# Patient Record
Sex: Male | Born: 1950 | Marital: Married | Smoking: Never smoker
Health system: Southern US, Community
[De-identification: ages and names within clinical notes are randomized; demographics above are authoritative.]

## PROBLEM LIST (undated history)

## (undated) DIAGNOSIS — R7989 Other specified abnormal findings of blood chemistry: Secondary | ICD-10-CM

## (undated) DIAGNOSIS — E291 Testicular hypofunction: Secondary | ICD-10-CM

## (undated) DIAGNOSIS — N529 Male erectile dysfunction, unspecified: Secondary | ICD-10-CM

## (undated) DIAGNOSIS — N400 Enlarged prostate without lower urinary tract symptoms: Secondary | ICD-10-CM

## (undated) DIAGNOSIS — Z8616 Personal history of COVID-19: Secondary | ICD-10-CM

## (undated) DIAGNOSIS — M1990 Unspecified osteoarthritis, unspecified site: Secondary | ICD-10-CM

## (undated) DIAGNOSIS — I10 Essential (primary) hypertension: Secondary | ICD-10-CM

## (undated) DIAGNOSIS — J189 Pneumonia, unspecified organism: Secondary | ICD-10-CM

## (undated) DIAGNOSIS — R339 Retention of urine, unspecified: Secondary | ICD-10-CM

## (undated) DIAGNOSIS — Z973 Presence of spectacles and contact lenses: Secondary | ICD-10-CM

## (undated) DIAGNOSIS — Z9889 Other specified postprocedural states: Secondary | ICD-10-CM

## (undated) DIAGNOSIS — E785 Hyperlipidemia, unspecified: Secondary | ICD-10-CM

## (undated) DIAGNOSIS — N401 Enlarged prostate with lower urinary tract symptoms: Secondary | ICD-10-CM

## (undated) HISTORY — DX: Other specified postprocedural states: Z98.890

## (undated) HISTORY — DX: Male erectile dysfunction, unspecified: N52.9

## (undated) HISTORY — PX: RETINAL DETACHMENT SURGERY: SHX105

## (undated) HISTORY — DX: Personal history of COVID-19: Z86.16

## (undated) HISTORY — PX: PROSTATE BIOPSY: SHX241

## (undated) HISTORY — PX: WISDOM TOOTH EXTRACTION: SHX21

## (undated) HISTORY — DX: Benign prostatic hyperplasia without lower urinary tract symptoms: N40.0

---

## 2017-12-07 DIAGNOSIS — D751 Secondary polycythemia: Secondary | ICD-10-CM

## 2017-12-07 HISTORY — DX: Secondary polycythemia: D75.1

## 2018-07-19 ENCOUNTER — Other Ambulatory Visit: Payer: Self-pay

## 2018-07-19 ENCOUNTER — Ambulatory Visit (HOSPITAL_COMMUNITY)
Admission: EM | Admit: 2018-07-19 | Discharge: 2018-07-19 | Disposition: A | Discharge status: Home / Self Care | Payer: Medicare HMO | Attending: Family Medicine | Admitting: Family Medicine

## 2018-07-19 ENCOUNTER — Encounter (HOSPITAL_COMMUNITY): Payer: Self-pay | Admitting: Emergency Medicine

## 2018-07-19 DIAGNOSIS — I10 Essential (primary) hypertension: Secondary | ICD-10-CM

## 2018-07-19 DIAGNOSIS — R030 Elevated blood-pressure reading, without diagnosis of hypertension: Secondary | ICD-10-CM

## 2018-07-19 HISTORY — DX: Essential (primary) hypertension: I10

## 2018-07-19 HISTORY — DX: Other specified abnormal findings of blood chemistry: R79.89

## 2018-07-19 NOTE — Discharge Instructions (Signed)
Increase dose of lisinopril from 5 mg to 10 mg daily Avoid foods high in salt and consuming excessive amounts of  alcohol.  It is also recommended to get 30 minutes of exercise daily.  DASH eating plan hand-out included.   Begin keeping a blood pressure log Follow up with PCP regarding further evaluation and management of hypertension and low testosterone Return or go to the ED if you have any new or worsening symptoms

## 2018-07-19 NOTE — ED Triage Notes (Signed)
pcp has been treating htn for 8 months.    Patient was prescribed bp med and t gel.  Patient has noticed blood pressure climbing over the last week.

## 2018-07-19 NOTE — ED Provider Notes (Signed)
[facility]   [id number] 07/19/18 Arrival Time: 1806  SUBJECTIVE:  Sergio [name] is a 67 y.o. male who complains of elevated blood pressure x 2 weeks.  Hx of hypertension and low testosterone.  Currently on 5 mg of lisinopril and t-gel.  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 Patient increased dose of lisinopril to 10 mg daily and ran out of t-gel.  Checked BP last Thursday and was 150/98.  Today BP 150/103.  Patient complains of associated headache.  Denies vision changes, chest pain, SOB, peripheral edema, numbness or tingling of the extremities.     ROS: As per HPI.  Past Medical History:  Diagnosis Date  . Hypertension   . Low testosterone    Past Surgical History:  Procedure Laterality Date  . RETINAL DETACHMENT SURGERY Right    No Known Allergies No current facility-administered medications on file prior to encounter.    Current Outpatient Medications on File Prior to Encounter  Medication Sig Dispense Refill  . lisinopril (PRINIVIL,ZESTRIL) 5 MG tablet Take 5 mg by mouth daily.    . NON FORMULARY Testosterone gel    . testosterone (ANDROGEL) 50 MG/5GM (1%) GEL Place onto the skin daily. Testosterone gel     Social History   Socioeconomic History  . Marital status: Unknown    Spouse name: Not on file  . Number of children: Not on file  . Years of education: Not on file  . Highest education level: Not on file  Occupational History  . Not on file  Social Needs  . Financial resource strain: Not on file  . Food insecurity:    Worry: Not on file    Inability: Not on file  . Transportation needs:    Medical: Not on file    Non-medical: Not on file  Tobacco Use  . Smoking status: Never Smoker  Substance and Sexual Activity  . Alcohol use: Never    Frequency: Never  . Drug use: Never  . Sexual activity: Not on file  Lifestyle  . Physical activity:    Days per week: Not on file    Minutes per  session: Not on file  . Stress: Not on file  Relationships  . Social connections:    Talks on phone: Not on file    Gets together: Not on file    Attends religious service: Not on file    Active member of club or organization: Not on file    Attends meetings of clubs or organizations: Not on file    Relationship status: Not on file  . Intimate partner violence:    Fear of current or ex partner: Not on file    Emotionally abused: Not on file    Physically abused: Not on file    Forced sexual activity: Not on file  Other Topics Concern  . Not on file  Social History Narrative  . Not on file   Family History  Problem Relation Age of Onset  . Heart failure Mother   . Heart failure Father     OBJECTIVE:  Vitals:   07/19/18 1835 07/19/18 1913  BP: (!) 152/103 (!) 151/100  Pulse: 67   Resp: 18   Temp: 97.7 F (36.5 C)   TempSrc: Oral   SpO2: 96%     General appearance: alert; no distress Eyes: PERRLA; EOMI HENT: normocephalic; atraumatic Neck: supple with FROM Lungs: clear to auscultation bilaterally Heart:  regular rate and rhythm.  Radial pulses 2+ symmetrical bilaterally Extremities: no edema; symmetrical with no gross deformities Skin: warm and dry Neurologic: CN 2-12 grossly intact; negative pronator drift; finger to nose without difficulty; normal gait; strength and sensation intact bilaterally about the upper and lower extremities Psychological: alert and cooperative; anxious mood and affect  ASSESSMENT & PLAN:  1. Essential hypertension   2. Elevated blood pressure reading     No orders of the defined types were placed in this encounter.  Increase dose of lisinopril from 5 mg to 10 mg daily Avoid foods high in salt and consuming excessive amounts of  alcohol.  It is also recommended to get 30 minutes of exercise daily.  DASH eating plan hand-out included.   Begin keeping a blood pressure log Follow up with PCP regarding further evaluation and management of  hypertension and low testosterone Return or go to the ED if you have any new or worsening symptoms  Reviewed expectations re: course of current medical issues. Questions answered. Outlined signs and symptoms indicating need for more acute intervention. Patient verbalized understanding. After Visit Summary given.   Rennis HardingWurst, Jimy Gates, PA-C 07/19/18 1918

## 2018-09-29 ENCOUNTER — Encounter: Payer: Self-pay | Admitting: Hematology and Oncology

## 2018-09-29 ENCOUNTER — Telehealth: Payer: Self-pay | Admitting: Hematology and Oncology

## 2018-09-29 NOTE — Telephone Encounter (Signed)
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 Pt has been cld and scheduled to see Dr. Caron Presume on 11/18 at 11am. Letter mailed to the pt.

## 2018-10-24 ENCOUNTER — Telehealth: Payer: Self-pay | Admitting: Hematology and Oncology

## 2018-10-24 ENCOUNTER — Inpatient Hospital Stay: Payer: Medicare HMO

## 2018-10-24 ENCOUNTER — Inpatient Hospital Stay: Payer: Medicare HMO | Attending: Hematology and Oncology | Admitting: Hematology and Oncology

## 2018-10-24 VITALS — BP 142/96 | HR 66 | Temp 98.1°F | Resp 18 | Wt 181.1 lb

## 2018-10-24 DIAGNOSIS — Z79899 Other long term (current) drug therapy: Secondary | ICD-10-CM | POA: Diagnosis not present

## 2018-10-24 DIAGNOSIS — M1990 Unspecified osteoarthritis, unspecified site: Secondary | ICD-10-CM | POA: Insufficient documentation

## 2018-10-24 DIAGNOSIS — E039 Hypothyroidism, unspecified: Secondary | ICD-10-CM | POA: Diagnosis not present

## 2018-10-24 DIAGNOSIS — D751 Secondary polycythemia: Secondary | ICD-10-CM | POA: Diagnosis present

## 2018-10-24 DIAGNOSIS — I10 Essential (primary) hypertension: Secondary | ICD-10-CM | POA: Diagnosis not present

## 2018-10-24 LAB — CBC WITH DIFFERENTIAL (CANCER CENTER ONLY)
Abs Immature Granulocytes: 0.04 10*3/uL (ref 0.00–0.07)
Basophils Absolute: 0 10*3/uL (ref 0.0–0.1)
Basophils Relative: 0 %
Eosinophils Absolute: 0.1 10*3/uL (ref 0.0–0.5)
Eosinophils Relative: 1 %
HCT: 55.5 % — ABNORMAL HIGH (ref 39.0–52.0)
Hemoglobin: 19.3 g/dL — ABNORMAL HIGH (ref 13.0–17.0)
Immature Granulocytes: 1 %
Lymphocytes Relative: 29 %
Lymphs Abs: 2.3 10*3/uL (ref 0.7–4.0)
MCH: 29.3 pg (ref 26.0–34.0)
MCHC: 34.8 g/dL (ref 30.0–36.0)
MCV: 84.2 fL (ref 80.0–100.0)
Monocytes Absolute: 0.5 10*3/uL (ref 0.1–1.0)
Monocytes Relative: 7 %
Neutro Abs: 5 10*3/uL (ref 1.7–7.7)
Neutrophils Relative %: 62 %
Platelet Count: 174 10*3/uL (ref 150–400)
RBC: 6.59 MIL/uL — ABNORMAL HIGH (ref 4.22–5.81)
RDW: 14.2 % (ref 11.5–15.5)
WBC Count: 8 10*3/uL (ref 4.0–10.5)
nRBC: 0 % (ref 0.0–0.2)

## 2018-10-24 LAB — COMPREHENSIVE METABOLIC PANEL
ALT: 31 U/L (ref 0–44)
AST: 26 U/L (ref 15–41)
Albumin: 3.9 g/dL (ref 3.5–5.0)
Alkaline Phosphatase: 52 U/L (ref 38–126)
Anion gap: 10 (ref 5–15)
BUN: 16 mg/dL (ref 8–23)
CO2: 24 mmol/L (ref 22–32)
Calcium: 9.2 mg/dL (ref 8.9–10.3)
Chloride: 100 mmol/L (ref 98–111)
Creatinine, Ser: 1.05 mg/dL (ref 0.61–1.24)
GFR calc Af Amer: >60 mL/min (ref >60)
GFR calc non Af Amer: >60 mL/min (ref >60)
Glucose, Bld: 102 mg/dL — ABNORMAL HIGH (ref 70–99)
Potassium: 3.5 mmol/L (ref 3.5–5.1)
Sodium: 134 mmol/L — ABNORMAL LOW (ref 135–145)
Total Bilirubin: 1.5 mg/dL — ABNORMAL HIGH (ref 0.3–1.2)
Total Protein: 7.1 g/dL (ref 6.5–8.1)

## 2018-10-24 NOTE — Patient Instructions (Signed)
We discussed the results of your prior laboratory studies from August.  At that time her hemoglobin was elevated.  Repeat laboratory studies were requested for today to include a complete blood count, metabolic panel, and testosterone level.  Continue to take testosterone gel as previously directed (one half the dose).  Barring any unforeseen complications, your next scheduled doctor visit to discuss those results and possible treatment is on November 07, 2018.  Please do not hesitate to call in the interim should any new or untoward problems arise.  Happy Thanksgiving! Debbe Odeaichard Ruben MD

## 2018-10-24 NOTE — Progress Notes (Signed)
Outpatient Hematology/Oncology Initial Consultation  Patient Name:  Sergio [name]  DOB: 1951-08-27   Date of Service: October 24, 2018  Referring Provider: Aida Puffer MD [address] 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  History Present Illness: Sergio [name] is a 67 year old resident of [place] this past medical history is significant for primary hypertension; degenerative joint disease involving the fingers; and hypogonadism/low testosterone.  His primary care physician is Dr. Aida Puffer.  He is alone at this first visit.  Since February, he has been on AndroGel 1 mg daily.  Sukhraj was aware that his hemoglobin was elevated nearly 3 months ago.  At that time, on August 01, 2018: A complete blood count showed hemoglobin 19.3 hematocrit 56.5 MCV 85.7 MCH 29.3 RDW 15.2 WBC 7.8 with 60% neutrophils 30% lymphocytes 7% monocytes 2% eosinophils 1% basophil; platelets 172,000.  Vitamin B12 1654.  Serum iron 94 TIBC 349 iron saturation 27%. TSH 1.8. Total testosterone 441.He has no peripheral arterial venous thromboembolic disease. He has no coronary artery or cerebrovascular disease.   Antwoine denies any appetite or weight deficit.  He has no visual changes or hearing deficit.  There is no cough, sore throat, or orthopnea.  He reports no dyspnea either at rest or on exertion.  He has no pain or difficulty in swallowing.  No fever, shaking chills, sweats, or flulike symptoms are evident.  He denies heartburn or indigestion.  There is no nausea, vomiting, diarrhea, or constipation.  He has no melena or bright red blood per rectum.  No urinary frequency, urgency, hematuria, dysuria are reported.  Has no swelling of his ankles.  He is chronic but stable degenerative joint pain  in his fingers over the past year.  There is no blood disorder or bleeding tendency.   There is with this background he presents now for further diagnostic and therapeutic recommendations in the setting of erythrocytosis as outlined above.  Past Medical History:  Diagnosis Date  . Hypertension   . Low testosterone    Past Surgical History:  Procedure Laterality Date  . RETINAL DETACHMENT SURGERY Right    Family History  Problem Relation Age of Onset  . Heart failure Mother   . Heart failure Father   Mother: Deceased: Age 9 years: CHF Father: Deceased: 51s: CHF/HTN He has no brothers. Sisters (2): DJD/HTN  Social History   Socioeconomic History  . Marital status: Unknown    Spouse name: Not on file  . Number of children: Not on file  . Years of education: Not on file  . Highest education level: Not on file  Occupational History  . Not on file  Social Needs  . Financial resource strain: Not on file  . Food insecurity:    Worry: Not on file    Inability: Not on file  . Transportation needs:    Medical: Not on file    Non-medical: Not on file  Tobacco Use  . Smoking status: Never Smoker  Substance and Sexual Activity  . Alcohol use: Never    Frequency: Never  . Drug use: Never  . Sexual activity: Not on file  Lifestyle  . Physical activity:    Days per week: Not on file    Minutes per session: Not on file  . Stress: Not on file  Relationships  .  Social connections:    Talks on phone: Not on file    Gets together: Not on file    Attends religious service: Not on file    Active member of club or organization: Not on file    Attends meetings of clubs or organizations: Not on file    Relationship status: Not on file  . Intimate partner violence:    Fear of current or ex partner: Not on file    Emotionally abused: Not on file    Physically abused: Not on file    Forced sexual activity: Not on file  Other Topics Concern  . Not on file  Social History Narrative    . Not on file  Although retired, he is an Engineer, site. Keagen has been married for the past 44 years. He has 2 children. With the exception of hypothyroid disease, his children are healthy. He is a lifetime non-smoker. In his 69s he was a social drinker. He stopped drinking completely 45 years ago.   He reports no recreational drug use.  Transfusion History: No prior transfusion  Exposure History: He has no known exposure to toxic chemicals, radiation, or pesticides  Allergies:  He has no known medical allergies He has no food or seasonal allergies  Current Outpatient Medications on File Prior to Visit  Medication Sig  . hydrochlorothiazide (HYDRODIURIL) 25 MG tablet TAKE 1 TABLET BY MOUTH DAILY WITH LISINOPRIL  . lisinopril (PRINIVIL,ZESTRIL) 10 MG tablet Take 10 mg by mouth daily.  . Multiple Vitamin (MULTIVITAMIN) tablet Take 4 tablets by mouth daily.  . NON FORMULARY Testosterone gel  . testosterone (ANDROGEL) 50 MG/5GM (1%) GEL Place onto the skin daily. Testosterone gel   No current facility-administered medications on file prior to visit.     Review of Systems: Constitutional: No fever, sweats, or shaking chills. No appetite or weight deficit. Skin: No rash, scaling, sores, lumps, or jaundice; plethoric facies. HEENT: No visual changes or hearing deficit. Pulmonary: No unusual cough, sore throat, or orthopnea. Cardiovascular: No coronary artery disease, angina, or myocardial infarction.  No cardiac dysrhythmia. essential hypertension.  No dyslipidemia. Gastrointestinal: No indigestion, dysphagia, abdominal pain, diarrhea, or constipation.  No change in bowel habits. Genitourinary: No urinary frequency, urgency, hematuria, or dysuria; hypogonadism. Musculoskeletal: No new arthralgias or myalgias; no joint swelling, pain, or instability.  Chronic painful fingers for the past 12 months. Hematologic: No bleeding tendency or easy bruisability; secondary  erythrocytosis. Endocrine: No intolerance to hot or cold; no thyroid disease or diabetes mellitus. Vascular: No peripheral arterial or venous thromboembolic disease. Psychological: No anxiety, depression, or mood changes; no mental health illnesses. Neurological: No dizziness, lightheadedness, syncope, or near syncopal episodes; no numbness or tingling in the fingers or toes.  Physical Examination: Vital Signs: There is no height or weight on file to calculate BSA.  Vitals:   10/24/18 1145  BP: (!) 142/96  Pulse: 66  Resp: 18  Temp: 98.1 F (36.7 C)  SpO2: 99%    Filed Weights   10/24/18 1145  Weight: 181 lb 1.6 oz (82.1 kg)  ECOG PERFORMANCE STATUS: 0 Constitutional:  Mandel Seiden is fully nourished and developed.  He looks age appropriate.  He is friendly and cooperative without respiratory compromise at rest. Skin: No rashes, scaling, dryness, jaundice, or itching.  His face and chest wall are plethoric. HEENT: Head is normocephalic and atraumatic.  Pupils are equal round and reactive to light and accommodation.  Sclerae are anicteric.  Conjunctivae are pink.  No sinus  tenderness nor oropharyngeal lesions.  Lips without cracking or peeling; tongue without mass, inflammation, or nodularity.  Mucous membranes are moist. Neck: Supple and symmetric.  No jugular venous distention or thyromegaly.  Trachea is midline. Lymphatics: No cervical or supraclavicular lymphadenopathy.  No epitrochlear, axillary, or inguinal lymphadenopathy is appreciated. Respiratory/chest: Thorax is symmetrical.  Breath sounds are clear to auscultation and percussion.  Normal excursion and respiratory effort. Back: Symmetric without deformity or tenderness. Cardiovascular: Heart rate and rhythm are regular without murmurs. Gastrointestinal: Abdomen is soft, nontender; no organomegaly.  Bowel sounds are normoactive.  No masses are appreciated. Genitourinary: Normal external male genitalia. Rectal examination:  Not performed. Extremities: In the lower extremities, there is no asymmetric swelling, erythema, tenderness, or cord formation.  No clubbing, cyanosis, nor edema. Hematologic: No petechiae, hematomas, or ecchymoses. Psychological:  He is oriented to person, place, and time; normal affect, memory, and cognition. Neurological: There are no gross neurologic deficits.  Laboratory Results: I have reviewed the data as listed: October 24, 2018  Ref Range & Units 13:10  WBC Count 4.0 - 10.5 K/uL 8.0   RBC 4.22 - 5.81 MIL/uL 6.59High    Hemoglobin 13.0 - 17.0 g/dL 19.3High    HCT 39.0 - 52.0 % 55.5High    MCV 80.0 - 100.0 fL 84.2   MCH 26.0 - 34.0 pg 29.3   MCHC 30.0 - 36.0 g/dL 29.5   RDW 62.1 - 30.8 % 14.2   Platelet Count 150 - 400 K/uL 174   nRBC 0.0 - 0.2 % 0.0   Neutrophils Relative % % 62   Neutro Abs 1.7 - 7.7 K/uL 5.0   Lymphocytes Relative % 29   Lymphs Abs 0.7 - 4.0 K/uL 2.3   Monocytes Relative % 7   Monocytes Absolute 0.1 - 1.0 K/uL 0.5   Eosinophils Relative % 1   Eosinophils Absolute 0.0 - 0.5 K/uL 0.1   Basophils Relative % 0   Basophils Absolute 0.0 - 0.1 K/uL 0.0   Immature Granulocytes % 1   Abs Immature Granulocytes 0.00 - 0.07 K/uL 0.04     Ref Range & Units 13:10  Sodium 135 - 145 mmol/L 134Low    Potassium 3.5 - 5.1 mmol/L 3.5   Chloride 98 - 111 mmol/L 100   CO2 22 - 32 mmol/L 24   Glucose, Bld 70 - 99 mg/dL 657QION    BUN 8 - 23 mg/dL 16   Creatinine, Ser 6.29 - 1.24 mg/dL 5.28   Calcium 8.9 - 41.3 mg/dL 9.2   Total Protein 6.5 - 8.1 g/dL 7.1   Albumin 3.5 - 5.0 g/dL 3.9   AST 15 - 41 U/L 26   ALT 0 - 44 U/L 31   Alkaline Phosphatase 38 - 126 U/L 52   Total Bilirubin 0.3 - 1.2 mg/dL 2.4MWNU    GFR calc non Af Amer >60 mL/min >60   GFR calc Af Amer >60 mL/min >60    Summary/Assessment: In the setting of erythrocytosis, 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  Under the  direction of Dr. Clarene Duke, on August 01, 2018: A complete blood count showed hemoglobin 19.3 hematocrit 56.5 MCV 85.7 MCH 29.3 RDW 15.2 WBC 7.8 with 60% neutrophils 30% lymphocytes 7% monocytes 2% eosinophils 1% basophil; platelets 172,000.  Vitamin B12 1654.  Serum iron 94 TIBC 349 iron saturation 27%. TSH 1.8. Total testosterone 441.He has no peripheral arterial venous thromboembolic disease. He has no coronary artery or cerebrovascular  disease. He has no visual changes or hearing deficit.  He has no bleeding tendency.    Chrissie NoaWilliam denies any appetite or weight deficit. He denies headache, dizziness, lightheadedness, syncope, or near syncopal episodes.   There is no cough, sore throat, or orthopnea.  He reports no dyspnea either at rest or on exertion.  He has no pain or difficulty in swallowing.  No fever, shaking chills, sweats, or flulike symptoms are evident.  He denies heartburn or indigestion.  There is no nausea, vomiting, diarrhea, or constipation.  He has no melena or bright red blood per rectum.  No urinary frequency, urgency, hematuria, dysuria are reported.  He has no swelling of his ankles.  He has chronic but stable degenerative joint pain in his fingers over the past year.  There is no blood disorder or bleeding tendency.  He denies numbness or tingling in his fingers or toes.  His other comorbid problems include primary hypertension; degenerative joint disease involving the fingers; and hypogonadism/low testosterone. Since February, he has been on AndroGel 1 mg daily.  He was instructed to reduce the AndroGel by 50%.  Chrissie NoaWilliam was aware that his hemoglobin was elevated nearly 3 months ago.    Recommendation/Plan: The results of his laboratory studies were not available at the time of discharge.  They will be discussed in detail at the time of his next visit.  A repeat complete blood count, testosterone level, and erythropoietin level are pending.  The indication for therapeutic phlebotomy and  secondary erythrocytosis is somewhat dependent upon the symptoms provoked.  In general, it is recommended that he continue aspirin: 81 mg once daily.  Because he recently was instructed to reduce the dose of AndroGel by 50%, this may favorably affect his erythrocytosis.   Barring any unforeseen complications, his next scheduled doctor visit to discuss these results with recommendations is on December 2.  He was advised to call us in the interim should any new or untoward problems arise.  The total time spent discussing his most recent laboratory studies from August, physical examination, and methodology for evaluating erythrocytosis with recommendations was 50 minutes. At least 50% of that time was spent in face to face discussion, counseling, and answering questions. There was ample time allotted to answer all questions.  This note was dictated using voice activated technology/software.  Unfortunately, typographical errors are not uncommon, and transcription is subject to mistakes and regrettably misinterpretation.  If necessary, clarification of the above information can be discussed with me at any time.  Thank you Dr. Clarene DukeLittle for allowing my participation in the care of [facility].  Please do not hesitate to call should any questions arise regarding this initial evaluation and discussion.  FOLLOW UP: AS DIRECTED  cc:          Aida PufferJames Little MD       Toni Arthursichard H Seamus Warehime, MD  Hematology/Oncology [facility] [address] [place], [place] [address] Office: [phone] Main: [phone]

## 2018-10-24 NOTE — Telephone Encounter (Signed)
Printed calendar and avs. °

## 2018-10-25 DIAGNOSIS — D751 Secondary polycythemia: Secondary | ICD-10-CM | POA: Insufficient documentation

## 2018-10-25 LAB — ERYTHROPOIETIN: Erythropoietin: 11.3 m[IU]/mL (ref 2.6–18.5)

## 2018-10-26 LAB — TESTOSTERONE,FREE AND TOTAL
Testosterone, Free: 7.2 pg/mL (ref 6.6–18.1)
Testosterone: 295 ng/dL (ref 264–916)

## 2018-11-06 ENCOUNTER — Other Ambulatory Visit: Payer: Self-pay | Admitting: Hematology and Oncology

## 2018-11-06 DIAGNOSIS — D751 Secondary polycythemia: Secondary | ICD-10-CM

## 2018-11-07 ENCOUNTER — Encounter: Payer: Self-pay | Admitting: Hematology and Oncology

## 2018-11-07 ENCOUNTER — Inpatient Hospital Stay: Payer: Medicare HMO

## 2018-11-07 ENCOUNTER — Inpatient Hospital Stay: Payer: Medicare HMO | Attending: Hematology and Oncology | Admitting: Hematology and Oncology

## 2018-11-07 VITALS — BP 141/98 | HR 74 | Temp 98.6°F | Resp 17 | Ht 69.0 in | Wt 182.4 lb

## 2018-11-07 DIAGNOSIS — M1990 Unspecified osteoarthritis, unspecified site: Secondary | ICD-10-CM | POA: Insufficient documentation

## 2018-11-07 DIAGNOSIS — D751 Secondary polycythemia: Secondary | ICD-10-CM | POA: Insufficient documentation

## 2018-11-07 DIAGNOSIS — Z79899 Other long term (current) drug therapy: Secondary | ICD-10-CM | POA: Diagnosis not present

## 2018-11-07 DIAGNOSIS — E291 Testicular hypofunction: Secondary | ICD-10-CM | POA: Insufficient documentation

## 2018-11-07 DIAGNOSIS — I10 Essential (primary) hypertension: Secondary | ICD-10-CM | POA: Insufficient documentation

## 2018-11-07 LAB — CBC WITH DIFFERENTIAL (CANCER CENTER ONLY)
Abs Immature Granulocytes: 0.04 10*3/uL (ref 0.00–0.07)
Basophils Absolute: 0 10*3/uL (ref 0.0–0.1)
Basophils Relative: 0 %
Eosinophils Absolute: 0.1 10*3/uL (ref 0.0–0.5)
Eosinophils Relative: 1 %
HCT: 56.8 % — ABNORMAL HIGH (ref 39.0–52.0)
Hemoglobin: 19.9 g/dL — ABNORMAL HIGH (ref 13.0–17.0)
Immature Granulocytes: 1 %
Lymphocytes Relative: 26 %
Lymphs Abs: 2.1 10*3/uL (ref 0.7–4.0)
MCH: 29.3 pg (ref 26.0–34.0)
MCHC: 35 g/dL (ref 30.0–36.0)
MCV: 83.5 fL (ref 80.0–100.0)
Monocytes Absolute: 0.6 10*3/uL (ref 0.1–1.0)
Monocytes Relative: 7 %
Neutro Abs: 5.4 10*3/uL (ref 1.7–7.7)
Neutrophils Relative %: 65 %
Platelet Count: 186 10*3/uL (ref 150–400)
RBC: 6.8 MIL/uL — ABNORMAL HIGH (ref 4.22–5.81)
RDW: 14.4 % (ref 11.5–15.5)
WBC Count: 8.3 10*3/uL (ref 4.0–10.5)
nRBC: 0 % (ref 0.0–0.2)

## 2018-11-07 LAB — BILIRUBIN, FRACTIONATED(TOT/DIR/INDIR)
Bilirubin, Direct: 0.2 mg/dL (ref 0.0–0.2)
Indirect Bilirubin: 1.2 mg/dL — ABNORMAL HIGH (ref 0.3–0.9)
Total Bilirubin: 1.4 mg/dL — ABNORMAL HIGH (ref 0.3–1.2)

## 2018-11-07 NOTE — Patient Instructions (Signed)
We discussed in detail the results of her laboratory studies from November 18.  Those results confirm the presence of a markedly elevated red blood cell volume (increased hemoglobin) in spite of the fact that she decreased dose of AndroGel 3 months ago..  Your testosterone level is normal.  Because your total bilirubin was mildly elevated (1.5), it will be repeated with fractionation of total bilirubin into direct/indirect components.  Those results will be discussed the time of your next visit.  Start aspirin: 81 mg once daily because of the increased red blood cell mass/volume.  Morning a little.  Laboratory studies will be obtained today to attempt to identify an underlying bone marrow disorder.  Because your red cell mass is increased relative to the plasma in your vascular compartment, hydrochlorothiazide may be contributing in small part to your increased red blood cell concentration.  You should consider discussing this with Dr. Clarene DukeLittle including your improving, but still elevated blood pressure (141/98).  If not already considered, a home ambulatory blood pressure cuff is strongly recommended with self-monitoring.   A therapeutic phlebotomy will be scheduled for Friday, December 6.  Barring any unforeseen complications, your next scheduled doctor visit with laboratory studies is on December 16.  Please do not hesitate to call in the interim should any new or untoward problems arise.  All the best of good wishes through the holiday season!

## 2018-11-07 NOTE — Progress Notes (Signed)
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  His primary care physician is Dr. Tamsen Roers.  He is alone at this visit.  Since February, he was started on AndroGel daily.  Sergio [name] was aware that his hemoglobin was elevated nearly 3 months ago. His dose of AndroGel was decreased by 50% in August to AndroGel: 50 mg once daily.  At that time, on August 01, 2018: A complete blood count showed hemoglobin 19.3 hematocrit 56.5 MCV 85.7 MCH 29.3 RDW 15.2 WBC 7.8 with 60% neutrophils 30% lymphocytes 7% monocytes 2% eosinophils 1% basophil; platelets 172,000.  Vitamin B12 1654.  Serum iron 94 TIBC 349 iron saturation 27%. TSH 1.8. Total testosterone 441. He has no prior peripheral arterial or venous thromboembolic disease. He has no known coronary artery or cerebrovascular disease.  Because of his elevated red blood cell volume, 3 months ago, his antral gel was decreased by 50%.  He now uses AndroGel: 0.5 mg daily.  Although his blood pressure remains elevated, it has improved since August.  He continues on lisinopril/hydrochlorothiazide.  He is not taking aspirin.  Interval History: In the interim since his last visit, he reports no new problems or complaints.  He denies any appetite or weight deficit.  He has no visual changes or hearing deficit. He has no unusual headache,  dizziness, lightheadedness, syncope, or near syncopal episodes.  He is taking no new medications.  There is no cough, sore throat, or orthopnea.  He reports no dyspnea either at rest or on exertion.  He has no pain or difficulty in swallowing.  No fever, shaking chills, sweats, or flulike symptoms are evident.  He has occasional dyspepsia at night.  There is no nausea, vomiting, diarrhea, or constipation.  He has no melena or bright red blood per rectum.  No urinary frequency, urgency, hematuria, or dysuria are reported.  He has no swelling of his ankles.  He has chronic but stable degenerative joint pain in his fingers over the past year.  There is no known blood disorder or bleeding tendency in his immediate family.     Past Medical History:  Diagnosis Date  . Hypertension   . Low testosterone    Past Surgical History:  Procedure Laterality Date  . RETINAL DETACHMENT SURGERY Right    Allergies:  He has no known medical allergies He has no food or seasonal allergies  Current Outpatient Medications on File Prior to Visit  Medication Sig  . hydrochlorothiazide (HYDRODIURIL) 25 MG tablet TAKE 1 TABLET BY MOUTH DAILY WITH LISINOPRIL  . lisinopril (PRINIVIL,ZESTRIL) 10 MG tablet Take 10 mg by mouth daily.  . Multiple Vitamin (MULTIVITAMIN) tablet Take 4 tablets by mouth daily.  . NON FORMULARY Testosterone gel  . testosterone (ANDROGEL) 50 MG/5GM (1%) GEL Place onto the skin daily. Testosterone gel   No current facility-administered medications on file prior to visit.     Review of Systems: Constitutional: No fever, sweats, or shaking chills. No appetite or  weight deficit. Skin: No rash, scaling, sores, lumps, or jaundice; plethoric facies. HEENT: No visual changes or hearing deficit. Pulmonary: No unusual cough, sore throat, or orthopnea. Cardiovascular: No coronary artery disease, angina, or myocardial infarction.  No cardiac dysrhythmia. essential hypertension.  No  dyslipidemia. Gastrointestinal: No indigestion, dysphagia, abdominal pain, diarrhea, or constipation.  No change in bowel habits. Genitourinary: No urinary frequency, urgency, hematuria, or dysuria; hypogonadism. Musculoskeletal: No new arthralgias or myalgias; no joint swelling, pain, or instability.  Chronic painful fingers for the past 12 months. Hematologic: No bleeding tendency or easy bruisability; secondary erythrocytosis. Endocrine: No intolerance to hot or cold; no thyroid disease or diabetes mellitus. Vascular: No peripheral arterial or venous thromboembolic disease. Psychological: No anxiety, depression, or mood changes; no mental health illnesses. Neurological: No dizziness, lightheadedness, syncope, or near syncopal episodes; no numbness or tingling in the fingers or toes.  Physical Examination: Vital Signs: Body surface area is 2.01 meters squared.  Vitals:   11/07/18 0856  BP: (!) 141/98  Pulse: 74  Resp: 17  Temp: 98.6 F (37 C)  SpO2: 96%    Filed Weights   11/07/18 0856  Weight: 182 lb 6.4 oz (82.7 kg)  ECOG PERFORMANCE STATUS: 0 Constitutional:  Sergio [name] is fully nourished and developed.  He looks age appropriate.  He is friendly and cooperative without respiratory compromise at rest. Skin: No rashes, scaling, dryness, jaundice, or itching.  His face and chest wall are plethoric. HEENT: Head is normocephalic and atraumatic.  Pupils are equal round and reactive to light and accommodation.  Sclerae are anicteric.  Conjunctivae are pink.  No sinus tenderness nor oropharyngeal lesions.  Lips without cracking or peeling; tongue without mass, inflammation, or nodularity.  Mucous membranes are moist. Neck: Supple and symmetric.  No jugular venous distention or thyromegaly.  Trachea is midline. Lymphatics: No cervical or supraclavicular lymphadenopathy.  No epitrochlear, axillary, or inguinal lymphadenopathy is appreciated. Respiratory/chest: Thorax is symmetrical.   Breath sounds are clear to auscultation and percussion.  Normal excursion and respiratory effort. Back: Symmetric without deformity or tenderness. Cardiovascular: Heart rate and rhythm are regular without murmurs. Gastrointestinal: Abdomen is soft, nontender; no organomegaly.  Bowel sounds are normoactive.  No masses are appreciated. Extremities: In the lower extremities, there is no asymmetric swelling, erythema, tenderness, or cord formation.  No clubbing, cyanosis, nor edema. Hematologic: No petechiae, hematomas, or ecchymoses. Psychological:  He is oriented to person, place, and time; normal affect, memory, and cognition. Neurological: There are no gross neurologic deficits.  Laboratory Results: I have reviewed the data as listed: October 24, 2018  Ref Range & Units 13:10  WBC Count 4.0 - 10.5 K/uL 8.0   RBC 4.22 - 5.81 MIL/uL 6.59High    Hemoglobin 13.0 - 17.0 g/dL 19.3High    HCT 39.0 - 52.0 % 55.5High    MCV 80.0 - 100.0 fL 84.2   MCH 26.0 - 34.0 pg 29.3   MCHC 30.0 - 36.0 g/dL 34.8   RDW 11.5 - 15.5 % 14.2   Platelet Count 150 - 400 K/uL 174   nRBC 0.0 - 0.2 % 0.0   Neutrophils Relative % % 62   Neutro Abs 1.7 - 7.7 K/uL 5.0   Lymphocytes Relative % 29   Lymphs Abs 0.7 - 4.0 K/uL 2.3   Monocytes Relative % 7   Monocytes Absolute 0.1 - 1.0 K/uL 0.5   Eosinophils Relative % 1   Eosinophils Absolute 0.0 - 0.5 K/uL 0.1   Basophils Relative % 0  Basophils Absolute 0.0 - 0.1 K/uL 0.0   Immature Granulocytes % 1   Abs Immature Granulocytes 0.00 - 0.07 K/uL 0.04     Ref Range & Units 13:10  Sodium 135 - 145 mmol/L 134Low    Potassium 3.5 - 5.1 mmol/L 3.5   Chloride 98 - 111 mmol/L 100   CO2 22 - 32 mmol/L 24   Glucose, Bld 70 - 99 mg/dL 102High    BUN 8 - 23 mg/dL 16   Creatinine, Ser 0.61 - 1.24 mg/dL 1.05   Calcium 8.9 - 10.3 mg/dL 9.2   Total Protein 6.5 - 8.1 g/dL 7.1   Albumin 3.5 - 5.0 g/dL 3.9   AST 15 - 41 U/L 26   ALT 0 - 44 U/L 31   Alkaline Phosphatase 38  - 126 U/L 52   Total Bilirubin 0.3 - 1.2 mg/dL 1.5High    GFR calc non Af Amer >60 mL/min >60   GFR calc Af Amer >60 mL/min >60   Erythropoietin 11.3 Total testosterone 295 Free testosterone 7.2  Summary/Assessment: Since February, he was started on AndroGel daily.  Sergio [name] was aware that his hemoglobin was elevated nearly 3 months ago. His dose of AndroGel was decreased by 50% in August to AndroGel: 50 mg once daily. On August 01, 2018: A complete blood count showed hemoglobin 19.3 hematocrit 56.5 MCV 85.7 MCH 29.3 RDW 15.2 WBC 7.8 with 60% neutrophils 30% lymphocytes 7% monocytes 2% eosinophils 1% basophil; platelets 172,000.  Vitamin B12 1654.  Serum iron 94 TIBC 349 iron saturation 27%. TSH 1.8. Total testosterone 441. He has no prior peripheral arterial or venous thromboembolic disease. He has no known coronary artery or cerebrovascular disease.  Because of his elevated red blood cell volume, 3 months ago, his antral gel was decreased by 50%.  He now uses AndroGel: 50 mg daily.  Although his blood pressure remains elevated, it has improved since August.  He continues on lisinopril/hydrochlorothiazide.  He is not taking aspirin.  In the interim since his last visit, he reports no new problems or complaints.  He denies any appetite or weight deficit.  He has no visual changes or hearing deficit. He has no unusual headache, dizziness, lightheadedness, syncope, or near syncopal episodes.  He is taking no new medications.  There is no cough, sore throat, or orthopnea.  He reports no dyspnea either at rest or on exertion.  He has no pain or difficulty in swallowing.  No fever, shaking chills, sweats, or flulike symptoms are evident.  He has occasional dyspepsia at night.  There is no nausea, vomiting, diarrhea, or constipation.  He has no melena or bright red blood per rectum.  No urinary frequency, urgency, hematuria, or dysuria are reported.  He has no swelling of his ankles.  He has chronic but stable  degenerative joint pain in his fingers over the past year.  There is no known blood disorder or bleeding tendency in his immediate family.     His other comorbid problems include primary hypertension; degenerative joint disease involving the fingers; and hypogonadism/low testosterone. Since February, he has been on AndroGel daily.  He was instructed to reduce the AndroGel by 50%.  Sergio [name] was aware that his hemoglobin was elevated nearly 3 months ago.    Recommendation/Plan: The results of his laboratory studies were reviewed and discussed in detail.  Although his dose of AndroGel was decreased by 50%, his hemoglobin/hematocrit remains relatively unchanged.  His total testosterone level is now low-normal and appears  to have decreased appropriately.  His erythropoietin level was normal.  In spite of this, JAK 2 V617F w/reflex MPL, CALR, Exon 12 were requested to exclude an underlying myeloproliferative disorder.  Although the indication for therapeutic phlebotomy in secondary erythrocytosis is somewhat dependent upon the symptoms provoked, his hemoglobin 20 g/dL, pushes me in the direction of a therapeutic phlebotomy.  Those arrangements have been scheduled for December 6.  Because his total bilirubin was 1.5, a repeat was requested with fractionation.  Generally, an isolated total bilirubin in this range is reflective of Gilbert's syndrome.  He has no history to verify this however. Those results are pending.  It is recommended for the present that he take aspirin: 81 mg once daily given his elevated red blood cell volume.  Because his red cell mass is increased relative to the plasma volume within his vascular compartment, hydrochlorothiazide may be contributing in small part to his increased red blood cell concentration. This can be discussed with Dr. Rex Kras.  He was encouraged to follow Dr. Eddie Dibbles direction with regard to blood pressure medication.  If not already considered, a home ambulatory  blood pressure kit was strongly recommended for blood pressure self-monitoring.  His blood pressure continues to be elevated.  Barring any unforeseen complications, his next scheduled doctor visit to discuss these results with recommendations is on December 16.  He was informed that another provider will be seeing him, since I am leaving the practice.  He was advised to call us in the interim should any new or untoward problems arise.  This note was dictated using voice activated technology/software.  Unfortunately, typographical errors are not uncommon, and transcription is subject to mistakes and regrettably misinterpretation.  If necessary, clarification of the above information can be discussed with me at any time.  FOLLOW UP: AS DIRECTED  cc:          Tamsen Roers MD       Henreitta Leber, MD  Hematology/Oncology [facility] [address] [place], [place] [address] Office: [phone] CVKF: [phone]

## 2018-11-11 ENCOUNTER — Inpatient Hospital Stay: Payer: Medicare HMO

## 2018-11-11 VITALS — BP 138/92 | HR 74 | Temp 97.7°F | Resp 16

## 2018-11-11 DIAGNOSIS — D751 Secondary polycythemia: Secondary | ICD-10-CM

## 2018-11-11 NOTE — Progress Notes (Signed)
Sergio [name] presents today for phlebotomy per MD orders. 16 G phlebotomy kit used in L AC. Phlebotomy procedure started at 0750 and ended at 0757 . 524 cc removed. Patient tolerated procedure well. IV needle removed intact. VSS at discharge.

## 2018-11-11 NOTE — Patient Instructions (Signed)

## 2018-11-18 LAB — JAK2 (INCLUDING V617F AND EXON 12), MPL,& CALR-NEXT GEN SEQ

## 2018-11-21 ENCOUNTER — Other Ambulatory Visit: Payer: Self-pay | Admitting: Nurse Practitioner

## 2018-11-21 ENCOUNTER — Telehealth: Payer: Self-pay | Admitting: Hematology and Oncology

## 2018-11-21 ENCOUNTER — Other Ambulatory Visit: Payer: Medicare HMO

## 2018-11-21 DIAGNOSIS — D751 Secondary polycythemia: Secondary | ICD-10-CM

## 2018-11-21 NOTE — Telephone Encounter (Signed)
Called patient per 12/16 sch message to r/s - unable to reschedule - left message for patient to call back to r/s at best time for patient .

## 2018-11-22 ENCOUNTER — Other Ambulatory Visit: Payer: Medicare HMO

## 2018-11-22 ENCOUNTER — Inpatient Hospital Stay: Payer: Medicare HMO | Admitting: Nurse Practitioner

## 2018-11-24 LAB — BCR ABL1 FISH (GENPATH)

## 2019-01-03 ENCOUNTER — Telehealth: Payer: Self-pay | Admitting: Internal Medicine

## 2019-01-03 NOTE — Telephone Encounter (Signed)
Tried to reach regarding voicemail I did leave a message °

## 2019-01-09 ENCOUNTER — Inpatient Hospital Stay (HOSPITAL_BASED_OUTPATIENT_CLINIC_OR_DEPARTMENT_OTHER): Payer: Medicare HMO | Admitting: Nurse Practitioner

## 2019-01-09 ENCOUNTER — Encounter: Payer: Self-pay | Admitting: Nurse Practitioner

## 2019-01-09 ENCOUNTER — Telehealth: Payer: Self-pay | Admitting: Nurse Practitioner

## 2019-01-09 ENCOUNTER — Inpatient Hospital Stay: Payer: Medicare HMO | Attending: Hematology and Oncology

## 2019-01-09 VITALS — BP 131/92 | HR 77 | Temp 98.2°F | Resp 18 | Ht 69.0 in | Wt 182.3 lb

## 2019-01-09 DIAGNOSIS — D751 Secondary polycythemia: Secondary | ICD-10-CM

## 2019-01-09 DIAGNOSIS — M1990 Unspecified osteoarthritis, unspecified site: Secondary | ICD-10-CM | POA: Diagnosis not present

## 2019-01-09 DIAGNOSIS — I10 Essential (primary) hypertension: Secondary | ICD-10-CM

## 2019-01-09 DIAGNOSIS — E291 Testicular hypofunction: Secondary | ICD-10-CM

## 2019-01-09 DIAGNOSIS — Z79899 Other long term (current) drug therapy: Secondary | ICD-10-CM | POA: Insufficient documentation

## 2019-01-09 LAB — CBC WITH DIFFERENTIAL (CANCER CENTER ONLY)
Abs Immature Granulocytes: 0.05 10*3/uL (ref 0.00–0.07)
Basophils Absolute: 0 10*3/uL (ref 0.0–0.1)
Basophils Relative: 1 %
Eosinophils Absolute: 0.1 10*3/uL (ref 0.0–0.5)
Eosinophils Relative: 2 %
HCT: 55.3 % — ABNORMAL HIGH (ref 39.0–52.0)
Hemoglobin: 19.4 g/dL — ABNORMAL HIGH (ref 13.0–17.0)
Immature Granulocytes: 1 %
Lymphocytes Relative: 26 %
Lymphs Abs: 2.1 10*3/uL (ref 0.7–4.0)
MCH: 29.5 pg (ref 26.0–34.0)
MCHC: 35.1 g/dL (ref 30.0–36.0)
MCV: 84 fL (ref 80.0–100.0)
Monocytes Absolute: 0.6 10*3/uL (ref 0.1–1.0)
Monocytes Relative: 8 %
Neutro Abs: 5.3 10*3/uL (ref 1.7–7.7)
Neutrophils Relative %: 62 %
Platelet Count: 168 10*3/uL (ref 150–400)
RBC: 6.58 MIL/uL — ABNORMAL HIGH (ref 4.22–5.81)
RDW: 14 % (ref 11.5–15.5)
WBC Count: 8.2 10*3/uL (ref 4.0–10.5)
nRBC: 0 % (ref 0.0–0.2)

## 2019-01-09 NOTE — Telephone Encounter (Signed)
Gave avs and calendar, will call patient once 6 week office visit is scheduled

## 2019-01-09 NOTE — Progress Notes (Signed)
Hematology/Oncology OutpatientProgress Note  Patient Name:  Sergio [name]  DOB: [birth year]   Referring Provider: Aida Puffer MD [address] [place] [address]   Reason for Visit: In the setting of erythrocytosis, secondary to exogenous testosterone, he presents now for the results of his preliminary laboratory studies and recommendations  Medical History: Sergio [name] is a 68 year old resident of [place] this past medical history is significant for primary hypertension; degenerative joint disease involving the fingers; and hypogonadism/low testosterone.  His primary care physician is Dr. Aida Puffer.  He is alone at this visit.  Since February, he was started on AndroGel daily.  Issam was aware that his hemoglobin was elevated nearly 3 months ago. His dose of AndroGel was decreased by 50% in August to AndroGel: 50 mg once daily.  At that time, on August 01, 2018: A complete blood count showed hemoglobin 19.3 hematocrit 56.5 MCV 85.7 MCH 29.3 RDW 15.2 WBC 7.8 with 60% neutrophils 30% lymphocytes 7% monocytes 2% eosinophils 1% basophil; platelets 172,000.  Vitamin B12 1654.  Serum iron 94 TIBC 349 iron saturation 27%. TSH 1.8. Total testosterone 441. He has no prior peripheral arterial or venous thromboembolic disease. He has no known coronary artery or cerebrovascular disease.  Because of his elevated red blood cell volume, 3 months ago, his antral gel was decreased by 50%.  He now uses AndroGel: 0.5 mg daily.  Although his blood pressure remains elevated, it has improved since August.  He continues on lisinopril/hydrochlorothiazide.  He is not taking aspirin.  Interval History: In the interim since his last visit, he reports no new problems or complaints.  He states he cannot tell any difference when he has had a therapeutic phlebotomy versus when he has not.  I have advised him that he may not be able to tell from his symptoms, but this is necessary to prevent risk of stroke or  blood clots due to his erythrocytosis.  He voices understanding.  We will likely need to do CBCs every 2 weeks until he stable at which point we can back off to every 2 months.  This will be necessary as long as he is on the exogenous testosterone.  I was able to talk to a nurse at Dr. Fredirick Maudlin office and confirm that he had least 3 years of normal hemoglobins prior to going on exogenous testosterone.  He has had a 50% reduction in his dose of AndroGel but this made no difference in his hemoglobin.  He does admit to taking an extra "click" of AndroGel once every 2 to 3 weeks when he feels like he needed due to his activity for the day and I think that is just fine.  He has some hypertension which is managed by Dr. Clarene Duke.    Past Medical History:  Diagnosis Date  . Hypertension   . Low testosterone    Past Surgical History:  Procedure Laterality Date  . RETINAL DETACHMENT SURGERY Right    Allergies:  He has no known medical allergies He has no food or seasonal allergies  Current Outpatient Medications on File Prior to Visit  Medication Sig  . hydrochlorothiazide (HYDRODIURIL) 25 MG tablet TAKE 1 TABLET BY MOUTH DAILY WITH LISINOPRIL  . lisinopril (PRINIVIL,ZESTRIL) 10 MG tablet Take 10 mg by mouth daily.  . Multiple Vitamin (MULTIVITAMIN) tablet Take 4 tablets by mouth daily.  . NON FORMULARY Testosterone gel  . testosterone (ANDROGEL) 50 MG/5GM (1%) GEL Place onto the skin daily. Testosterone gel   No  current facility-administered medications on file prior to visit.     Review of Systems: he has occasional joint pain in hands but feels fine otherwise, ROS negative otherwise.   Physical Examination: Vital Signs: Body surface area is 2.01 meters squared.  Vitals:   01/09/19 1110  BP: (!) 131/92  Pulse: 77  Resp: 18  Temp: 98.2 F (36.8 C)  SpO2: 96%    Filed Weights   01/09/19 1110  Weight: 182 lb 4.8 oz (82.7 kg)  ECOG PERFORMANCE STATUS: 0  Constitutional:  Sergio [name] is fully nourished and developed.  He looks age appropriate.  He is friendly and cooperative without respiratory compromise at rest. Skin: No rashes, scaling, dryness, jaundice, or itching.  His face and chest wall are plethoric. HEENT: Head is normocephalic and atraumatic.  Pupils are equal round and reactive to light and accommodation.  Sclerae are anicteric.  Conjunctivae are pink.  No sinus tenderness nor oropharyngeal lesions.  Lips without cracking or peeling; tongue without mass, inflammation, or nodularity.  Mucous membranes are moist. Neck: Supple and symmetric.  No jugular venous distention or thyromegaly.  Trachea is midline. Lymphatics: No cervical or supraclavicular lymphadenopathy.  No epitrochlear, axillary, or inguinal lymphadenopathy is appreciated. Respiratory/chest: Thorax is symmetrical.  Breath sounds are clear to auscultation and percussion.  Normal excursion and respiratory effort. Back: Symmetric without deformity or tenderness. Cardiovascular: Heart rate and rhythm are regular without murmurs. Gastrointestinal: Abdomen is soft, nontender; no organomegaly.  Bowel sounds are normoactive.  No masses are appreciated. Extremities: In the lower extremities, there is no asymmetric swelling, erythema, tenderness, or cord formation.  No clubbing, cyanosis, nor edema. Hematologic: No petechiae, hematomas, or ecchymoses. Psychological:  He is oriented to person, place, and time; normal affect, memory, and cognition. Neurological: There are no gross neurologic deficits.  Laboratory Results: I have reviewed the data as listed: October 24, 2018  Ref Range & Units 13:10  WBC Count 4.0 - 10.5 K/uL 8.0   RBC 4.22 - 5.81 MIL/uL 6.59High    Hemoglobin 13.0 - 17.0 g/dL 19.3High    HCT 39.0 - 52.0 % 55.5High    MCV 80.0 - 100.0 fL 84.2   MCH 26.0 - 34.0 pg 29.3   MCHC 30.0 - 36.0 g/dL 25.4   RDW 27.0 - 62.3 % 14.2   Platelet Count 150 - 400 K/uL 174   nRBC 0.0 - 0.2 % 0.0     Neutrophils Relative % % 62   Neutro Abs 1.7 - 7.7 K/uL 5.0   Lymphocytes Relative % 29   Lymphs Abs 0.7 - 4.0 K/uL 2.3   Monocytes Relative % 7   Monocytes Absolute 0.1 - 1.0 K/uL 0.5   Eosinophils Relative % 1   Eosinophils Absolute 0.0 - 0.5 K/uL 0.1   Basophils Relative % 0   Basophils Absolute 0.0 - 0.1 K/uL 0.0   Immature Granulocytes % 1   Abs Immature Granulocytes 0.00 - 0.07 K/uL 0.04     Ref Range & Units 13:10  Sodium 135 - 145 mmol/L 134Low    Potassium 3.5 - 5.1 mmol/L 3.5   Chloride 98 - 111 mmol/L 100   CO2 22 - 32 mmol/L 24   Glucose, Bld 70 - 99 mg/dL 762GBTD    BUN 8 - 23 mg/dL 16   Creatinine, Ser 1.76 - 1.24 mg/dL 1.60   Calcium 8.9 - 73.7 mg/dL 9.2   Total Protein 6.5 - 8.1 g/dL 7.1   Albumin 3.5 - 5.0 g/dL  3.9   AST 15 - 41 U/L 26   ALT 0 - 44 U/L 31   Alkaline Phosphatase 38 - 126 U/L 52   Total Bilirubin 0.3 - 1.2 mg/dL 4.0JWJX1.5High    GFR calc non Af Amer >60 mL/min >60   GFR calc Af Amer >60 mL/min >60   Erythropoietin 11.3 Total testosterone 295 Free testosterone 7.2  Summary/Assessment:  Since February, he was started on AndroGel daily.  Chrissie NoaWilliam was aware that his hemoglobin was elevated nearly 3 months ago. His dose of AndroGel was decreased by 50% in August to AndroGel: 50 mg once daily. On August 01, 2018: A complete blood count showed hemoglobin 19.3 hematocrit 56.5 MCV 85.7 MCH 29.3 RDW 15.2 WBC 7.8 with 60% neutrophils 30% lymphocytes 7% monocytes 2% eosinophils 1% basophil; platelets 172,000.  Vitamin B12 1654.  Serum iron 94 TIBC 349 iron saturation 27%. TSH 1.8. Total testosterone 441. He has no prior peripheral arterial or venous thromboembolic disease. He has no known coronary artery or cerebrovascular disease.  Because of his elevated red blood cell volume, 3 months ago, his antral gel was decreased by 50%.  He now uses AndroGel: 50 mg daily. He does admit to adding "a third click" once every 2-3 weeks when he is going to be out with his wife  doing things all day long.    Although his blood pressure remains elevated, it has improved since August.  He continues on lisinopril/hydrochlorothiazide. Since February, he has been on AndroGel daily.  He was instructed to reduce the AndroGel by 50%.  Chrissie NoaWilliam was aware that his hemoglobin was elevated nearly 3 months ago. It was normal prior to testosterone replacement therapy.  Recommendation/Plan:  Secondary erythrocytosis: Will check labs every 2 weeks and arrange therapeutic phlebotomy to keep hematocrit less than or equal to 45.  I did confirm with Dr. Fredirick MaudlinLittle's office that his hemoglobin was within normal range prior to beginning exogenous testosterone replacement therapy.  Specifically his hemoglobin in November 2018 was 16.0 and in November 2016 it was 16. It was not until 6 months after he began the testosterone therapy that his hemoglobin began to rise.  He has responded well to therapeutic phlebotomy.  His hematocrit today was 55.  We will arrange for therapeutic phlebotomy to keep his hematocrit less than or equal to 45.  We will bring him back every 2 weeks.  We will have him see Dr. Georgiann MohsGudina in 6 weeks.  The results of his laboratory studies were reviewed and discussed in detail.  Although his dose of AndroGel was decreased by 50%, his hemoglobin/hematocrit remains relatively unchanged.   Erythropoietin level was normal.  Additionally JAK 2 V617F w/reflex MPL, CALR, Exon 12 were all normal, excluding a myloproliferative disorder.  Case discussed with Dr. Darnelle CatalanMagrinat in Dr. Earmon PhoenixGudena's absence.  He was advised to call us in the interim should any new or untoward problems arise.  This note was dictated using voice activated technology/software.  Unfortunately, typographical errors are not uncommon, and transcription is subject to mistakes and regrettably misinterpretation.  If necessary, clarification of the above information can be discussed with me at any time.   Bobbe MedicoSarah Ranvir Renovato, NP-C,  AOCNP

## 2019-01-11 ENCOUNTER — Inpatient Hospital Stay: Payer: Medicare HMO

## 2019-01-11 VITALS — BP 138/0 | HR 86 | Temp 98.3°F | Resp 14

## 2019-01-11 DIAGNOSIS — D751 Secondary polycythemia: Secondary | ICD-10-CM | POA: Diagnosis not present

## 2019-01-11 NOTE — Patient Instructions (Signed)

## 2019-01-11 NOTE — Progress Notes (Signed)
Per office note, phlebotomy performed.  Procedure done via left AC using 16g phlebotomy kit.  500gm removed over approximately 5 minutes.  Pt tolerated well.  Drink and snack provided.  Pt observed 30 minutes post procedure.

## 2019-01-24 ENCOUNTER — Other Ambulatory Visit: Payer: Self-pay

## 2019-01-25 ENCOUNTER — Inpatient Hospital Stay: Payer: Medicare HMO

## 2019-01-25 ENCOUNTER — Other Ambulatory Visit: Payer: Self-pay

## 2019-01-25 DIAGNOSIS — D751 Secondary polycythemia: Secondary | ICD-10-CM

## 2019-01-25 LAB — CBC WITH DIFFERENTIAL (CANCER CENTER ONLY)
Abs Immature Granulocytes: 0.11 10*3/uL — ABNORMAL HIGH (ref 0.00–0.07)
Basophils Absolute: 0.1 10*3/uL (ref 0.0–0.1)
Basophils Relative: 1 %
Eosinophils Absolute: 0.1 10*3/uL (ref 0.0–0.5)
Eosinophils Relative: 2 %
HCT: 53.2 % — ABNORMAL HIGH (ref 39.0–52.0)
Hemoglobin: 18.1 g/dL — ABNORMAL HIGH (ref 13.0–17.0)
Immature Granulocytes: 1 %
Lymphocytes Relative: 21 %
Lymphs Abs: 1.8 10*3/uL (ref 0.7–4.0)
MCH: 28.9 pg (ref 26.0–34.0)
MCHC: 34 g/dL (ref 30.0–36.0)
MCV: 85 fL (ref 80.0–100.0)
Monocytes Absolute: 0.7 10*3/uL (ref 0.1–1.0)
Monocytes Relative: 8 %
Neutro Abs: 5.9 10*3/uL (ref 1.7–7.7)
Neutrophils Relative %: 67 %
Platelet Count: 160 10*3/uL (ref 150–400)
RBC: 6.26 MIL/uL — ABNORMAL HIGH (ref 4.22–5.81)
RDW: 13.4 % (ref 11.5–15.5)
WBC Count: 8.7 10*3/uL (ref 4.0–10.5)
nRBC: 0 % (ref 0.0–0.2)

## 2019-01-25 NOTE — Progress Notes (Signed)
18G angiocath performed phlebotomy in right posterior wrist without difficulty.  531 gms of blood obtained and wasted.  Pt tolerated procedure without problems.  Nourishments given.

## 2019-02-07 ENCOUNTER — Other Ambulatory Visit: Payer: Self-pay

## 2019-02-07 DIAGNOSIS — D751 Secondary polycythemia: Secondary | ICD-10-CM

## 2019-02-08 ENCOUNTER — Inpatient Hospital Stay: Payer: Medicare HMO | Attending: Hematology and Oncology

## 2019-02-08 ENCOUNTER — Inpatient Hospital Stay: Payer: Medicare HMO

## 2019-02-08 VITALS — BP 127/74 | HR 69 | Temp 98.6°F | Resp 16

## 2019-02-08 DIAGNOSIS — D751 Secondary polycythemia: Secondary | ICD-10-CM | POA: Insufficient documentation

## 2019-02-08 LAB — CBC WITH DIFFERENTIAL (CANCER CENTER ONLY)
Abs Immature Granulocytes: 0.04 10*3/uL (ref 0.00–0.07)
Basophils Absolute: 0 10*3/uL (ref 0.0–0.1)
Basophils Relative: 0 %
Eosinophils Absolute: 0.2 10*3/uL (ref 0.0–0.5)
Eosinophils Relative: 2 %
HCT: 48.8 % (ref 39.0–52.0)
Hemoglobin: 16.7 g/dL (ref 13.0–17.0)
Immature Granulocytes: 1 %
Lymphocytes Relative: 29 %
Lymphs Abs: 2 10*3/uL (ref 0.7–4.0)
MCH: 29.1 pg (ref 26.0–34.0)
MCHC: 34.2 g/dL (ref 30.0–36.0)
MCV: 85 fL (ref 80.0–100.0)
Monocytes Absolute: 0.6 10*3/uL (ref 0.1–1.0)
Monocytes Relative: 9 %
Neutro Abs: 4.1 10*3/uL (ref 1.7–7.7)
Neutrophils Relative %: 59 %
Platelet Count: 191 10*3/uL (ref 150–400)
RBC: 5.74 MIL/uL (ref 4.22–5.81)
RDW: 13.2 % (ref 11.5–15.5)
WBC Count: 7 10*3/uL (ref 4.0–10.5)
nRBC: 0 % (ref 0.0–0.2)

## 2019-02-08 NOTE — Patient Instructions (Signed)

## 2019-02-08 NOTE — Progress Notes (Signed)
Sergio [name] presents today for phlebotomy per MD orders. Phlebotomy procedure started at 0900 and ended at 0908 with 500 grams removed via 16 g to LAC. Patient observed for 30 minutes after procedure without any incident. Patient tolerated procedure well diet and nutrition offered.

## 2019-02-08 NOTE — Progress Notes (Signed)
Pt observed for 30 minutes.  Drink and snack  Discharged to home with VSS

## 2019-02-20 ENCOUNTER — Other Ambulatory Visit: Payer: Self-pay | Admitting: *Deleted

## 2019-02-20 DIAGNOSIS — D751 Secondary polycythemia: Secondary | ICD-10-CM

## 2019-02-21 ENCOUNTER — Other Ambulatory Visit: Payer: Self-pay

## 2019-02-21 ENCOUNTER — Inpatient Hospital Stay: Payer: Medicare HMO | Admitting: Hematology and Oncology

## 2019-02-21 ENCOUNTER — Telehealth: Payer: Self-pay | Admitting: Hematology and Oncology

## 2019-02-21 ENCOUNTER — Inpatient Hospital Stay: Payer: Medicare HMO

## 2019-02-21 DIAGNOSIS — D751 Secondary polycythemia: Secondary | ICD-10-CM

## 2019-02-21 LAB — CBC WITH DIFFERENTIAL (CANCER CENTER ONLY)
Abs Immature Granulocytes: 0.03 10*3/uL (ref 0.00–0.07)
Basophils Absolute: 0.1 10*3/uL (ref 0.0–0.1)
Basophils Relative: 1 %
Eosinophils Absolute: 0.2 10*3/uL (ref 0.0–0.5)
Eosinophils Relative: 3 %
HCT: 48.9 % (ref 39.0–52.0)
Hemoglobin: 16.6 g/dL (ref 13.0–17.0)
Immature Granulocytes: 1 %
Lymphocytes Relative: 33 %
Lymphs Abs: 2.2 10*3/uL (ref 0.7–4.0)
MCH: 28.7 pg (ref 26.0–34.0)
MCHC: 33.9 g/dL (ref 30.0–36.0)
MCV: 84.6 fL (ref 80.0–100.0)
Monocytes Absolute: 0.6 10*3/uL (ref 0.1–1.0)
Monocytes Relative: 10 %
Neutro Abs: 3.5 10*3/uL (ref 1.7–7.7)
Neutrophils Relative %: 52 %
Platelet Count: 174 10*3/uL (ref 150–400)
RBC: 5.78 MIL/uL (ref 4.22–5.81)
RDW: 13.1 % (ref 11.5–15.5)
WBC Count: 6.6 10*3/uL (ref 4.0–10.5)
nRBC: 0 % (ref 0.0–0.2)

## 2019-02-21 NOTE — Assessment & Plan Note (Signed)
Due to testosterone replacement therapy with androgen. Previous work-up 2019: MPN panel: Negative for Jak 2, MPL, CALR Erythropoietin 11.3, hemoglobin 19.9  Current treatment: Phlebotomies every 2 weeks started 11/11/2018  Lab review:  Return to clinic in 3 months with labs and follow-up

## 2019-02-21 NOTE — Progress Notes (Signed)
[facility] CONSULT NOTE  Patient Care Team: Aida Puffer, MD as PCP - General (Family Medicine)  CHIEF COMPLAINTS/PURPOSE OF CONSULTATION: Secondary erythrocytosis  HISTORY OF PRESENTING ILLNESS:  Sergio [name] 68 y.o. 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 In 11/2018 he had an Hg of 19.9 at his PCP and has since been receiving phlebotomies every 2 weeks. He presents to the clinic alone today. He notes he was fatigued following his first phlebotomy but denies any other symptoms since. He works as an Museum/gallery exhibitions officer in [employer]   I reviewed her records extensively and collaborated the history with the patient.  MEDICAL HISTORY:  Past Medical History:  Diagnosis Date  . Hypertension   . Low testosterone     SURGICAL HISTORY: Past Surgical History:  Procedure Laterality Date  . RETINAL DETACHMENT SURGERY Right     SOCIAL HISTORY: Social History   Socioeconomic History  . Marital status: Married    Spouse name: [name]  . Number of children: 2  . Years of education: Not on file  . Highest education level: Not on file  Occupational History  . Not on file  Social Needs  . Financial resource strain: Not on file  . Food insecurity:    Worry: Not on file    Inability: Not on file  . Transportation needs:    Medical: Not on file    Non-medical: Not on file  Tobacco Use  . Smoking status: Never Smoker  . Smokeless tobacco: Never Used  Substance and Sexual Activity  . Alcohol use: Never    Frequency: Never  . Drug use: Never  . Sexual activity: Yes  Lifestyle  . Physical activity:    Days per week: Not on file    Minutes per session: Not on file  . Stress: Not on file  Relationships  . Social connections:    Talks on phone: Not on file    Gets together: Not on file    Attends religious service: Not on file    Active member of club or organization: Not on file    Attends meetings of  clubs or organizations: Not on file    Relationship status: Not on file  . Intimate partner violence:    Fear of current or ex partner: Not on file    Emotionally abused: Not on file    Physically abused: Not on file    Forced sexual activity: Not on file  Other Topics Concern  . Not on file  Social History Narrative  . Not on file    FAMILY HISTORY: Family History  Problem Relation Age of Onset  . Heart failure Mother   . Heart failure Father     ALLERGIES:  has No Known Allergies.  MEDICATIONS:  Current Outpatient Medications  Medication Sig Dispense Refill  . hydrochlorothiazide (HYDRODIURIL) 25 MG tablet TAKE 1 TABLET BY MOUTH DAILY WITH LISINOPRIL  1  . lisinopril (PRINIVIL,ZESTRIL) 10 MG tablet Take 10 mg by mouth daily.  3  . Multiple Vitamin (MULTIVITAMIN) tablet Take 4 tablets by mouth daily.    . NON FORMULARY Testosterone gel    . testosterone (ANDROGEL) 50 MG/5GM (1%) GEL Place onto the skin daily. Testosterone gel     No current facility-administered medications for this visit.     REVIEW OF SYSTEMS:   Constitutional: Denies fevers, chills or abnormal night sweats Eyes: Denies blurriness of vision, double vision or watery eyes Ears,  nose, mouth, throat, and face: Denies mucositis or sore throat Respiratory: Denies cough, dyspnea or wheezes Cardiovascular: Denies palpitation, chest discomfort or lower extremity swelling Gastrointestinal:  Denies nausea, heartburn or change in bowel habits Skin: Denies abnormal skin rashes Lymphatics: Denies new lymphadenopathy or easy bruising Neurological:Denies numbness, tingling or new weaknesses Behavioral/Psych: Mood is stable, no new changes  All other systems were reviewed with the patient and are negative.  PHYSICAL EXAMINATION: ECOG PERFORMANCE STATUS: 0 - Asymptomatic  Vitals:   02/21/19 0807  BP: (!) 141/89  Pulse: 71  Resp: 18  Temp: 98.5 F (36.9 C)  SpO2: 98%   Filed Weights   02/21/19 0807   Weight: 185 lb 12.8 oz (84.3 kg)    GENERAL:alert, no distress and comfortable SKIN: skin color, texture, turgor are normal, no rashes or significant lesions EYES: normal, conjunctiva are pink and non-injected, sclera clear OROPHARYNX:no exudate, no erythema and lips, buccal mucosa, and tongue normal  NECK: supple, thyroid normal size, non-tender, without nodularity LYMPH:  no palpable lymphadenopathy in the cervical, axillary or inguinal LUNGS: clear to auscultation and percussion with normal breathing effort HEART: regular rate & rhythm and no murmurs and no lower extremity edema ABDOMEN:abdomen soft, non-tender and normal bowel sounds Musculoskeletal:no cyanosis of digits and no clubbing  PSYCH: alert & oriented x 3 with fluent speech NEURO: no focal motor/sensory deficits  LABORATORY DATA:  I have reviewed the data as listed Lab Results  Component Value Date   WBC 6.6 02/21/2019   HGB 16.6 02/21/2019   HCT 48.9 02/21/2019   MCV 84.6 02/21/2019   PLT 174 02/21/2019   Lab Results  Component Value Date   NA 134 (L) 10/24/2018   K 3.5 10/24/2018   CL 100 10/24/2018   CO2 24 10/24/2018    RADIOGRAPHIC STUDIES: I have personally reviewed the radiological reports and agreed with the findings in the report.  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 Previous work-up 2019: MPN panel: Negative for Jak 2, MPL, CALR Erythropoietin 11.3, hemoglobin 19.9  Current treatment: Phlebotomies every 2 weeks started 11/11/2018  Lab review: Hemoglobin 16.6 with hematocrit 48.9 Based on excellent improvement in his hemoglobin with phlebotomies being done every 2 weeks, we recommended changing the frequency of phlebotomies to every 4 weeks. Return to clinic in 3 months with labs and follow-up   All questions were answered. The patient knows to call the clinic with any problems, questions or concerns.   Serena Croissant, MD  02/21/2019   I, Kirt Boys Dorshimer, am acting as scribe for Serena Croissant, MD.  I have reviewed the above documentation for accuracy and completeness, and I agree with the above.

## 2019-02-21 NOTE — Telephone Encounter (Signed)
Gave avs and calendar ° °

## 2019-02-22 ENCOUNTER — Inpatient Hospital Stay: Payer: Medicare HMO

## 2019-02-22 ENCOUNTER — Other Ambulatory Visit: Payer: Medicare HMO

## 2019-02-22 ENCOUNTER — Ambulatory Visit: Payer: Medicare HMO | Admitting: Hematology and Oncology

## 2019-02-24 ENCOUNTER — Other Ambulatory Visit: Payer: Self-pay

## 2019-02-24 ENCOUNTER — Inpatient Hospital Stay: Payer: Medicare HMO

## 2019-02-24 VITALS — BP 124/78 | HR 77 | Temp 98.4°F | Resp 16

## 2019-02-24 DIAGNOSIS — D751 Secondary polycythemia: Secondary | ICD-10-CM | POA: Diagnosis not present

## 2019-02-24 NOTE — Progress Notes (Signed)
Sergio [name] presents today for phlebotomy per MD orders. 16 G Phlebotomy kit used in [facility] Phlebotomy procedure started at 0739 and ended at [place] . 527cc removed.  Patient tolerated procedure well. IV needle removed intact.   Pt ate and drank post phlebotomy.  VSS stable at d/c.

## 2019-02-24 NOTE — Patient Instructions (Signed)

## 2019-03-24 ENCOUNTER — Other Ambulatory Visit: Payer: Self-pay

## 2019-03-24 ENCOUNTER — Inpatient Hospital Stay: Payer: Medicare HMO | Attending: Hematology and Oncology

## 2019-03-24 NOTE — Progress Notes (Signed)
Sergio [name] presents today for phlebotomy per MD orders. 16 G Phlebotomy kit used in [facility] Phlebotomy procedure started at 0740 and ended at 35 . 522cc removed.  Patient tolerated procedure well. IV needle removed intact.   Pt ate and drank post phlebotomy.  VSS stable at d/c.

## 2019-03-24 NOTE — Patient Instructions (Signed)

## 2019-04-21 ENCOUNTER — Other Ambulatory Visit: Payer: Self-pay

## 2019-04-21 ENCOUNTER — Inpatient Hospital Stay: Payer: Medicare HMO | Attending: Hematology and Oncology

## 2019-04-21 VITALS — BP 135/95 | HR 73 | Temp 98.6°F | Resp 16

## 2019-04-21 DIAGNOSIS — D751 Secondary polycythemia: Secondary | ICD-10-CM

## 2019-04-21 NOTE — Progress Notes (Signed)
Hudsonpresents today for phlebotomy per MD orders. 16 G Phlebotomy kit used in [facility] Phlebotomy procedure started [id number] ended at 0746 . 532cc removed.  Patient tolerated procedure well. IV needle removed intact. Pt ate and drank post phlebotomy.  VSS stable at d/c.

## 2019-04-21 NOTE — Patient Instructions (Signed)

## 2019-05-15 NOTE — Assessment & Plan Note (Signed)
Due to testosterone replacement therapy with androgen. Previous work-up 2019: MPN panel: Negative for Jak 2, MPL, CALR Erythropoietin 11.3, hemoglobin 19.9  Current treatment: Phlebotomies every 2 weeks started 11/11/2018 switched to monthly phlebotomies 02/21/2019  Lab review:  Plan:

## 2019-05-20 NOTE — Progress Notes (Signed)
Patient Care Team: Aida PufferLittle, James, MD as PCP - General (Family Medicine)  DIAGNOSIS:    ICD-10-CM   1. Secondary erythrocytosis  D75.1     CHIEF COMPLIANT: Secondary erythrocytosis  INTERVAL HISTORY: Sergio [name] is a 68 y.o. 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 Sergio [name] presents to the clinic today for phlebotomy.  REVIEW OF SYSTEMS:   Constitutional: Denies fevers, chills or abnormal weight loss Eyes: Denies blurriness of vision Ears, nose, mouth, throat, and face: Denies mucositis or sore throat Respiratory: Denies cough, dyspnea or wheezes Cardiovascular: Denies palpitation, chest discomfort Gastrointestinal: Denies nausea, heartburn or change in bowel habits Skin: Denies abnormal skin rashes Lymphatics: Denies new lymphadenopathy or easy bruising Neurological: Denies numbness, tingling or new weaknesses Behavioral/Psych: Mood is stable, no new changes  Extremities: No lower extremity edema All other systems were reviewed with the patient and are negative.  I have reviewed the past medical history, past surgical history, social history and family history with the patient and they are unchanged from previous note.  ALLERGIES:  has No Known Allergies.  MEDICATIONS:  Current Outpatient Medications  Medication Sig Dispense Refill  . hydrochlorothiazide (HYDRODIURIL) 25 MG tablet TAKE 1 TABLET BY MOUTH DAILY WITH LISINOPRIL  1  . lisinopril (PRINIVIL,ZESTRIL) 10 MG tablet Take 10 mg by mouth daily.  3  . Multiple Vitamin (MULTIVITAMIN) tablet Take 4 tablets by mouth daily.    . NON FORMULARY Testosterone gel    . testosterone (ANDROGEL) 50 MG/5GM (1%) GEL Place onto the skin daily. Testosterone gel     No current facility-administered medications for this visit.     PHYSICAL EXAMINATION: ECOG PERFORMANCE STATUS: 1 - Symptomatic but completely ambulatory  There were  no vitals filed for this visit. There were no vitals filed for this visit.  GENERAL: alert, no distress and comfortable SKIN: skin color, texture, turgor are normal, no rashes or significant lesions EYES: normal, Conjunctiva are pink and non-injected, sclera clear OROPHARYNX: no exudate, no erythema and lips, buccal mucosa, and tongue normal  NECK: supple, thyroid normal size, non-tender, without nodularity LYMPH: no palpable lymphadenopathy in the cervical, axillary or inguinal LUNGS: clear to auscultation and percussion with normal breathing effort HEART: regular rate & rhythm and no murmurs and no lower extremity edema ABDOMEN: abdomen soft, non-tender and normal bowel sounds MUSCULOSKELETAL: no cyanosis of digits and no clubbing  NEURO: alert & oriented x 3 with fluent speech, no focal motor/sensory deficits EXTREMITIES: No lower extremity edema  LABORATORY DATA:  I have reviewed the data as listed CMP Latest Ref Rng & Units 11/07/2018 10/24/2018  Glucose 70 - 99 mg/dL - 161(W102(H)  BUN 8 - 23 mg/dL - 16  Creatinine 9.600.61 - 1.24 mg/dL - 4.541.05  Sodium 098135 - 119145 mmol/L - 134(L)  Potassium 3.5 - 5.1 mmol/L - 3.5  Chloride 98 - 111 mmol/L - 100  CO2 22 - 32 mmol/L - 24  Calcium 8.9 - 10.3 mg/dL - 9.2  Total Protein 6.5 - 8.1 g/dL - 7.1  Total Bilirubin 0.3 - 1.2 mg/dL 1.4(N1.4(H) 8.2(N1.5(H)  Alkaline Phos 38 - 126 U/L - 52  AST 15 - 41 U/L - 26  ALT 0 - 44 U/L - 31    Lab Results  Component Value Date   WBC 6.5 05/22/2019   HGB 15.0 05/22/2019   HCT 47.1 05/22/2019   MCV 75.6 (L) 05/22/2019   PLT 187 05/22/2019  NEUTROABS 3.7 05/22/2019    ASSESSMENT & PLAN:  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 Previous work-up 2019: MPN panel: Negative for Jak 2, MPL, CALR Erythropoietin 11.3, hemoglobin 19.9  Current treatment: Phlebotomies every 2 weeks started 11/11/2018 switched to monthly phlebotomies 02/21/2019  Lab review: Hemoglobin 15,  hematocrit 47 I discussed with him that we can now maintain him at the current hemoglobin levels and Sergio [name] would not have any increased risk for stroke or heart attack.  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 Plan: Switch to every other month phlebotomy Return to clinic in 6 months with labs and follow-up  No orders of the defined types were placed in this encounter.  The patient has a good understanding of the overall plan. Sergio [name] agrees with it. Sergio [name] will call with any problems that may develop before the next visit here.  Sergio Lose, MD 05/22/2019  Julious Oka Dorshimer am acting as scribe for Dr. Nicholas [name].  I have reviewed the above documentation for accuracy and completeness, and I agree with the above.

## 2019-05-22 ENCOUNTER — Telehealth: Payer: Self-pay | Admitting: Hematology and Oncology

## 2019-05-22 ENCOUNTER — Inpatient Hospital Stay: Payer: Medicare HMO | Admitting: Hematology and Oncology

## 2019-05-22 ENCOUNTER — Other Ambulatory Visit: Payer: Self-pay

## 2019-05-22 ENCOUNTER — Inpatient Hospital Stay: Payer: Medicare HMO | Attending: Hematology and Oncology

## 2019-05-22 ENCOUNTER — Inpatient Hospital Stay: Payer: Medicare HMO

## 2019-05-22 VITALS — BP 160/99 | HR 71 | Resp 18

## 2019-05-22 DIAGNOSIS — D751 Secondary polycythemia: Secondary | ICD-10-CM

## 2019-05-22 LAB — CBC WITH DIFFERENTIAL (CANCER CENTER ONLY)
Abs Immature Granulocytes: 0.02 10*3/uL (ref 0.00–0.07)
Basophils Absolute: 0 10*3/uL (ref 0.0–0.1)
Basophils Relative: 1 %
Eosinophils Absolute: 0.1 10*3/uL (ref 0.0–0.5)
Eosinophils Relative: 2 %
HCT: 47.1 % (ref 39.0–52.0)
Hemoglobin: 15 g/dL (ref 13.0–17.0)
Immature Granulocytes: 0 %
Lymphocytes Relative: 31 %
Lymphs Abs: 2.1 10*3/uL (ref 0.7–4.0)
MCH: 24.1 pg — ABNORMAL LOW (ref 26.0–34.0)
MCHC: 31.8 g/dL (ref 30.0–36.0)
MCV: 75.6 fL — ABNORMAL LOW (ref 80.0–100.0)
Monocytes Absolute: 0.6 10*3/uL (ref 0.1–1.0)
Monocytes Relative: 10 %
Neutro Abs: 3.7 10*3/uL (ref 1.7–7.7)
Neutrophils Relative %: 56 %
Platelet Count: 187 10*3/uL (ref 150–400)
RBC: 6.23 MIL/uL — ABNORMAL HIGH (ref 4.22–5.81)
RDW: 15.5 % (ref 11.5–15.5)
WBC Count: 6.5 10*3/uL (ref 4.0–10.5)
nRBC: 0 % (ref 0.0–0.2)

## 2019-05-22 NOTE — Progress Notes (Signed)
Phlebotomy completed on Left arm with stardard phlebotomy kit. 529cc collected. VS collect 30 min. Post.

## 2019-05-22 NOTE — Patient Instructions (Signed)

## 2019-05-22 NOTE — Telephone Encounter (Signed)
Scheduled appt per los. Gave avs and calendar °

## 2019-07-21 ENCOUNTER — Inpatient Hospital Stay: Payer: Medicare HMO | Attending: Hematology and Oncology

## 2019-07-21 ENCOUNTER — Other Ambulatory Visit: Payer: Self-pay

## 2019-07-21 ENCOUNTER — Telehealth: Payer: Self-pay | Admitting: Hematology and Oncology

## 2019-07-21 VITALS — BP 131/90 | HR 74 | Temp 98.0°F | Resp 17

## 2019-07-21 DIAGNOSIS — D751 Secondary polycythemia: Secondary | ICD-10-CM | POA: Insufficient documentation

## 2019-07-21 NOTE — Patient Instructions (Signed)

## 2019-07-21 NOTE — Progress Notes (Signed)
Pt in for phlebotomy today. Procedure started at 0810 with 16G needle in R AC. 175 ccs removed before needle clotted. Procedure reattempted in L AC, but was unsuccessful. Pt stated he would like to come back in 2 weeks rather than having a 3rd attempt. Dr. Lindi Adie notified. Pt reminded to drink plenty of fluids prior to appointment. Dr. Geralyn Flash RN Katheren Puller, RN) to put in scheduling request.

## 2019-07-21 NOTE — Telephone Encounter (Signed)
Added appointment for 8/28. Left message. Schedule mailed. Other appointment remain the same.

## 2019-08-04 ENCOUNTER — Other Ambulatory Visit: Payer: Self-pay

## 2019-08-04 ENCOUNTER — Inpatient Hospital Stay: Payer: Medicare HMO

## 2019-08-04 VITALS — BP 155/92 | HR 73 | Temp 97.8°F | Resp 18

## 2019-08-04 DIAGNOSIS — D751 Secondary polycythemia: Secondary | ICD-10-CM

## 2019-08-04 NOTE — Progress Notes (Signed)
Patient came for therapeutic phlebotomy today. 536cc blood removed from right AC using 16G needle phlebotomy kit.

## 2019-08-04 NOTE — Patient Instructions (Signed)

## 2019-09-22 ENCOUNTER — Other Ambulatory Visit: Payer: Self-pay

## 2019-09-22 ENCOUNTER — Inpatient Hospital Stay: Payer: Medicare HMO | Attending: Hematology and Oncology

## 2019-09-22 DIAGNOSIS — D751 Secondary polycythemia: Secondary | ICD-10-CM | POA: Diagnosis not present

## 2019-09-22 NOTE — Progress Notes (Signed)
Sergio [name] presents today for phlebotomy per MD orders. 16 G R AC  Phlebotomy procedure started at 0738 and ended at 0746. 519cc removed. Patient tolerated procedure well. IV needle removed intact. Pt ate and drank. VSS at d/c

## 2019-09-22 NOTE — Patient Instructions (Signed)

## 2019-11-17 ENCOUNTER — Other Ambulatory Visit: Payer: Self-pay | Admitting: *Deleted

## 2019-11-17 DIAGNOSIS — D751 Secondary polycythemia: Secondary | ICD-10-CM

## 2019-11-19 NOTE — Progress Notes (Signed)
Patient Care Team: Tamsen Roers, MD as PCP - General (Family Medicine)  DIAGNOSIS:    ICD-10-CM   1. 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 He presents to the clinic today for phlebotomy.  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  He is tolerating phlebotomies extremely well.  REVIEW OF SYSTEMS:   Constitutional: Denies fevers, chills or abnormal weight loss Eyes: Denies blurriness of vision Ears, nose, mouth, throat, and face: Denies mucositis or sore throat Respiratory: Denies cough, dyspnea or wheezes Cardiovascular: Denies palpitation, chest discomfort Gastrointestinal: Denies nausea, heartburn or change in bowel habits Skin: Denies abnormal skin rashes Lymphatics: Denies new lymphadenopathy or easy bruising Neurological: Denies numbness, tingling or new weaknesses Behavioral/Psych: Mood is stable, no new changes  Extremities: No lower extremity edema All other systems were reviewed with the patient and are negative.  I have reviewed the past medical history, past surgical history, social history and family history with the patient and they are unchanged from previous note.  ALLERGIES:  has No Known Allergies.  MEDICATIONS:  Current Outpatient Medications  Medication Sig Dispense Refill  . hydrochlorothiazide (HYDRODIURIL) 25 MG tablet TAKE 1 TABLET BY MOUTH DAILY WITH LISINOPRIL  1  . lisinopril (PRINIVIL,ZESTRIL) 10 MG tablet Take 10 mg by mouth daily.  3  . Multiple Vitamin (MULTIVITAMIN) tablet Take 4 tablets by mouth daily.    . NON FORMULARY Testosterone gel    . testosterone (ANDROGEL) 50 MG/5GM (1%) GEL Place  onto the skin daily. Testosterone gel     No current facility-administered medications for this visit.    PHYSICAL EXAMINATION: ECOG PERFORMANCE STATUS: 1 - Symptomatic but completely ambulatory  Vitals:   11/20/19 0917  BP: (!) 140/100  Pulse: 66  Resp: 17  Temp: 98.5 F (36.9 C)  SpO2: 99%   Filed Weights   11/20/19 0917  Weight: 183 lb 12.8 oz (83.4 kg)    GENERAL: alert, no distress and comfortable SKIN: skin color, texture, turgor are normal, no rashes or significant lesions EYES: normal, Conjunctiva are pink and non-injected, sclera clear OROPHARYNX: no exudate, no erythema and lips, buccal mucosa, and tongue normal  NECK: supple, thyroid normal size, non-tender, without nodularity LYMPH: no palpable lymphadenopathy in the cervical, axillary or inguinal LUNGS: clear to auscultation and percussion with normal breathing effort HEART: regular rate & rhythm and no murmurs and no lower extremity edema ABDOMEN: abdomen soft, non-tender and normal bowel sounds MUSCULOSKELETAL: no cyanosis of digits and no clubbing  NEURO: alert & oriented x 3 with fluent speech, no focal motor/sensory deficits EXTREMITIES: No lower extremity edema  LABORATORY DATA:  I have reviewed the data as listed CMP Latest Ref Rng & Units 11/07/2018 10/24/2018  Glucose 70 - 99 mg/dL - 102(H)  BUN 8 - 23 mg/dL - 16  Creatinine 0.61 - 1.24 mg/dL - 1.05  Sodium 135 - 145 mmol/L - 134(L)  Potassium 3.5 - 5.1 mmol/L - 3.5  Chloride 98 - 111 mmol/L - 100  CO2 22 - 32 mmol/L - 24  Calcium 8.9 - 10.3 mg/dL - 9.2  Total Protein 6.5 - 8.1 g/dL - 7.1  Total Bilirubin 0.3 - 1.2 mg/dL 1.4(H) 1.5(H)  Alkaline Phos  38 - 126 U/L - 52  AST 15 - 41 U/L - 26  ALT 0 - 44 U/L - 31    Lab Results  Component Value Date   WBC 6.4 11/20/2019   HGB 14.9 11/20/2019   HCT 47.9 11/20/2019   MCV 68.1 (L) 11/20/2019   PLT 178 11/20/2019   NEUTROABS 3.6 11/20/2019    ASSESSMENT & PLAN:  Secondary  erythrocytosis Due to testosterone replacement therapy with androgen. Previous work-up 2019: MPN panel: Negative for Jak 2, MPL, CALR Erythropoietin 11.3, hemoglobin 19.9  Current treatment: Phlebotomies every 2 weeks started 11/11/2018 switched to monthly phlebotomies 02/21/2019 switch to every 2 months 05/22/2019, switched to every 59-month starting 11/20/2019  Lab review: Hemoglobin 14.9, hematocrit 47.9 I discussed with him that we can now maintain him at the current hemoglobin levels and he would not have any increased risk for stroke or heart attack.  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 Plan: Every 3 months phlebotomies Return to clinic in 6 months with labs and follow-up    No orders of the defined types were placed in this encounter.  The patient has a good understanding of the overall plan. he agrees with it. he will call with any problems that may develop before the next visit here.  Serena Croissant, MD 11/20/2019  Jenness Corner Dorshimer, am acting as scribe for Dr. Serena Croissant.  I have reviewed the above document for accuracy and completeness, and I agree with the above.

## 2019-11-20 ENCOUNTER — Other Ambulatory Visit: Payer: Self-pay

## 2019-11-20 ENCOUNTER — Telehealth: Payer: Self-pay | Admitting: Hematology and Oncology

## 2019-11-20 ENCOUNTER — Inpatient Hospital Stay: Payer: Medicare HMO

## 2019-11-20 ENCOUNTER — Inpatient Hospital Stay: Payer: Medicare HMO | Attending: Hematology and Oncology | Admitting: Hematology and Oncology

## 2019-11-20 VITALS — BP 143/88 | HR 61 | Temp 98.3°F | Resp 16

## 2019-11-20 DIAGNOSIS — D751 Secondary polycythemia: Secondary | ICD-10-CM | POA: Insufficient documentation

## 2019-11-20 LAB — CBC WITH DIFFERENTIAL (CANCER CENTER ONLY)
Abs Immature Granulocytes: 0.02 10*3/uL (ref 0.00–0.07)
Basophils Absolute: 0 10*3/uL (ref 0.0–0.1)
Basophils Relative: 1 %
Eosinophils Absolute: 0.2 10*3/uL (ref 0.0–0.5)
Eosinophils Relative: 2 %
HCT: 47.9 % (ref 39.0–52.0)
Hemoglobin: 14.9 g/dL (ref 13.0–17.0)
Immature Granulocytes: 0 %
Lymphocytes Relative: 30 %
Lymphs Abs: 1.9 10*3/uL (ref 0.7–4.0)
MCH: 21.2 pg — ABNORMAL LOW (ref 26.0–34.0)
MCHC: 31.1 g/dL (ref 30.0–36.0)
MCV: 68.1 fL — ABNORMAL LOW (ref 80.0–100.0)
Monocytes Absolute: 0.6 10*3/uL (ref 0.1–1.0)
Monocytes Relative: 9 %
Neutro Abs: 3.6 10*3/uL (ref 1.7–7.7)
Neutrophils Relative %: 58 %
Platelet Count: 178 10*3/uL (ref 150–400)
RBC: 7.03 MIL/uL — ABNORMAL HIGH (ref 4.22–5.81)
RDW: 21 % — ABNORMAL HIGH (ref 11.5–15.5)
WBC Count: 6.4 10*3/uL (ref 4.0–10.5)
nRBC: 0 % (ref 0.0–0.2)

## 2019-11-20 NOTE — Patient Instructions (Signed)

## 2019-11-20 NOTE — Assessment & Plan Note (Signed)
Due to testosterone replacement therapy with androgen. Previous work-up 2019: MPN panel: Negative for Jak 2, MPL, CALR Erythropoietin 11.3, hemoglobin 19.9  Current treatment: Phlebotomies every 2 weeks started 11/11/2018 switched to monthly phlebotomies 02/21/2019 switch to every 2 months 05/22/2019  Lab review: Hemoglobin 15, hematocrit 47 I discussed with him that we can now maintain him at the current hemoglobin levels and he would not have any increased risk for stroke or heart attack.  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 Plan:  Return to clinic in 6 months with labs and follow-up

## 2019-11-20 NOTE — Telephone Encounter (Signed)
I talk with patient regarding schedule  

## 2019-12-08 HISTORY — PX: COLONOSCOPY: SHX174

## 2020-02-19 ENCOUNTER — Inpatient Hospital Stay: Payer: Medicare HMO | Attending: Hematology and Oncology

## 2020-02-19 ENCOUNTER — Other Ambulatory Visit: Payer: Self-pay

## 2020-02-19 VITALS — BP 139/96 | HR 73 | Temp 98.1°F | Resp 18

## 2020-02-19 DIAGNOSIS — D751 Secondary polycythemia: Secondary | ICD-10-CM | POA: Diagnosis not present

## 2020-02-19 NOTE — Patient Instructions (Signed)

## 2020-05-08 ENCOUNTER — Ambulatory Visit (HOSPITAL_COMMUNITY)
Admission: EM | Admit: 2020-05-08 | Discharge: 2020-05-08 | Disposition: A | Discharge status: Home / Self Care | Payer: Medicare HMO | Attending: Emergency Medicine | Admitting: Emergency Medicine

## 2020-05-08 ENCOUNTER — Ambulatory Visit (INDEPENDENT_AMBULATORY_CARE_PROVIDER_SITE_OTHER): Payer: Medicare HMO

## 2020-05-08 ENCOUNTER — Encounter (HOSPITAL_COMMUNITY): Payer: Self-pay | Admitting: Emergency Medicine

## 2020-05-08 ENCOUNTER — Other Ambulatory Visit: Payer: Self-pay

## 2020-05-08 DIAGNOSIS — D751 Secondary polycythemia: Secondary | ICD-10-CM | POA: Insufficient documentation

## 2020-05-08 DIAGNOSIS — Z79899 Other long term (current) drug therapy: Secondary | ICD-10-CM | POA: Diagnosis not present

## 2020-05-08 DIAGNOSIS — J40 Bronchitis, not specified as acute or chronic: Secondary | ICD-10-CM | POA: Insufficient documentation

## 2020-05-08 DIAGNOSIS — R05 Cough: Secondary | ICD-10-CM | POA: Diagnosis not present

## 2020-05-08 DIAGNOSIS — Z20822 Contact with and (suspected) exposure to covid-19: Secondary | ICD-10-CM | POA: Diagnosis not present

## 2020-05-08 DIAGNOSIS — Z7982 Long term (current) use of aspirin: Secondary | ICD-10-CM | POA: Insufficient documentation

## 2020-05-08 DIAGNOSIS — Z8249 Family history of ischemic heart disease and other diseases of the circulatory system: Secondary | ICD-10-CM | POA: Diagnosis not present

## 2020-05-08 DIAGNOSIS — I10 Essential (primary) hypertension: Secondary | ICD-10-CM | POA: Diagnosis not present

## 2020-05-08 DIAGNOSIS — R059 Cough, unspecified: Secondary | ICD-10-CM

## 2020-05-08 MED ORDER — DOXYCYCLINE HYCLATE 100 MG PO CAPS: 100.0000 mg | ORAL_CAPSULE | Freq: Two times a day (BID) | ORAL | 0 refills | Status: AC

## 2020-05-08 MED ORDER — PREDNISONE 50 MG PO TABS: 50.0000 mg | ORAL_TABLET | Freq: Every day | ORAL | 0 refills | Status: AC

## 2020-05-08 MED ORDER — BENZONATATE 200 MG PO CAPS: 200.0000 mg | ORAL_CAPSULE | Freq: Three times a day (TID) | ORAL | 0 refills | Status: AC | PRN

## 2020-05-08 MED ORDER — HYDROCODONE-HOMATROPINE 5-1.5 MG/5ML PO SYRP: 5.0000 mL | ORAL_SOLUTION | Freq: Every evening | ORAL | 0 refills | Status: DC | PRN

## 2020-05-08 NOTE — ED Notes (Signed)
Send out swab obtained, labeled and placed in lab

## 2020-05-08 NOTE — Discharge Instructions (Signed)
Xray normal I am treating for bronchitis Begin doxycycline twice daily for 1 week Prednisone daily for 5 days Tessalon for day time cough Hycodan for nighttime cough  Rest and fluids  Follow up if not improving or worsening

## 2020-05-08 NOTE — ED Provider Notes (Signed)
[facility]    CSN: [id number] Arrival date & time: 05/08/20  0801      History   Chief Complaint Chief Complaint  Patient presents with  . Cough    HPI Sergio [name] is a 69 y.o. male history of hypertension presenting today for evaluation of a cough.  Patient has had a cough for the past 1.5 weeks.  Is started to feel shortness of breath as well as some fatigue.  Cough is productive with yellow sputum.  Has received 2 days Covid vaccine.  Denies history of asthma and tobacco use.  Has used over-the-counter Coricidin without relief.  HPI  Past Medical History:  Diagnosis Date  . Hypertension   . Low testosterone     Patient Active Problem List   Diagnosis Date Noted  . 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 RETINAL DETACHMENT SURGERY Right        Home Medications    Prior to Admission medications   Medication Sig Start Date End Date Taking? Authorizing Provider  aspirin EC 81 MG tablet Take 81 mg by mouth daily.   Yes [provider]  hydrochlorothiazide (HYDRODIURIL) 25 MG tablet TAKE 1 TABLET BY MOUTH DAILY WITH LISINOPRIL 08/01/18  Yes [provider]  lisinopril (PRINIVIL,ZESTRIL) 10 MG tablet Take 10 mg by mouth daily. 09/26/18  Yes [provider]  Multiple Vitamin (MULTIVITAMIN) tablet Take 4 tablets by mouth daily.   Yes [provider]  benzonatate (TESSALON) 200 MG capsule Take 1 capsule (200 mg total) by mouth 3 (three) times daily as needed for up to 7 days for cough. 05/08/20 05/15/20  Brahm Barbeau C, PA-C  doxycycline (VIBRAMYCIN) 100 MG capsule Take 1 capsule (100 mg total) by mouth 2 (two) times daily for 7 days. 05/08/20 05/15/20  Zacaria Pousson C, PA-C  HYDROcodone-homatropine (HYCODAN) 5-1.5 MG/5ML syrup Take 5 mLs by mouth at bedtime as needed for cough. 05/08/20   Ramello Cordial C, PA-C  NON FORMULARY Testosterone gel    [provider]    predniSONE (DELTASONE) 50 MG tablet Take 1 tablet (50 mg total) by mouth daily for 5 days. 05/08/20 05/13/20  Caisen Mangas C, PA-C  testosterone (ANDROGEL) 50 MG/5GM (1%) GEL Place onto the skin daily. Testosterone gel    [provider]    Family History Family History  Problem Relation Age of Onset  . Heart failure Mother   . Heart failure Father     Social History Social History   Tobacco Use  . Smoking status: Never Smoker  . Smokeless tobacco: Never Used  Substance Use Topics  . Alcohol use: Never  . Drug use: Never     Allergies   Patient has no known allergies.   Review of Systems Review of Systems  Constitutional: Positive for fatigue. Negative for activity change, appetite change, chills and fever.  HENT: Positive for congestion, rhinorrhea and sinus pressure. Negative for ear pain, sore throat and trouble swallowing.   Eyes: Negative for discharge and redness.  Respiratory: Positive for cough, chest tightness and shortness of breath.   Cardiovascular: Negative for chest pain.  Gastrointestinal: Negative for abdominal pain, diarrhea, nausea and vomiting.  Musculoskeletal: Negative for myalgias.  Skin: Negative for rash.  Neurological: Negative for dizziness, light-headedness and headaches.     Physical Exam Triage Vital Signs ED Triage Vitals  Enc Vitals Group     BP --      Pulse --  Resp 05/08/20 0827 20     Temp --      Temp Source 05/08/20 0827 Oral     SpO2 --      Weight --      Height --      Head Circumference --      Peak Flow --      Pain Score 05/08/20 0824 0     Pain Loc --      Pain Edu? --      Excl. in GC? --    No data found.  Updated Vital Signs BP 139/89 (BP Location: Left Arm)   Pulse 97   Temp 99.1 F (37.3 C) (Oral)   Resp 20 Comment: coughing frequently  SpO2 96%   Visual Acuity Right Eye Distance:   Left Eye Distance:   Bilateral Distance:    Right Eye Near:   Left Eye Near:    Bilateral Near:      Physical Exam Vitals and nursing note reviewed.  Constitutional:      Appearance: He is well-developed.     Comments: No acute distress  HENT:     Head: Normocephalic and atraumatic.     Ears:     Comments: Bilateral ears without tenderness to palpation of external auricle, tragus and mastoid, EAC's without erythema or swelling, TM's with good bony landmarks and cone of light. Non erythematous.    Nose: Nose normal.     Comments: Nasal mucosa erythematous, nonswollen turbinates    Mouth/Throat:     Comments: Oral mucosa pink and moist, no tonsillar enlargement or exudate. Posterior pharynx patent and nonerythematous, no uvula deviation or swelling. Normal phonation.  Eyes:     Conjunctiva/sclera: Conjunctivae normal.  Cardiovascular:     Rate and Rhythm: Normal rate.  Pulmonary:     Effort: Pulmonary effort is normal. No respiratory distress.     Comments: Frequent coughing in room, coarseness noted with coughing, no wheezing or rales noted Abdominal:     General: There is no distension.  Musculoskeletal:        General: Normal range of motion.     Cervical back: Neck supple.  Skin:    General: Skin is warm and dry.  Neurological:     Mental Status: He is alert and oriented to person, place, and time.      UC Treatments / Results  Labs (all labs ordered are listed, but only abnormal results are displayed) Labs Reviewed  SARS CORONAVIRUS 2 (TAT 6-24 HRS)    EKG   Radiology DG Chest 2 View  Result Date: 05/08/2020 CLINICAL DATA:  Cough for 125 weeks with shortness of breath EXAM: CHEST - 2 VIEW COMPARISON:  None FINDINGS: Cardiomediastinal contours and hilar structures are normal. Lungs are clear. No pleural effusion. Limited assessment of skeletal structures is unremarkable. IMPRESSION: No acute cardiopulmonary disease. Electronically Signed   By: Donzetta Kohut M.D.   On: 05/08/2020 09:28    Procedures Procedures (including critical care time)  Medications  Ordered in UC Medications - No data to display  Initial Impression / Assessment and Plan / UC Course  I have reviewed the triage vital signs and the nursing notes.  Pertinent labs & imaging results that were available during my care of the patient were reviewed by me and considered in my medical decision making (see chart for details).     Chest x-ray negative, independently reviewed myself redness or pneumonia.  Exam suggestive of bronchitis.  Given length of symptoms  treating with doxycycline, prednisone course and cough medicines.  Continue rest and fluids.  Covid PCR pending to rule out.  Discussed strict return precautions. Patient verbalized understanding and is agreeable with plan.  Final Clinical Impressions(s) / UC Diagnoses   Final diagnoses:  Bronchitis  Cough     Discharge Instructions     Xray normal I am treating for bronchitis Begin doxycycline twice daily for 1 week Prednisone daily for 5 days Tessalon for day time cough Hycodan for nighttime cough  Rest and fluids  Follow up if not improving or worsening    ED Prescriptions    Medication Sig Dispense Auth. Provider   doxycycline (VIBRAMYCIN) 100 MG capsule Take 1 capsule (100 mg total) by mouth 2 (two) times daily for 7 days. 14 capsule Pearlina Friedly C, PA-C   benzonatate (TESSALON) 200 MG capsule Take 1 capsule (200 mg total) by mouth 3 (three) times daily as needed for up to 7 days for cough. 28 capsule Tanara Turvey C, PA-C   predniSONE (DELTASONE) 50 MG tablet Take 1 tablet (50 mg total) by mouth daily for 5 days. 5 tablet Yzabelle Calles C, PA-C   HYDROcodone-homatropine (HYCODAN) 5-1.5 MG/5ML syrup Take 5 mLs by mouth at bedtime as needed for cough. 75 mL Deandrea Rion, [place] C, PA-C     I have reviewed the PDMP during this encounter.   Janith Lima, PA-C 05/08/20 1014

## 2020-05-08 NOTE — ED Triage Notes (Signed)
patient has a cough and yellow phlegm for 1 1/2 weeks.  Patient received covid vaccine, 2nd vaccine was January 08, 2020.   No fever.  Patient feels sob and fatigue.

## 2020-05-09 LAB — SARS CORONAVIRUS 2 (TAT 6-24 HRS): SARS Coronavirus 2: NEGATIVE

## 2020-05-20 ENCOUNTER — Other Ambulatory Visit: Payer: Self-pay | Admitting: *Deleted

## 2020-05-20 DIAGNOSIS — D751 Secondary polycythemia: Secondary | ICD-10-CM

## 2020-05-21 ENCOUNTER — Other Ambulatory Visit: Payer: Self-pay

## 2020-05-21 ENCOUNTER — Inpatient Hospital Stay: Payer: Medicare HMO | Attending: Hematology and Oncology

## 2020-05-21 ENCOUNTER — Encounter: Payer: Self-pay | Admitting: Adult Health

## 2020-05-21 ENCOUNTER — Inpatient Hospital Stay: Payer: Medicare HMO | Admitting: Adult Health

## 2020-05-21 ENCOUNTER — Inpatient Hospital Stay: Payer: Medicare HMO

## 2020-05-21 VITALS — BP 148/98 | HR 81 | Temp 99.1°F | Resp 17 | Ht 69.0 in | Wt 185.8 lb

## 2020-05-21 DIAGNOSIS — D751 Secondary polycythemia: Secondary | ICD-10-CM

## 2020-05-21 LAB — CBC WITH DIFFERENTIAL (CANCER CENTER ONLY)
Abs Immature Granulocytes: 0.04 10*3/uL (ref 0.00–0.07)
Basophils Absolute: 0 10*3/uL (ref 0.0–0.1)
Basophils Relative: 1 %
Eosinophils Absolute: 0.2 10*3/uL (ref 0.0–0.5)
Eosinophils Relative: 2 %
HCT: 50.1 % (ref 39.0–52.0)
Hemoglobin: 16 g/dL (ref 13.0–17.0)
Immature Granulocytes: 1 %
Lymphocytes Relative: 25 %
Lymphs Abs: 2.1 10*3/uL (ref 0.7–4.0)
MCH: 22.8 pg — ABNORMAL LOW (ref 26.0–34.0)
MCHC: 31.9 g/dL (ref 30.0–36.0)
MCV: 71.4 fL — ABNORMAL LOW (ref 80.0–100.0)
Monocytes Absolute: 0.8 10*3/uL (ref 0.1–1.0)
Monocytes Relative: 10 %
Neutro Abs: 5.2 10*3/uL (ref 1.7–7.7)
Neutrophils Relative %: 61 %
Platelet Count: 179 10*3/uL (ref 150–400)
RBC: 7.02 MIL/uL — ABNORMAL HIGH (ref 4.22–5.81)
RDW: 21.7 % — ABNORMAL HIGH (ref 11.5–15.5)
WBC Count: 8.4 10*3/uL (ref 4.0–10.5)
nRBC: 0 % (ref 0.0–0.2)

## 2020-05-21 NOTE — Patient Instructions (Signed)

## 2020-05-21 NOTE — Assessment & Plan Note (Addendum)
Due to testosterone replacement therapy with androgen. Previous work-up 2019: MPN panel: Negative for Jak 2, MPL, CALR Erythropoietin 11.3, hemoglobin 19.9  Current treatment: Phlebotomies every 2 weeks started 11/11/2018 switched to monthly phlebotomies 02/21/2019 switch to every 2 months 05/22/2019, then switched to every 3 months 11/2019  Lab review:  His hemoglobin is 16 today, previously 15.  He will undergo phlebotomy today and again in 3 months.  We will recheck his hemoglobin in 3 months prior to the phlebotomy to ensure his hemoglobin isn't higher than it is today.  He will see Dr. Pamelia Hoit in December, or sooner if needed.  I reviewed the above plan with the patient and he is in agreement.

## 2020-05-21 NOTE — Progress Notes (Signed)
[facility] Follow up:    Tamsen Roers, MD [address] [place] [address]   DIAGNOSIS: Secondary erythrocytosis  SUMMARY OF HEMATOLOGIC HISTORY: Due to testosterone replacement therapy with androgen. Previous work-up 2019: MPN panel: Negative for Jak 2, MPL, CALR Erythropoietin 11.3, hemoglobin 19.9 Undergoing phlebotomies to maintain hemoglobin at 15  CURRENT THERAPY: phlebotomy  INTERVAL HISTORY: Sergio [name] 69 y.o. male returns for evaluation of his erythrocytosis.  This is secondary to testosterone replacement therapy.  He continues on testosterone replacement.  He follows with his PCP regularly and is up to date with his cancer screenings.  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  He notes he is feeling well today.  He tolerates his phlebotomies well without any issues.  He denies any symptoms today.   Patient Active Problem List   Diagnosis Date Noted  . 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  MEDICAL HISTORY: Past Medical History:  Diagnosis Date  . Hypertension   . Low testosterone     SURGICAL HISTORY: Past Surgical History:  Procedure Laterality Date  . RETINAL DETACHMENT SURGERY Right     SOCIAL HISTORY: Social History   Socioeconomic History  . Marital status: Married    Spouse name: [name]  . Number of children: 2  . Years of education: Not on file  . Highest education level: Not on file  Occupational History  . Not on file  Tobacco Use  . Smoking status: Never Smoker  . Smokeless tobacco: Never Used  Substance and Sexual Activity  . Alcohol use: Never  . Drug use: Never  . Sexual activity: Yes  Other Topics Concern  . Not on file  Social History Narrative  . Not on file   Social Determinants of Health   Financial Resource Strain:   . Difficulty of Paying Living Expenses:   Food Insecurity:   . Worried About Charity fundraiser in the Last Year:   . Academic librarian in the Last Year:   Transportation Needs:   . Film/video editor (Medical):   Marland Kitchen Lack of Transportation (Non-Medical):   Physical Activity:   . Days of Exercise per Week:   . Minutes of Exercise per Session:   Stress:   . Feeling of Stress :   Social Connections:   . Frequency of Communication with Friends and Family:   . Frequency of Social Gatherings with Friends and Family:   . Attends Religious Services:   . Active Member of Clubs or Organizations:   . Attends Archivist Meetings:   Marland Kitchen Marital Status:   Intimate Partner Violence:   . Fear of Current or Ex-Partner:   . Emotionally Abused:   Marland Kitchen Physically Abused:   . Sexually Abused:     FAMILY HISTORY: Family History  Problem Relation Age of Onset  . Heart failure Mother   . Heart failure Father     Review of Systems  Constitutional: Negative for appetite change, chills, fatigue, fever and unexpected weight change.  HENT:   Negative for hearing loss, lump/mass, sore throat and trouble swallowing.   Eyes: Negative for eye problems and icterus.  Respiratory: Negative for chest tightness, cough and shortness of breath.   Cardiovascular: Negative for chest pain, leg swelling and palpitations.  Gastrointestinal: Negative for abdominal distention, abdominal pain, constipation, diarrhea, nausea and vomiting.  Endocrine: Negative for hot flashes.  Genitourinary: Negative for difficulty urinating.  Musculoskeletal: Negative for arthralgias.  Skin: Negative for itching and rash.  Neurological: Negative for dizziness, extremity weakness, headaches and numbness.  Hematological: Negative for adenopathy. Does not bruise/bleed easily.  Psychiatric/Behavioral: Negative for depression. The patient is not nervous/anxious.       PHYSICAL EXAMINATION  ECOG PERFORMANCE STATUS: 0 - Asymptomatic  Vitals:   05/21/20 0939  BP: (!) 148/98  Pulse: 81  Resp: 17  Temp: 99.1 F (37.3 C)  SpO2: 99%    Physical  Exam Constitutional:      General: He is not in acute distress.    Appearance: Normal appearance. He is not toxic-appearing.  HENT:     Head: Normocephalic and atraumatic.  Eyes:     General: No scleral icterus. Cardiovascular:     Rate and Rhythm: Normal rate and regular rhythm.     Pulses: Normal pulses.     Heart sounds: Normal heart sounds.  Pulmonary:     Effort: Pulmonary effort is normal.     Breath sounds: Normal breath sounds.  Abdominal:     General: Abdomen is flat. Bowel sounds are normal.     Palpations: Abdomen is soft.  Musculoskeletal:        General: No swelling.     Cervical back: Neck supple.  Lymphadenopathy:     Cervical: No cervical adenopathy.  Skin:    General: Skin is warm and dry.     Capillary Refill: Capillary refill takes less than 2 seconds.     Findings: No rash.  Neurological:     General: No focal deficit present.     Mental Status: He is alert.  Psychiatric:        Mood and Affect: Mood normal.        Behavior: Behavior normal.     LABORATORY DATA:  CBC    Component Value Date/Time   WBC 8.4 05/21/2020 0919   RBC 7.02 (H) 05/21/2020 0919   HGB 16.0 05/21/2020 0919   HCT 50.1 05/21/2020 0919   PLT 179 05/21/2020 0919   MCV 71.4 (L) 05/21/2020 0919   MCH 22.8 (L) 05/21/2020 0919   MCHC 31.9 05/21/2020 0919   RDW 21.7 (H) 05/21/2020 0919   LYMPHSABS 2.1 05/21/2020 0919   MONOABS 0.8 05/21/2020 0919   EOSABS 0.2 05/21/2020 0919   BASOSABS 0.0 05/21/2020 0919    CMP     Component Value Date/Time   NA 134 (L) 10/24/2018 1310   K 3.5 10/24/2018 1310   CL 100 10/24/2018 1310   CO2 24 10/24/2018 1310   GLUCOSE 102 (H) 10/24/2018 1310   BUN 16 10/24/2018 1310   CREATININE 1.05 10/24/2018 1310   CALCIUM 9.2 10/24/2018 1310   PROT 7.1 10/24/2018 1310   ALBUMIN 3.9 10/24/2018 1310   AST 26 10/24/2018 1310   ALT 31 10/24/2018 1310   ALKPHOS 52 10/24/2018 1310   BILITOT 1.4 (H) 11/07/2018 0958   GFRNONAA >60 10/24/2018 1310    GFRAA >60 10/24/2018 1310    ASSESSMENT and THERAPY PLAN:   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 Previous work-up 2019: MPN panel: Negative for Jak 2, MPL, CALR Erythropoietin 11.3, hemoglobin 19.9  Current treatment: Phlebotomies every 2 weeks started 11/11/2018 switched to monthly phlebotomies 02/21/2019 switch to every 2 months 05/22/2019, then switched to every 3 months 11/2019  Lab review:  His hemoglobin is 16 today, previously 15.  He will undergo phlebotomy today and again in 3 months.  We will recheck his hemoglobin  in 3 months prior to the phlebotomy to ensure his hemoglobin isn't higher than it is today.  He will see Dr. Pamelia Hoit in December, or sooner if needed.  I reviewed the above plan with the patient and he is in agreement.    All questions were answered. The patient knows to call the clinic with any problems, questions or concerns. We can certainly see the patient much sooner if necessary.  Total encounter time: 20 minutes  Lillard Anes, NP 05/21/20 9:59 AM Medical Oncology and Hematology [facility] [address] [place], [place] [address] Tel. [phone]    Fax. [phone]  *Total Encounter Time as defined by the Centers for Medicare and Medicaid Services includes, in addition to the face-to-face time of a patient visit (documented in the note above) non-face-to-face time: obtaining and reviewing outside history, ordering and reviewing medications, tests or procedures, care coordination (communications with other health care professionals or caregivers) and documentation in the medical record.

## 2020-05-21 NOTE — Progress Notes (Signed)
Phlebotomy started at 1000 with 18G in L AC for 15 minutes. 565g collected. Pt VSS. Nutrition given. Pt waited 25 min. Observation time.

## 2020-05-22 ENCOUNTER — Telehealth: Payer: Self-pay | Admitting: Hematology and Oncology

## 2020-05-22 NOTE — Telephone Encounter (Signed)
Scheduled appts per 6/15 los. Pt's spouse confirmed appt dates and times.

## 2020-08-21 ENCOUNTER — Inpatient Hospital Stay: Payer: Medicare HMO | Attending: Hematology and Oncology

## 2020-08-21 ENCOUNTER — Inpatient Hospital Stay: Payer: Medicare HMO

## 2020-08-21 ENCOUNTER — Other Ambulatory Visit: Payer: Self-pay

## 2020-08-21 DIAGNOSIS — D751 Secondary polycythemia: Secondary | ICD-10-CM | POA: Insufficient documentation

## 2020-08-21 LAB — CBC WITH DIFFERENTIAL (CANCER CENTER ONLY)
Abs Immature Granulocytes: 0.03 10*3/uL (ref 0.00–0.07)
Basophils Absolute: 0.1 10*3/uL (ref 0.0–0.1)
Basophils Relative: 1 %
Eosinophils Absolute: 0.2 10*3/uL (ref 0.0–0.5)
Eosinophils Relative: 3 %
HCT: 52.5 % — ABNORMAL HIGH (ref 39.0–52.0)
Hemoglobin: 17.1 g/dL — ABNORMAL HIGH (ref 13.0–17.0)
Immature Granulocytes: 1 %
Lymphocytes Relative: 30 %
Lymphs Abs: 1.9 10*3/uL (ref 0.7–4.0)
MCH: 23.7 pg — ABNORMAL LOW (ref 26.0–34.0)
MCHC: 32.6 g/dL (ref 30.0–36.0)
MCV: 72.6 fL — ABNORMAL LOW (ref 80.0–100.0)
Monocytes Absolute: 0.6 10*3/uL (ref 0.1–1.0)
Monocytes Relative: 9 %
Neutro Abs: 3.6 10*3/uL (ref 1.7–7.7)
Neutrophils Relative %: 56 %
Platelet Count: 159 10*3/uL (ref 150–400)
RBC: 7.23 MIL/uL — ABNORMAL HIGH (ref 4.22–5.81)
RDW: 20.2 % — ABNORMAL HIGH (ref 11.5–15.5)
WBC Count: 6.3 10*3/uL (ref 4.0–10.5)
nRBC: 0 % (ref 0.0–0.2)

## 2020-08-21 NOTE — Patient Instructions (Signed)
Therapeutic Phlebotomy Therapeutic phlebotomy is the planned removal of blood from a person's body for the purpose of treating a medical condition. The procedure is similar to donating blood. Usually, about a pint (470 mL, or 0.47 L) of blood is removed. The average adult has 9-12 pints (4.3-5.7 L) of blood in the body. Therapeutic phlebotomy may be used to treat the following medical conditions:  Hemochromatosis. This is a condition in which the blood contains too much iron.  Polycythemia vera. This is a condition in which the blood contains too many red blood cells.  Porphyria cutanea tarda. This is a disease in which an important part of hemoglobin is not made properly. It results in the buildup of abnormal amounts of porphyrins in the body.  Sickle cell disease. This is a condition in which the red blood cells form an abnormal crescent shape rather than a round shape. Tell a health care provider about:  Any allergies you have.  All medicines you are taking, including vitamins, herbs, eye drops, creams, and over-the-counter medicines.  Any problems you or family members have had with anesthetic medicines.  Any blood disorders you have.  Any surgeries you have had.  Any medical conditions you have.  Whether you are pregnant or may be pregnant. What are the risks? Generally, this is a safe procedure. However, problems may occur, including:  Nausea or light-headedness.  Low blood pressure (hypotension).  Soreness, bleeding, swelling, or bruising at the needle insertion site.  Infection. What happens before the procedure?  Follow instructions from your health care provider about eating or drinking restrictions.  Ask your health care provider about: ? Changing or stopping your regular medicines. This is especially important if you are taking diabetes medicines or blood thinners (anticoagulants). ? Taking medicines such as aspirin and ibuprofen. These medicines can thin your  blood. Do not take these medicines unless your health care provider tells you to take them. ? Taking over-the-counter medicines, vitamins, herbs, and supplements.  Wear clothing with sleeves that can be raised above the elbow.  Plan to have someone take you home from the hospital or clinic.  You may have a blood sample taken.  Your blood pressure, pulse rate, and breathing rate will be measured. What happens during the procedure?   To lower your risk of infection: ? Your health care team will wash or sanitize their hands. ? Your skin will be cleaned with an antiseptic.  You may be given a medicine to numb the area (local anesthetic).  A tourniquet will be placed on your arm.  A needle will be inserted into one of your veins.  Tubing and a collection bag will be attached to that needle.  Blood will flow through the needle and tubing into the collection bag.  The collection bag will be placed lower than your arm to allow gravity to help the flow of blood into the bag.  You may be asked to open and close your hand slowly and continually during the entire collection.  After the specified amount of blood has been removed from your body, the collection bag and tubing will be clamped.  The needle will be removed from your vein.  Pressure will be held on the site of the needle insertion to stop the bleeding.  A bandage (dressing) will be placed over the needle insertion site. The procedure may vary among health care providers and hospitals. What happens after the procedure?  Your blood pressure, pulse rate, and breathing rate will be   measured after the procedure.  You will be encouraged to drink fluids.  Your recovery will be assessed and monitored.  You can return to your normal activities as told by your health care provider. Summary  Therapeutic phlebotomy is the planned removal of blood from a person's body for the purpose of treating a medical condition.  Therapeutic  phlebotomy may be used to treat hemochromatosis, polycythemia vera, porphyria cutanea tarda, or sickle cell disease.  In the procedure, a needle is inserted and about a pint (470 mL, or 0.47 L) of blood is removed. The average adult has 9-12 pints (4.3-5.7 L) of blood in the body.  This is generally a safe procedure, but it can sometimes cause problems such as nausea, light-headedness, or low blood pressure (hypotension). This information is not intended to replace advice given to you by your health care provider. Make sure you discuss any questions you have with your health care provider. Document Revised: 12/09/2017 Document Reviewed: 12/09/2017 Elsevier Patient Education  2020 [employer]  

## 2020-10-06 ENCOUNTER — Ambulatory Visit (HOSPITAL_COMMUNITY)
Admission: EM | Admit: 2020-10-06 | Discharge: 2020-10-06 | Disposition: A | Discharge status: Home / Self Care | Payer: Medicare HMO | Attending: Family Medicine | Admitting: Family Medicine

## 2020-10-06 ENCOUNTER — Encounter (HOSPITAL_COMMUNITY): Payer: Self-pay | Admitting: Emergency Medicine

## 2020-10-06 ENCOUNTER — Other Ambulatory Visit: Payer: Self-pay

## 2020-10-06 DIAGNOSIS — R350 Frequency of micturition: Secondary | ICD-10-CM

## 2020-10-06 DIAGNOSIS — M2550 Pain in unspecified joint: Secondary | ICD-10-CM | POA: Diagnosis not present

## 2020-10-06 DIAGNOSIS — N410 Acute prostatitis: Secondary | ICD-10-CM

## 2020-10-06 DIAGNOSIS — R300 Dysuria: Secondary | ICD-10-CM

## 2020-10-06 DIAGNOSIS — R3912 Poor urinary stream: Secondary | ICD-10-CM

## 2020-10-06 DIAGNOSIS — R6883 Chills (without fever): Secondary | ICD-10-CM | POA: Diagnosis not present

## 2020-10-06 LAB — POCT URINALYSIS DIPSTICK, ED / UC
Bilirubin Urine: NEGATIVE
Glucose, UA: NEGATIVE mg/dL
Hgb urine dipstick: NEGATIVE
Ketones, ur: NEGATIVE mg/dL
Leukocytes,Ua: NEGATIVE
Nitrite: NEGATIVE
Protein, ur: NEGATIVE mg/dL
Specific Gravity, Urine: 1.02 (ref 1.005–1.030)
Urobilinogen, UA: 0.2 mg/dL (ref 0.0–1.0)
pH: 7.5 (ref 5.0–8.0)

## 2020-10-06 MED ORDER — TAMSULOSIN HCL 0.4 MG PO CAPS
0.4000 mg | ORAL_CAPSULE | Freq: Every day | ORAL | 0 refills | Status: DC
Start: 2020-10-06 — End: 2020-11-20

## 2020-10-06 MED ORDER — CIPROFLOXACIN HCL 500 MG PO TABS: 500.0000 mg | ORAL_TABLET | Freq: Two times a day (BID) | ORAL | 0 refills | Status: DC

## 2020-10-06 NOTE — ED Triage Notes (Signed)
Pt presents with joint pain/ body aches, chills and Dysuria xs 5-7 days.

## 2020-10-06 NOTE — ED Provider Notes (Signed)
[facility]   CC: UTI  SUBJECTIVE:  Sergio [name] is a 69 y.o. male who complains of urinary frequency, urgency and dysuria for the past week. Reports weak stream and foul smelling urine as well. Patient denies a precipitating event, recent sexual encounter, excessive caffeine intake. Localizes the pain to the lower abdomen. Pain is intermittent and describes it as burning. Has not tried OTC medications without relief. Symptoms are made worse with urination. Also reports chills, joint pain and body aches over the last week as well. Denies fever, nausea, vomiting, flank pain ROS: As in HPI.  All other pertinent ROS negative.     Past Medical History:  Diagnosis Date  . Hypertension   . Low testosterone    Past Surgical History:  Procedure Laterality Date  . RETINAL DETACHMENT SURGERY Right    No Known Allergies No current facility-administered medications on file prior to encounter.   Current Outpatient Medications on File Prior to Encounter  Medication Sig Dispense Refill  . aspirin EC 81 MG tablet Take 81 mg by mouth daily.    . hydrochlorothiazide (HYDRODIURIL) 25 MG tablet TAKE 1 TABLET BY MOUTH DAILY WITH LISINOPRIL  1  . lisinopril (PRINIVIL,ZESTRIL) 10 MG tablet Take 10 mg by mouth daily.  3  . Multiple Vitamin (MULTIVITAMIN) tablet Take 4 tablets by mouth daily.    Marland Kitchen testosterone (ANDROGEL) 50 MG/5GM (1%) GEL Place onto the skin daily. Testosterone gel    . HYDROcodone-homatropine (HYCODAN) 5-1.5 MG/5ML syrup Take 5 mLs by mouth at bedtime as needed for cough. 75 mL 0  . NON FORMULARY Testosterone gel     Social History   Socioeconomic History  . Marital status: Married    Spouse name: [name]  . Number of children: 2  . Years of education: Not on file  . Highest education level: Not on file  Occupational History  . Not on file  Tobacco Use  . Smoking status: Never Smoker  . Smokeless tobacco: Never Used  Substance and Sexual Activity  . Alcohol  use: Never  . Drug use: Never  . Sexual activity: Yes  Other Topics Concern  . Not on file  Social History Narrative  . Not on file   Social Determinants of Health   Financial Resource Strain:   . Difficulty of Paying Living Expenses: Not on file  Food Insecurity:   . Worried About Programme researcher, broadcasting/film/video in the Last Year: Not on file  . Ran Out of Food in the Last Year: Not on file  Transportation Needs:   . Lack of Transportation (Medical): Not on file  . Lack of Transportation (Non-Medical): Not on file  Physical Activity:   . Days of Exercise per Week: Not on file  . Minutes of Exercise per Session: Not on file  Stress:   . Feeling of Stress : Not on file  Social Connections:   . Frequency of Communication with Friends and Family: Not on file  . Frequency of Social Gatherings with Friends and Family: Not on file  . Attends Religious Services: Not on file  . Active Member of Clubs or Organizations: Not on file  . Attends Banker Meetings: Not on file  . Marital Status: Not on file  Intimate Partner Violence:   . Fear of Current or Ex-Partner: Not on file  . Emotionally Abused: Not on file  . Physically Abused: Not on file  . Sexually Abused: Not on file   Family History  Problem Relation Age of Onset  . Heart failure Mother   . Heart failure Father     OBJECTIVE:  Vitals:   10/06/20 1047  BP: (!) 155/90  Pulse: 86  Resp: 18  Temp: 99 F (37.2 C)  TempSrc: Oral  SpO2: 98%   General appearance: AOx3 in no acute distress HEENT: NCAT. Oropharynx clear.  Lungs: clear to auscultation bilaterally without adventitious breath sounds Heart: regular rate and rhythm. Radial pulses 2+ symmetrical bilaterally Abdomen: soft; non-distended; no tenderness; bowel sounds present; no guarding or rebound tenderness GU: declined prostate exam Back: no CVA tenderness Extremities: no edema; symmetrical with no gross deformities Skin: warm and dry Neurologic:  Ambulates from chair to exam table without difficulty Psychological: alert and cooperative; normal mood and affect  Labs Reviewed  URINE CULTURE  POCT URINALYSIS DIPSTICK, ED / UC    ASSESSMENT & PLAN:  1. Acute prostatitis   2. Dysuria   3. Weak urinary stream     Meds ordered this encounter  Medications  . ciprofloxacin (CIPRO) 500 MG tablet    Sig: Take 1 tablet (500 mg total) by mouth 2 (two) times daily.    Dispense:  14 tablet    Refill:  0    Order Specific Question:   Supervising Provider    Answer:   Merrilee Jansky [id number]  . tamsulosin (FLOMAX) 0.4 MG CAPS capsule    Sig: Take 1 capsule (0.4 mg total) by mouth daily.    Dispense:  30 capsule    Refill:  0    Order Specific Question:   Supervising Provider    Answer:   Merrilee Jansky [id number]    Urine culture sent  We will call you with abnormal results that need further treatment Prescribed Cipro Prescribed flomax Differential includes kidney stones, UTI, prostatitis Push fluids and get plenty of rest Take antibiotic as directed and to completion Take pyridium as prescribed and as needed for symptomatic relief Follow up with PCP if symptoms persists Return here or go to ER if you have any new or worsening symptoms such as fever, worsening abdominal pain, nausea/vomiting, flank pain  Outlined signs and symptoms indicating need for more acute intervention Patient verbalized understanding After Visit Summary given     Moshe Cipro, NP 10/06/20 1413

## 2020-10-06 NOTE — Discharge Instructions (Addendum)
I have sent in Cipro for you to take twice a day for 7 days  I have sent in flomax for you to take as well  Follow up with this office or with primary care if symptoms are persisting.  Follow up in the ER for high fever, trouble swallowing, trouble breathing, other concerning symptoms.

## 2020-10-07 LAB — URINE CULTURE: Culture: 60000 — AB

## 2020-10-18 LAB — HM COLONOSCOPY

## 2020-11-19 NOTE — Progress Notes (Signed)
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 He presents to the clinic todayfor phlebotomy.   Is tolerating phlebotomy treatment extremely well without any problems or concerns.  Denies any signs or symptoms of stroke or heart attacks.  He is working for the fire department and stays very busy.  ALLERGIES:  has No Known Allergies.  MEDICATIONS:  Current Outpatient Medications  Medication Sig Dispense Refill  . aspirin EC 81 MG tablet Take 81 mg by mouth daily.    . ciprofloxacin (CIPRO) 500 MG tablet Take 1 tablet (500 mg total) by mouth 2 (two) times daily. 14 tablet 0  . hydrochlorothiazide (HYDRODIURIL) 25 MG tablet TAKE 1 TABLET BY MOUTH DAILY WITH LISINOPRIL  1  . HYDROcodone-homatropine (HYCODAN) 5-1.5 MG/5ML syrup Take 5 mLs by mouth at bedtime as needed for cough. 75 mL 0  . lisinopril (PRINIVIL,ZESTRIL) 10 MG tablet Take 10 mg by mouth daily.  3  . Multiple Vitamin (MULTIVITAMIN) tablet Take 4 tablets by mouth daily.    . NON FORMULARY Testosterone gel    . tamsulosin (FLOMAX) 0.4 MG CAPS capsule Take 1 capsule (0.4 mg total) by mouth daily. 30 capsule 0  . testosterone (ANDROGEL) 50 MG/5GM (1%) GEL Place onto the skin daily. Testosterone gel     No current facility-administered medications for this visit.    PHYSICAL EXAMINATION: ECOG PERFORMANCE STATUS: 1 - Symptomatic but completely ambulatory  Vitals:   11/20/20 0831  BP: (!) 150/90  Pulse: 81  Resp: 18  Temp: 98.3 F (36.8 C)  SpO2: 99%   Filed Weights   11/20/20 0831   Weight: 183 lb 6.4 oz (83.2 kg)     LABORATORY DATA:  I have reviewed the data as listed CMP Latest Ref Rng & Units 11/07/2018 10/24/2018  Glucose 70 - 99 mg/dL - 283(M)  BUN 8 - 23 mg/dL - 16  Creatinine 6.29 - 1.24 mg/dL - 4.76  Sodium 546 - 503 mmol/L - 134(L)  Potassium 3.5 - 5.1 mmol/L - 3.5  Chloride 98 - 111 mmol/L - 100  CO2 22 - 32 mmol/L - 24  Calcium 8.9 - 10.3 mg/dL - 9.2  Total Protein 6.5 - 8.1 g/dL - 7.1  Total Bilirubin 0.3 - 1.2 mg/dL 5.4(S) 5.6(C)  Alkaline Phos 38 - 126 U/L - 52  AST 15 - 41 U/L - 26  ALT 0 - 44 U/L - 31    Lab Results  Component Value Date   WBC 6.4 11/20/2020   HGB 16.5 11/20/2020   HCT 49.8 11/20/2020   MCV 73.7 (L) 11/20/2020   PLT 182 11/20/2020   NEUTROABS 3.7 11/20/2020    ASSESSMENT & PLAN:  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 Previous work-up 2019: MPN panel: Negative for Jak 2, MPL, CALR Erythropoietin 11.3, hemoglobin 19.9  Current treatment: Phlebotomies every 2 weeks started 12/6/2019switched to monthly phlebotomies 02/21/2019 switch to every 2 months 05/22/2019, switched to every 34-month starting 11/20/2019  Lab review:Hemoglobin 16.5 today   Ref. 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 Plan: Every 3 months phlebotomies.  He will now undergo phlebotomies at [facility] or 1 blood. This way they can use the blood for other patients.  Return to clinic in 6 months with labs and follow-up    No orders of the defined types were placed in this encounter.  The patient has a good understanding of the overall plan. he agrees with it. he will call with any problems that may develop before the next visit here.  Total time spent: 20 mins including face to face  time and time spent for planning, charting and coordination of care  Serena Croissant, MD 11/20/2020  I, Kirt Boys Dorshimer, am acting as scribe for Dr. Serena Croissant.  I have reviewed the above documentation for accuracy and completeness, and I agree with the above.

## 2020-11-20 ENCOUNTER — Other Ambulatory Visit: Payer: Self-pay

## 2020-11-20 ENCOUNTER — Inpatient Hospital Stay: Payer: Medicare HMO

## 2020-11-20 ENCOUNTER — Other Ambulatory Visit: Payer: Self-pay | Admitting: Hematology and Oncology

## 2020-11-20 ENCOUNTER — Inpatient Hospital Stay: Payer: Medicare HMO | Attending: Hematology and Oncology | Admitting: Hematology and Oncology

## 2020-11-20 DIAGNOSIS — D751 Secondary polycythemia: Secondary | ICD-10-CM | POA: Diagnosis present

## 2020-11-20 DIAGNOSIS — Z7982 Long term (current) use of aspirin: Secondary | ICD-10-CM | POA: Diagnosis not present

## 2020-11-20 LAB — CBC WITH DIFFERENTIAL (CANCER CENTER ONLY)
Abs Immature Granulocytes: 0.03 10*3/uL (ref 0.00–0.07)
Basophils Absolute: 0 10*3/uL (ref 0.0–0.1)
Basophils Relative: 1 %
Eosinophils Absolute: 0.2 10*3/uL (ref 0.0–0.5)
Eosinophils Relative: 3 %
HCT: 49.8 % (ref 39.0–52.0)
Hemoglobin: 16.5 g/dL (ref 13.0–17.0)
Immature Granulocytes: 1 %
Lymphocytes Relative: 29 %
Lymphs Abs: 1.9 10*3/uL (ref 0.7–4.0)
MCH: 24.4 pg — ABNORMAL LOW (ref 26.0–34.0)
MCHC: 33.1 g/dL (ref 30.0–36.0)
MCV: 73.7 fL — ABNORMAL LOW (ref 80.0–100.0)
Monocytes Absolute: 0.6 10*3/uL (ref 0.1–1.0)
Monocytes Relative: 10 %
Neutro Abs: 3.7 10*3/uL (ref 1.7–7.7)
Neutrophils Relative %: 56 %
Platelet Count: 182 10*3/uL (ref 150–400)
RBC: 6.76 MIL/uL — ABNORMAL HIGH (ref 4.22–5.81)
RDW: 19.5 % — ABNORMAL HIGH (ref 11.5–15.5)
WBC Count: 6.4 10*3/uL (ref 4.0–10.5)
nRBC: 0 % (ref 0.0–0.2)

## 2020-11-20 MED ORDER — SILODOSIN 8 MG PO CAPS: 8.0000 mg | ORAL_CAPSULE | Freq: Every day | ORAL | Status: AC

## 2020-11-20 NOTE — Assessment & Plan Note (Signed)
Due to testosterone replacement therapy with androgen. Previous work-up 2019: MPN panel: Negative for Jak 2, MPL, CALR Erythropoietin 11.3, hemoglobin 19.9  Current treatment: Phlebotomies every 2 weeks started 12/6/2019switched to monthly phlebotomies 02/21/2019 switch to every 2 months 05/22/2019, switched to every 75-month starting 11/20/2019  Lab review:   Ref. 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 Plan: Every 3 months phlebotomies Return to clinic in 6 months with labs and follow-up

## 2020-12-14 ENCOUNTER — Other Ambulatory Visit: Payer: Self-pay

## 2020-12-14 ENCOUNTER — Ambulatory Visit (INDEPENDENT_AMBULATORY_CARE_PROVIDER_SITE_OTHER): Payer: Medicare HMO

## 2020-12-14 ENCOUNTER — Ambulatory Visit
Admission: RE | Admit: 2020-12-14 | Discharge: 2020-12-14 | Disposition: A | Discharge status: Home / Self Care | Payer: Medicare HMO | Source: Ambulatory Visit | Attending: Emergency Medicine | Admitting: Emergency Medicine

## 2020-12-14 VITALS — BP 144/92 | HR 88 | Temp 98.3°F | Resp 18

## 2020-12-14 DIAGNOSIS — R059 Cough, unspecified: Secondary | ICD-10-CM | POA: Diagnosis not present

## 2020-12-14 DIAGNOSIS — R0602 Shortness of breath: Secondary | ICD-10-CM

## 2020-12-14 DIAGNOSIS — Z20822 Contact with and (suspected) exposure to covid-19: Secondary | ICD-10-CM | POA: Diagnosis not present

## 2020-12-14 DIAGNOSIS — J189 Pneumonia, unspecified organism: Secondary | ICD-10-CM

## 2020-12-14 MED ORDER — AMOXICILLIN 500 MG PO CAPS: 1000.0000 mg | ORAL_CAPSULE | Freq: Three times a day (TID) | ORAL | 0 refills | Status: AC

## 2020-12-14 MED ORDER — AZITHROMYCIN 250 MG PO TABS
250.0000 mg | ORAL_TABLET | Freq: Every day | ORAL | 0 refills | Status: DC
Start: 2020-12-14 — End: 2021-05-21

## 2020-12-14 MED ORDER — ALBUTEROL SULFATE HFA 108 (90 BASE) MCG/ACT IN AERS: 1.0000 | INHALATION_SPRAY | Freq: Four times a day (QID) | RESPIRATORY_TRACT | 0 refills | Status: DC | PRN

## 2020-12-14 MED ORDER — BENZONATATE 200 MG PO CAPS: 200.0000 mg | ORAL_CAPSULE | Freq: Three times a day (TID) | ORAL | 0 refills | Status: AC | PRN

## 2020-12-14 NOTE — Discharge Instructions (Signed)
Covid test pending, monitor my chart for results Chest x-ray showing early pneumonia Begin amoxicillin 1 g 3 times a day for 1 week, azithromycin 2 tablets today, 1 tablet for the following 4 days Tessalon for cough every 8 hours Albuterol inhaler as needed for any shortness of breath chest tightness, wheezing Follow-up if not improving or worsening

## 2020-12-14 NOTE — ED Provider Notes (Signed)
[facility]    CSN: [id number] Arrival date & time: 12/14/20  9628      History   Chief Complaint Chief Complaint  Patient presents with  . Cough  . Appointment    1000    HPI Sergio [name] is a 70 y.o. male history of hypertension presenting today for evaluation of a cough.  Reports that he has had some chest tightness with the cough.  Reports symptoms began approximately 4 days ago, worsening productive cough with discolored sputum.  Reports shortness of breath with exertion.  Denies tobacco use.  HPI  Past Medical History:  Diagnosis Date  . Hypertension   . Low testosterone     Patient Active Problem List   Diagnosis Date Noted  . 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 RETINAL DETACHMENT SURGERY Right        Home Medications    Prior to Admission medications   Medication Sig Start Date End Date Taking? Authorizing Provider  albuterol (VENTOLIN HFA) 108 (90 Base) MCG/ACT inhaler Inhale 1-2 puffs into the lungs every 6 (six) hours as needed for wheezing or shortness of breath. 12/14/20  Yes Lou Irigoyen C, PA-C  amoxicillin (AMOXIL) 500 MG capsule Take 2 capsules (1,000 mg total) by mouth 3 (three) times daily for 7 days. 12/14/20 12/21/20 Yes Oney Folz C, PA-C  azithromycin (ZITHROMAX) 250 MG tablet Take 1 tablet (250 mg total) by mouth daily. Take first 2 tablets together, then 1 every day until finished. 12/14/20  Yes Haily Caley C, PA-C  benzonatate (TESSALON) 200 MG capsule Take 1 capsule (200 mg total) by mouth 3 (three) times daily as needed for up to 7 days for cough. 12/14/20 12/21/20 Yes Aiyah Scarpelli C, PA-C  aspirin EC 81 MG tablet Take 81 mg by mouth daily.    [provider]  hydrochlorothiazide (HYDRODIURIL) 25 MG tablet TAKE 1 TABLET BY MOUTH DAILY WITH LISINOPRIL 08/01/18   [provider]  lisinopril (PRINIVIL,ZESTRIL) 10 MG tablet Take 10 mg by mouth  daily. 09/26/18   [provider]  Multiple Vitamin (MULTIVITAMIN) tablet Take 4 tablets by mouth daily.    [provider]  NON FORMULARY Testosterone gel    [provider]  silodosin (RAPAFLO) 8 MG CAPS capsule Take 1 capsule (8 mg total) by mouth daily with breakfast. 11/20/20   Serena Croissant, MD  testosterone (ANDROGEL) 50 MG/5GM (1%) GEL Place onto the skin daily. Testosterone gel    [provider]    Family History Family History  Problem Relation Age of Onset  . Heart failure Mother   . Heart failure Father     Social History Social History   Tobacco Use  . Smoking status: Never Smoker  . Smokeless tobacco: Never Used  Substance Use Topics  . Alcohol use: Never  . Drug use: Never     Allergies   Patient has no known allergies.   Review of Systems Review of Systems  Constitutional: Negative for activity change, appetite change, chills, fatigue and fever.  HENT: Positive for congestion. Negative for ear pain, rhinorrhea, sinus pressure, sore throat and trouble swallowing.   Eyes: Negative for discharge and redness.  Respiratory: Positive for cough, chest tightness and shortness of breath.   Cardiovascular: Negative for chest pain.  Gastrointestinal: Negative for abdominal pain, diarrhea, nausea and vomiting.  Musculoskeletal: Negative for myalgias.  Skin: Negative for rash.  Neurological: Negative for dizziness, light-headedness and  headaches.     Physical Exam Triage Vital Signs ED Triage Vitals [12/14/20 1005]  Enc Vitals Group     BP (!) 144/92     Pulse Rate 88     Resp 18     Temp 98.3 F (36.8 C)     Temp Source Oral     SpO2 96 %     Weight      Height      Head Circumference      Peak Flow      Pain Score      Pain Loc      Pain Edu?      Excl. in GC?    No data found.  Updated Vital Signs BP (!) 144/92 (BP Location: Left Arm)   Pulse 88   Temp 98.3 F (36.8 C) (Oral)   Resp 18   SpO2 96%    Visual Acuity Right Eye Distance:   Left Eye Distance:   Bilateral Distance:    Right Eye Near:   Left Eye Near:    Bilateral Near:     Physical Exam Vitals and nursing note reviewed.  Constitutional:      Appearance: He is well-developed and well-nourished.     Comments: No acute distress  HENT:     Head: Normocephalic and atraumatic.     Ears:     Comments: Bilateral ears without tenderness to palpation of external auricle, tragus and mastoid, EAC's without erythema or swelling, TM's with good bony landmarks and cone of light. Non erythematous.     Nose: Nose normal.     Mouth/Throat:     Comments: Oral mucosa pink and moist, no tonsillar enlargement or exudate. Posterior pharynx patent and nonerythematous, no uvula deviation or swelling. Normal phonation. Eyes:     Conjunctiva/sclera: Conjunctivae normal.  Cardiovascular:     Rate and Rhythm: Normal rate.  Pulmonary:     Effort: Pulmonary effort is normal. No respiratory distress.     Comments: Breathing comfortably at rest, CTABL, no wheezing, rales or other adventitious sounds auscultated Abdominal:     General: There is no distension.  Musculoskeletal:        General: Normal range of motion.     Cervical back: Neck supple.  Skin:    General: Skin is warm and dry.  Neurological:     Mental Status: He is alert and oriented to person, place, and time.  Psychiatric:        Mood and Affect: Mood and affect normal.      UC Treatments / Results  Labs (all labs ordered are listed, but only abnormal results are displayed) Labs Reviewed  NOVEL CORONAVIRUS, NAA    EKG   Radiology DG Chest 2 View  Result Date: 12/14/2020 CLINICAL DATA:  Cough and shortness of breath. EXAM: CHEST - 2 VIEW COMPARISON:  None. FINDINGS: Mild opacity in left base. The heart, hila, mediastinum, lungs, and pleura are otherwise unremarkable. IMPRESSION: Mild opacity in left base may represent atelectasis or early pneumonia. Recommend  follow-up to resolution. Electronically Signed   By: Gerome Sam III M.D   On: 12/14/2020 10:31    Procedures Procedures (including critical care time)  Medications Ordered in UC Medications - No data to display  Initial Impression / Assessment and Plan / UC Course  I have reviewed the triage vital signs and the nursing notes.  Pertinent labs & imaging results that were available during my care of the patient were reviewed by  me and considered in my medical decision making (see chart for details).     Left basilar early pneumonia, treated with amoxicillin azithromycin, Tessalon for cough, albuterol inhaler as needed for shortness of breath/chest tightness, no obvious wheezing, deferring steroids at this time.  Screening for Covid.  Discussed strict return precautions. Patient verbalized understanding and is agreeable with plan.  Final Clinical Impressions(s) / UC Diagnoses   Final diagnoses:  Encounter for screening laboratory testing for COVID-19 virus  Community acquired pneumonia of left lower lobe of lung     Discharge Instructions     Covid test pending, monitor my chart for results Chest x-ray showing early pneumonia Begin amoxicillin 1 g 3 times a day for 1 week, azithromycin 2 tablets today, 1 tablet for the following 4 days Tessalon for cough every 8 hours Albuterol inhaler as needed for any shortness of breath chest tightness, wheezing Follow-up if not improving or worsening    ED Prescriptions    Medication Sig Dispense Auth. Provider   amoxicillin (AMOXIL) 500 MG capsule Take 2 capsules (1,000 mg total) by mouth 3 (three) times daily for 7 days. 42 capsule Mallorie Norrod C, PA-C   azithromycin (ZITHROMAX) 250 MG tablet Take 1 tablet (250 mg total) by mouth daily. Take first 2 tablets together, then 1 every day until finished. 6 tablet Lexia Vandevender C, PA-C   benzonatate (TESSALON) 200 MG capsule Take 1 capsule (200 mg total) by mouth 3 (three) times daily  as needed for up to 7 days for cough. 28 capsule Cristine Daw C, PA-C   albuterol (VENTOLIN HFA) 108 (90 Base) MCG/ACT inhaler Inhale 1-2 puffs into the lungs every 6 (six) hours as needed for wheezing or shortness of breath. 8 g Auren Valdes, [place] C, PA-C     PDMP not reviewed this encounter.   Lew Dawes, PA-C 12/14/20 1104

## 2020-12-14 NOTE — ED Triage Notes (Signed)
Pt here for cough, SOB and pain with inspiration x 3 days

## 2020-12-17 LAB — NOVEL CORONAVIRUS, NAA: SARS-CoV-2, NAA: DETECTED — AB

## 2021-04-11 ENCOUNTER — Ambulatory Visit
Admission: RE | Admit: 2021-04-11 | Discharge: 2021-04-11 | Disposition: A | Discharge status: Home / Self Care | Payer: Medicare HMO | Attending: Emergency Medicine | Admitting: Emergency Medicine

## 2021-04-11 ENCOUNTER — Other Ambulatory Visit: Payer: Self-pay

## 2021-04-11 ENCOUNTER — Ambulatory Visit
Admission: RE | Admit: 2021-04-11 | Discharge: 2021-04-11 | Disposition: A | Discharge status: Home / Self Care | Payer: Medicare HMO | Source: Ambulatory Visit | Attending: Emergency Medicine | Admitting: Emergency Medicine

## 2021-04-11 ENCOUNTER — Ambulatory Visit
Admission: EM | Admit: 2021-04-11 | Discharge: 2021-04-11 | Disposition: A | Discharge status: Home / Self Care | Payer: Medicare HMO | Attending: Emergency Medicine | Admitting: Emergency Medicine

## 2021-04-11 DIAGNOSIS — Z8701 Personal history of pneumonia (recurrent): Secondary | ICD-10-CM | POA: Insufficient documentation

## 2021-04-11 DIAGNOSIS — Z1152 Encounter for screening for COVID-19: Secondary | ICD-10-CM

## 2021-04-11 DIAGNOSIS — R058 Other specified cough: Secondary | ICD-10-CM | POA: Insufficient documentation

## 2021-04-11 DIAGNOSIS — R0989 Other specified symptoms and signs involving the circulatory and respiratory systems: Secondary | ICD-10-CM | POA: Diagnosis not present

## 2021-04-11 DIAGNOSIS — R030 Elevated blood-pressure reading, without diagnosis of hypertension: Secondary | ICD-10-CM

## 2021-04-11 MED ORDER — BENZONATATE 100 MG PO CAPS
100.0000 mg | ORAL_CAPSULE | Freq: Three times a day (TID) | ORAL | 0 refills | Status: DC | PRN
Start: 2021-04-11 — End: 2021-05-21

## 2021-04-11 NOTE — ED Triage Notes (Signed)
Pt reports having a productive cough and congestion x8 days. Also reports feeling SOB with exertion.

## 2021-04-11 NOTE — ED Provider Notes (Signed)
Sergio [name]    CSN: [id number] Arrival date & time: 04/11/21  1207      History   Chief Complaint Chief Complaint  Patient presents with  . Cough    HPI Sergio [name] is a 70 y.o. male.   Patient presents with 8-day history of productive cough and chest congestion.  He also reports shortness of breath with exertion.  He denies fever, chills, vomiting, diarrhea, or other symptoms.  Patient had COVID with pneumonia in January 2022.  He also has history of hypertension and has not taken his medications yet today.    The history is provided by the patient and medical records.    Past Medical History:  Diagnosis Date  . Hypertension   . Low testosterone     Patient Active Problem List   Diagnosis Date Noted  . 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 RETINAL DETACHMENT SURGERY Right        Home Medications    Prior to Admission medications   Medication Sig Start Date End Date Taking? Authorizing Provider  benzonatate (TESSALON) 100 MG capsule Take 1 capsule (100 mg total) by mouth 3 (three) times daily as needed for cough. 04/11/21  Yes Mickie Bail, NP  albuterol (VENTOLIN HFA) 108 (90 Base) MCG/ACT inhaler Inhale 1-2 puffs into the lungs every 6 (six) hours as needed for wheezing or shortness of breath. 12/14/20   Wieters, Hallie C, PA-C  aspirin EC 81 MG tablet Take 81 mg by mouth daily.    [provider]  azithromycin (ZITHROMAX) 250 MG tablet Take 1 tablet (250 mg total) by mouth daily. Take first 2 tablets together, then 1 every day until finished. 12/14/20   Wieters, Hallie C, PA-C  hydrochlorothiazide (HYDRODIURIL) 25 MG tablet TAKE 1 TABLET BY MOUTH DAILY WITH LISINOPRIL 08/01/18   [provider]  lisinopril (PRINIVIL,ZESTRIL) 10 MG tablet Take 10 mg by mouth daily. 09/26/18   [provider]  Multiple Vitamin (MULTIVITAMIN) tablet Take 4 tablets by mouth daily.    [provider]  NON FORMULARY Testosterone gel    [provider]  silodosin (RAPAFLO) 8 MG CAPS capsule Take 1 capsule (8 mg total) by mouth daily with breakfast. 11/20/20   Serena Croissant, MD  testosterone (ANDROGEL) 50 MG/5GM (1%) GEL Place onto the skin daily. Testosterone gel    [provider]    Family History Family History  Problem Relation Age of Onset  . Heart failure Mother   . Heart failure Father     Social History Social History   Tobacco Use  . Smoking status: Never Smoker  . Smokeless tobacco: Never Used  Substance Use Topics  . Alcohol use: Never  . Drug use: Never     Allergies   Patient has no known allergies.   Review of Systems Review of Systems  Constitutional: Negative for chills and fever.  HENT: Positive for congestion. Negative for ear pain and sore throat.   Respiratory: Positive for cough and shortness of breath.   Cardiovascular: Negative for chest pain and palpitations.  Gastrointestinal: Negative for abdominal pain and vomiting.  Skin: Negative for color change and rash.  All other systems reviewed and are negative.    Physical Exam Triage Vital Signs ED Triage Vitals  Enc Vitals Group     BP      Pulse      Resp      Temp  Temp src      SpO2      Weight      Height      Head Circumference      Peak Flow      Pain Score      Pain Loc      Pain Edu?      Excl. in GC?    No data found.  Updated Vital Signs BP (!) 164/108   Pulse 82   Temp 98.4 F (36.9 C) (Oral)   Resp 18   Ht 5\' 9"  (1.753 m)   Wt 184 lb (83.5 kg)   SpO2 96%   BMI 27.17 kg/m   Visual Acuity Right Eye Distance:   Left Eye Distance:   Bilateral Distance:    Right Eye Near:   Left Eye Near:    Bilateral Near:     Physical Exam Vitals and nursing note reviewed.  Constitutional:      General: He is not in acute distress.    Appearance: He is well-developed. He is not ill-appearing.  HENT:     Head: Normocephalic and  atraumatic.     Right Ear: Tympanic membrane normal.     Left Ear: Tympanic membrane normal.     Nose: Nose normal.     Mouth/Throat:     Mouth: Mucous membranes are moist.     Pharynx: Oropharynx is clear.  Eyes:     Conjunctiva/sclera: Conjunctivae normal.  Cardiovascular:     Rate and Rhythm: Normal rate and regular rhythm.     Heart sounds: Normal heart sounds.  Pulmonary:     Effort: Pulmonary effort is normal. No respiratory distress.     Breath sounds: Normal breath sounds.  Abdominal:     Palpations: Abdomen is soft.     Tenderness: There is no abdominal tenderness.  Musculoskeletal:     Cervical back: Neck supple.  Skin:    General: Skin is warm and dry.  Neurological:     General: No focal deficit present.     Mental Status: He is alert and oriented to person, place, and time.     Gait: Gait normal.  Psychiatric:        Mood and Affect: Mood normal.        Behavior: Behavior normal.      UC Treatments / Results  Labs (all labs ordered are listed, but only abnormal results are displayed) Labs Reviewed  NOVEL CORONAVIRUS, NAA    EKG   Radiology DG Chest 2 View  Result Date: 04/11/2021 CLINICAL DATA:  History of pneumonia in January of this year. Congestion and productive cough. EXAM: CHEST - 2 VIEW COMPARISON:  PA and lateral chest 12/14/2020. FINDINGS: Lungs clear. Heart size normal. No pneumothorax or pleural effusion. No acute or focal bony abnormality. IMPRESSION: Negative chest. Electronically Signed   By: 02/11/2021 M.D.   On: 04/11/2021 13:25    Procedures Procedures (including critical care time)  Medications Ordered in UC Medications - No data to display  Initial Impression / Assessment and Plan / UC Course  I have reviewed the triage vital signs and the nursing notes.  Pertinent labs & imaging results that were available during my care of the patient were reviewed by me and considered in my medical decision making (see chart for  details).   Productive cough, COVID-19 test.  Elevated blood pressure reading.  Chest x-ray negative; discussed results with patient via telephone.  Treating cough with Tessalon Perles.  PCR COVID pending.  Instructed patient to self quarantine until the test result is back.  Instructed him to follow-up with his PCP or return here if his symptoms are not improving.  Also discussed that his blood pressure is elevated today and needs to be rechecked by his PCP in 1 to 2 weeks.  Education on managing hypertension provided.  Patient agrees to plan of care.   Final Clinical Impressions(s) / UC Diagnoses   Final diagnoses:  Encounter for screening for COVID-19  Productive cough  Elevated blood pressure reading     Discharge Instructions     Go to [facility] for your chest x-ray.  I will call you with the results this afternoon.  Your blood pressure is elevated today at 164/108.  Please have this rechecked by your primary care provider in 1-2 weeks.         ED Prescriptions    Medication Sig Dispense Auth. Provider   benzonatate (TESSALON) 100 MG capsule Take 1 capsule (100 mg total) by mouth 3 (three) times daily as needed for cough. 21 capsule Mickie Bail, NP     PDMP not reviewed this encounter.   Mickie Bail, NP 04/11/21 [phone]

## 2021-04-11 NOTE — Discharge Instructions (Addendum)
Go to [facility] for your chest x-ray.  I will call you with the results this afternoon.  Your blood pressure is elevated today at 164/108.  Please have this rechecked by your primary care provider in 1-2 weeks.

## 2021-04-12 LAB — SARS-COV-2, NAA 2 DAY TAT

## 2021-04-12 LAB — NOVEL CORONAVIRUS, NAA: SARS-CoV-2, NAA: NOT DETECTED

## 2021-05-20 NOTE — Progress Notes (Signed)
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 He presents to the clinic today for follow-up.  He is doing quite well without any new problems or concerns.  He is doing ph[facility]botomies intermitt[facility] with [facility].  ALLERGIES:  has No Known Allergies.  MEDICATIONS:  Current Outpatient Medications  Medication Sig Dispense Refill   sildenafil (REVATIO) 20 MG tablet Take 1 tablet (20 mg total) by mouth as needed. 10 tablet 0   aspirin EC 81 MG tablet Take 81 mg by mouth daily.     hydrochlorothiazide (HYDRODIURIL) 25 MG tablet TAKE 1 TABLET BY MOUTH DAILY WITH LISINOPRIL  1   lisinopril (PRINIVIL,ZESTRIL) 10 MG tablet Take 10 mg by mouth daily.  3   Multiple Vitamin (MULTIVITAMIN) tablet Take 4 tablets by mouth daily.     NON FORMULARY Testosterone gel     silodosin (RAPAFLO) 8 MG CAPS capsule Take 1 capsule (8 mg total) by mouth daily with breakfast. 30 capsule    testosterone (ANDROGEL) 50 MG/5GM (1%) GEL Place onto the skin daily. Testosterone gel     No current facility-administered medications for this visit.    PHYSICAL EXAMINATION: ECOG PERFORMANCE STATUS: 1 - Symptomatic but completely ambulatory  Vitals:   05/21/21 0806  BP: (!) 141/97  Pulse: 65  Resp: 18  Temp: 97.7 F (36.5 C)  SpO2: 98%   Filed Weights   05/21/21 0806  Weight: 178 lb 11.2 oz (81.1 kg)    LABORATORY DATA:  I have reviewed the data as listed CMP Latest Ref Rng & Units 11/07/2018 10/24/2018  Glucose 70 - 99 mg/dL - 010(U)  BUN 8 - 23 mg/dL  - 16  Creatinine 7.25 - 1.24 mg/dL - 3.66  Sodium 440 - 347 mmol/L - 134(L)  Potassium 3.5 - 5.1 mmol/L - 3.5  Chloride 98 - 111 mmol/L - 100  CO2 22 - 32 mmol/L - 24  Calcium 8.9 - 10.3 mg/dL - 9.2  Total Protein 6.5 - 8.1 g/dL - 7.1  Total Bilirubin 0.3 - 1.2 mg/dL 4.2(V) 9.5(G)  Alkaline Phos 38 - 126 U/L - 52  AST 15 - 41 U/L - 26  ALT 0 - 44 U/L - 31    Lab Results  Component Value Date   WBC 5.9 05/21/2021   HGB 16.9 05/21/2021   HCT 50.5 05/21/2021   MCV 73.9 (L) 05/21/2021   PLT 162 05/21/2021   NEUTROABS 3.3 05/21/2021    ASSESSMENT & PLAN:  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  He will now undergo phlebotomies at [facility] or 1 blood.  Return to clinic in 6 months for follow-up with labs    Orders Placed This Encounter  Procedures  CBC with Differential (Cancer Center Only)    Standing Status:   Future    Standing Expiration Date:   05/21/2022    The patient has a good understanding of the overall plan. he agrees with it. he will call with any problems that may develop before the next visit here.  Total time spent: 20 mins including face to face time and time spent for planning, charting and coordination of care  Sabas Sous, MD, MPH 05/21/2021  I, Molly Dorshimer, am acting as scribe for Dr. Serena Croissant.  I have reviewed the above documentation for accuracy and completeness, and I agree with the above.

## 2021-05-20 NOTE — Assessment & Plan Note (Signed)
Due to testosterone replacement therapy with androgen. 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  Plan:Every 3 months phlebotomies.  He will now undergo phlebotomies at [facility] or 1 blood.  Return to clinic in 6 months for follow-up with labs

## 2021-05-21 ENCOUNTER — Inpatient Hospital Stay: Payer: Medicare HMO | Attending: Hematology and Oncology | Admitting: Hematology and Oncology

## 2021-05-21 ENCOUNTER — Inpatient Hospital Stay: Payer: Medicare HMO

## 2021-05-21 ENCOUNTER — Other Ambulatory Visit: Payer: Self-pay

## 2021-05-21 ENCOUNTER — Other Ambulatory Visit: Payer: Self-pay | Admitting: Hematology and Oncology

## 2021-05-21 DIAGNOSIS — D751 Secondary polycythemia: Secondary | ICD-10-CM

## 2021-05-21 LAB — CBC WITH DIFFERENTIAL (CANCER CENTER ONLY)
Abs Immature Granulocytes: 0.02 10*3/uL (ref 0.00–0.07)
Basophils Absolute: 0 10*3/uL (ref 0.0–0.1)
Basophils Relative: 1 %
Eosinophils Absolute: 0.1 10*3/uL (ref 0.0–0.5)
Eosinophils Relative: 1 %
HCT: 50.5 % (ref 39.0–52.0)
Hemoglobin: 16.9 g/dL (ref 13.0–17.0)
Immature Granulocytes: 0 %
Lymphocytes Relative: 32 %
Lymphs Abs: 1.9 10*3/uL (ref 0.7–4.0)
MCH: 24.7 pg — ABNORMAL LOW (ref 26.0–34.0)
MCHC: 33.5 g/dL (ref 30.0–36.0)
MCV: 73.9 fL — ABNORMAL LOW (ref 80.0–100.0)
Monocytes Absolute: 0.6 10*3/uL (ref 0.1–1.0)
Monocytes Relative: 9 %
Neutro Abs: 3.3 10*3/uL (ref 1.7–7.7)
Neutrophils Relative %: 57 %
Platelet Count: 162 10*3/uL (ref 150–400)
RBC: 6.83 MIL/uL — ABNORMAL HIGH (ref 4.22–5.81)
RDW: 19.2 % — ABNORMAL HIGH (ref 11.5–15.5)
WBC Count: 5.9 10*3/uL (ref 4.0–10.5)
nRBC: 0 % (ref 0.0–0.2)

## 2021-05-21 MED ORDER — SILDENAFIL CITRATE 20 MG PO TABS: 20.0000 mg | ORAL_TABLET | ORAL | 0 refills | Status: AC | PRN

## 2021-06-16 ENCOUNTER — Encounter: Payer: Self-pay | Admitting: Hematology and Oncology

## 2021-07-08 ENCOUNTER — Encounter: Payer: Self-pay | Admitting: Family

## 2021-07-09 ENCOUNTER — Ambulatory Visit (INDEPENDENT_AMBULATORY_CARE_PROVIDER_SITE_OTHER): Payer: Medicare HMO | Admitting: Family

## 2021-07-09 ENCOUNTER — Telehealth: Payer: Self-pay

## 2021-07-09 ENCOUNTER — Encounter: Payer: Self-pay | Admitting: *Deleted

## 2021-07-09 ENCOUNTER — Encounter: Payer: Self-pay | Admitting: Family

## 2021-07-09 ENCOUNTER — Other Ambulatory Visit: Payer: Self-pay

## 2021-07-09 VITALS — BP 130/88 | HR 77 | Temp 97.3°F | Resp 16 | Ht 69.0 in | Wt 176.2 lb

## 2021-07-09 DIAGNOSIS — I10 Essential (primary) hypertension: Secondary | ICD-10-CM

## 2021-07-09 DIAGNOSIS — S0091XA Abrasion of unspecified part of head, initial encounter: Secondary | ICD-10-CM

## 2021-07-09 DIAGNOSIS — N529 Male erectile dysfunction, unspecified: Secondary | ICD-10-CM | POA: Diagnosis not present

## 2021-07-09 DIAGNOSIS — D751 Secondary polycythemia: Secondary | ICD-10-CM

## 2021-07-09 DIAGNOSIS — Z6826 Body mass index (BMI) 26.0-26.9, adult: Secondary | ICD-10-CM

## 2021-07-09 DIAGNOSIS — E663 Overweight: Secondary | ICD-10-CM

## 2021-07-09 DIAGNOSIS — N400 Enlarged prostate without lower urinary tract symptoms: Secondary | ICD-10-CM | POA: Diagnosis not present

## 2021-07-09 DIAGNOSIS — Z23 Encounter for immunization: Secondary | ICD-10-CM | POA: Diagnosis not present

## 2021-07-09 MED ORDER — TETANUS-DIPHTH-ACELL PERTUSSIS 5-2.5-18.5 LF-MCG/0.5 IM SUSP: 0.5000 mL | Freq: Once | INTRAMUSCULAR | 0 refills | Status: AC

## 2021-07-09 MED ORDER — TESTOSTERONE CYPIONATE 100 MG/ML IJ SOLN: INTRAMUSCULAR | 3 refills | Status: DC

## 2021-07-09 NOTE — Telephone Encounter (Signed)
Patient called and stated that his MyChart is not working correctly and he called the help line for MyChart to help him sign in. He states that he was able to sign into MyChart but its a lock or something that is preventing him from accessing his information in MyChart. I asked patient what was he looking for off of his MyChart and he might have to reach out to the [employer] again. Patient states "okay thank you" and hung up the phone.

## 2021-07-09 NOTE — Progress Notes (Signed)
Provider: Marlowe Sax FNP-C   Ngetich, Nelda Bucks, NP  Patient Care Team: Ngetich, Nelda Bucks, NP as PCP - General (Family Medicine) Nicholas Lose, MD as Consulting Physician (Hematology and Oncology)  Extended Emergency Contact Information Primary Emergency Contact: Sciuto,[name] Mobile Phone: [phone] Relation: Spouse Secondary Emergency Contact: [place] Mobile Phone: [phone] Relation: Daughter  Code Status:  Full Code  Goals of care: Advanced Directive information Advanced Directives 07/08/2021  Does Patient Have a Medical Advance Directive? No  Type of Advance Directive -  Copy of [place] in Chart? -  Would patient like information on creating a medical advance directive? No - Patient declined     Chief Complaint  Patient presents with   Establish Care    New Patient.    HPI:  Pt is a Sergio [name] seen today to Establish Care at the practice for medical management of chronic diseases. He has a medical history Hypertension,Erectile dysfunction,BPH among other conditions.  He hit his head at the Hospital while trying to plug in a vital sign machine hit his head on a sanitizer dispenser.PA available applied bandage.He works with EMS. He took off band aid last night and site was bleeding and slight tenderness.No bleeding today.   His hemoglobin usual high latest on chart 16.9; 17.1 and in the 20's in the past.Follows up with Hematology Oncologist Dr.Gudena vinay for secondary erythrocytosis  due to testosterone replacement therapy.He undergoes Phlebotomies since 11/11/2018 last 05/21/2021.He was advised to [facility].Next follow with Oncologist in 6 months.    Past Medical History:  Diagnosis Date   Enlarged prostate    Erectile dysfunction    History of colonoscopy    History of COVID-19    Hypertension    Low testosterone    Low testosterone    Past Surgical History:   Procedure Laterality Date   RETINAL DETACHMENT SURGERY Right     No Known Allergies  Allergies as of 07/09/2021   No Known Allergies      Medication List        Accurate as of July 09, 2021  9:17 AM. If you have any questions, ask your nurse or doctor.          aspirin EC 81 MG tablet Take 81 mg by mouth daily.   hydrochlorothiazide 25 MG tablet Commonly known as: HYDRODIURIL TAKE 1 TABLET BY MOUTH DAILY WITH LISINOPRIL   lisinopril 10 MG tablet Commonly known as: ZESTRIL Take 10 mg by mouth daily.   multivitamin tablet Take 4 tablets by mouth daily.   sildenafil 20 MG tablet Commonly known as: REVATIO Take 1 tablet (20 mg total) by mouth as needed.   silodosin 8 MG Caps capsule Commonly known as: RAPAFLO Take 1 capsule (8 mg total) by mouth daily with breakfast.   testosterone 50 MG/5GM (1%) Gel Commonly known as: ANDROGEL Place onto the skin daily. Testosterone gel        Review of Systems  Constitutional:  Negative for appetite change, chills, fatigue, fever and unexpected weight change.  HENT:  Negative for congestion, dental problem, ear discharge, ear pain, facial swelling, hearing loss, nosebleeds, postnasal drip, rhinorrhea, sinus pressure, sinus pain, sneezing, sore throat, tinnitus and trouble swallowing.   Eyes:  Negative for pain, discharge, redness, itching and visual disturbance.  Respiratory:  Negative for cough, chest tightness, shortness of breath and wheezing.   Cardiovascular:  Negative for chest pain, palpitations and leg  swelling.  Gastrointestinal:  Negative for abdominal distention, abdominal pain, blood in stool, constipation, diarrhea, nausea and vomiting.  Endocrine: Negative for cold intolerance, heat intolerance, polydipsia, polyphagia and polyuria.  Genitourinary:  Negative for difficulty urinating, dysuria, flank pain, frequency and urgency.       ED  BPH   Musculoskeletal:  Negative for arthralgias, back pain, gait problem,  joint swelling, myalgias, neck pain and neck stiffness.  Skin:  Negative for color change, pallor, rash and wound.  Neurological:  Negative for dizziness, syncope, speech difficulty, weakness, light-headedness, numbness and headaches.  Hematological:  Does not bruise/bleed easily.  Psychiatric/Behavioral:  Negative for agitation, behavioral problems, confusion, hallucinations, self-injury, sleep disturbance and suicidal ideas. The patient is not nervous/anxious.     There is no immunization history on file for this patient. Pertinent  Health Maintenance Due  Topic Date Due   COLONOSCOPY (Pts 45-32yr Insurance coverage will need to be confirmed)  Never done   PNA vac Low Risk Adult (1 of 2 - PCV13) Never done   INFLUENZA VACCINE  07/07/2021   Fall Risk  07/08/2021  Falls in the past year? 0  Number falls in past yr: 0  Injury with Fall? 0  Risk for fall due to : No Fall Risks  Follow up Falls evaluation completed   Functional Status Survey:    Vitals:   07/09/21 0900  BP: 130/88  Pulse: 77  Resp: 16  Temp: (!) 97.3 F (36.3 C)  SpO2: 96%  Weight: 176 lb 3.2 oz (79.9 kg)  Height: 5' 9" (1.753 m)   Body mass index is 26.02 kg/m. Physical Exam Vitals reviewed.  Constitutional:      General: He is not in acute distress.    Appearance: Normal appearance. He is normal weight. He is not ill-appearing or diaphoretic.  HENT:     Head: Normocephalic.     Right Ear: Tympanic membrane, ear canal and external ear normal. There is no impacted cerumen.     Left Ear: Tympanic membrane, ear canal and external ear normal. There is no impacted cerumen.     Nose: Nose normal. No congestion or rhinorrhea.     Mouth/Throat:     Mouth: Mucous membranes are moist.     Pharynx: Oropharynx is clear. No oropharyngeal exudate or posterior oropharyngeal erythema.  Eyes:     General: No scleral icterus.       Right eye: No discharge.        Left eye: No discharge.     Extraocular Movements:  Extraocular movements intact.     Conjunctiva/sclera: Conjunctivae normal.     Pupils: Pupils are equal, round, and reactive to light.  Neck:     Vascular: No carotid bruit.  Cardiovascular:     Rate and Rhythm: Normal rate and regular rhythm.     Pulses: Normal pulses.     Heart sounds: Normal heart sounds. No murmur heard.   No friction rub. No gallop.  Pulmonary:     Effort: Pulmonary effort is normal. No respiratory distress.     Breath sounds: Normal breath sounds. No wheezing, rhonchi or rales.  Chest:     Chest wall: No tenderness.  Abdominal:     General: Bowel sounds are normal. There is no distension.     Palpations: Abdomen is soft. There is no mass.     Tenderness: There is no abdominal tenderness. There is no right CVA tenderness, left CVA tenderness, guarding or rebound.  Musculoskeletal:  General: No swelling or tenderness. Normal range of motion.     Cervical back: Normal range of motion. No rigidity or tenderness.     Right lower leg: No edema.     Left lower leg: No edema.  Lymphadenopathy:     Cervical: No cervical adenopathy.  Skin:    General: Skin is warm and dry.     Coloration: Skin is not pale.     Findings: No bruising, erythema, lesion or rash.     Comments: Small abrasion on crown of the head cleansed with saline,pat dry,triple antibiotic ointment applied and covered with bandage.  Neurological:     Mental Status: He is alert and oriented to person, place, and time.     Cranial Nerves: No cranial nerve deficit.     Sensory: No sensory deficit.     Motor: No weakness.     Coordination: Coordination normal.     Gait: Gait normal.  Psychiatric:        Mood and Affect: Mood normal.        Speech: Speech normal.        Behavior: Behavior normal.        Thought Content: Thought content normal.        Judgment: Judgment normal.    Labs reviewed: No results for input(s): NA, K, CL, CO2, GLUCOSE, BUN, CREATININE, CALCIUM, MG, PHOS in the last  8760 hours. No results for input(s): AST, ALT, ALKPHOS, BILITOT, PROT, ALBUMIN in the last 8760 hours. Recent Labs    08/21/20 0737 11/20/20 0810 05/21/21 0755  WBC 6.3 6.4 5.9  NEUTROABS 3.6 3.7 3.3  HGB 17.1* 16.5 16.9  HCT 52.5* 49.8 50.5  MCV 72.6* 73.7* 73.9*  PLT 159 182 162   No results found for: TSH No results found for: HGBA1C No results found for: CHOL, HDL, LDLCALC, LDLDIRECT, TRIG, CHOLHDL  Significant Diagnostic Results in last 30 days:  No results found.  Assessment/Plan  1. Essential hypertension B/p well controlled  Continue on Hydrochlorothiazide and Lisinopril  - on ASA  - CBC with Differential/Platelet - CMP with eGFR([facility]) - TSH - Lipid Panel  2. Erectile dysfunction, unspecified erectile dysfunction type Continue on sildenafil as needed  - Testosterone Cypionate 100 MG/ML SOLN; 10% GEL. 4 clicks daily.  Dispense: 100 mL; Refill: 3 - Testosterone Total,Free,Bio, Males  3. Need for Tdap vaccination Advised to get Tdap vaccine at his pharmacy script send today  - Tdap ([facility]) 5-2.5-18.5 LF-MCG/0.5 injection; Inject 0.5 mLs into the muscle once for 1 dose.  Dispense: 0.5 mL; Refill: 0  4. Benign prostatic hyperplasia without lower urinary tract symptoms Asymptomatic  - continue on silodosin  - PSA  5. Abrasion of head, initial encounter Small abrasion on crown of the head cleansed with saline,pat dry,triple antibiotic ointment applied and covered with bandage.Advised to cleanse abrasion with saline ,pat dry,apply triple antibiotics ointment and cover with band aid until healed.monitor for signs of infection.  6. Secondary erythrocytosis Latest Hgb 16.1  [facility] months as directed by Oncologist  - continue to follow up with Hematology Oncologist   7. Overweight with body mass index (BMI) 25.0-29.9 BMI 26.02  Dietary modification and exercise   8. Body mass index 26.0-26.9, adult BMI 26.02   Dietary and exercise as above   Family/ staff Communication: Reviewed plan of care with patient verbalized understanding.  Labs/tests ordered:  - CBC with Differential/Platelet - CMP with eGFR([facility]) - TSH - Hgb A1C -  Lipid panel - PSA  Next Appointment : 6 months for medical management of chronic issues. Fasting Labs prior to visit today.    Sandrea Hughs, NP

## 2021-07-10 LAB — LIPID PANEL
Cholesterol: 171 mg/dL (ref <200)
HDL: 47 mg/dL (ref >=40)
LDL Cholesterol (Calc): 103 mg/dL (calc) — ABNORMAL HIGH
Non-HDL Cholesterol (Calc): 124 mg/dL (calc) (ref <130)
Total CHOL/HDL Ratio: 3.6 (calc) (ref <5.0)
Triglycerides: 117 mg/dL (ref <150)

## 2021-07-10 LAB — PSA: PSA: 7.36 ng/mL — ABNORMAL HIGH (ref <=4.00)

## 2021-07-10 LAB — CBC WITH DIFFERENTIAL/PLATELET
Absolute Monocytes: 494 cells/uL (ref 200–950)
Basophils Absolute: 43 cells/uL (ref 0–200)
Basophils Relative: 0.7 %
Eosinophils Absolute: 79 cells/uL (ref 15–500)
Eosinophils Relative: 1.3 %
HCT: 55.1 % — ABNORMAL HIGH (ref 38.5–50.0)
Hemoglobin: 17.7 g/dL — ABNORMAL HIGH (ref 13.2–17.1)
Lymphs Abs: 1897 cells/uL (ref 850–3900)
MCH: 24.5 pg — ABNORMAL LOW (ref 27.0–33.0)
MCHC: 32.1 g/dL (ref 32.0–36.0)
MCV: 76.3 fL — ABNORMAL LOW (ref 80.0–100.0)
MPV: 10.5 fL (ref 7.5–12.5)
Monocytes Relative: 8.1 %
Neutro Abs: 3587 cells/uL (ref 1500–7800)
Neutrophils Relative %: 58.8 %
Platelets: 185 10*3/uL (ref 140–400)
RBC: 7.22 10*6/uL — ABNORMAL HIGH (ref 4.20–5.80)
RDW: 18.6 % — ABNORMAL HIGH (ref 11.0–15.0)
Total Lymphocyte: 31.1 %
WBC: 6.1 10*3/uL (ref 3.8–10.8)

## 2021-07-10 LAB — COMPLETE METABOLIC PANEL WITH GFR
AG Ratio: 1.8 (calc) (ref 1.0–2.5)
ALT: 21 U/L (ref 9–46)
AST: 21 U/L (ref 10–35)
Albumin: 4.4 g/dL (ref 3.6–5.1)
Alkaline phosphatase (APISO): 41 U/L (ref 35–144)
BUN: 10 mg/dL (ref 7–25)
CO2: 24 mmol/L (ref 20–32)
Calcium: 9.4 mg/dL (ref 8.6–10.3)
Chloride: 97 mmol/L — ABNORMAL LOW (ref 98–110)
Creat: 0.89 mg/dL (ref 0.70–1.35)
Globulin: 2.5 g/dL (calc) (ref 1.9–3.7)
Glucose, Bld: 80 mg/dL (ref 65–99)
Potassium: 3.7 mmol/L (ref 3.5–5.3)
Sodium: 133 mmol/L — ABNORMAL LOW (ref 135–146)
Total Bilirubin: 1.8 mg/dL — ABNORMAL HIGH (ref 0.2–1.2)
Total Protein: 6.9 g/dL (ref 6.1–8.1)
eGFR: 93 mL/min/{1.73_m2} (ref >=60)

## 2021-07-10 LAB — TESTOSTERONE TOTAL,FREE,BIO, MALES
Albumin: 4.4 g/dL (ref 3.6–5.1)
Sex Hormone Binding: 26 nmol/L (ref 22–77)
Testosterone: 223 ng/dL — ABNORMAL LOW (ref 250–827)

## 2021-07-10 LAB — TSH: TSH: 1.07 mIU/L (ref 0.40–4.50)

## 2021-07-11 ENCOUNTER — Other Ambulatory Visit: Payer: Self-pay

## 2021-07-11 DIAGNOSIS — I10 Essential (primary) hypertension: Secondary | ICD-10-CM

## 2021-07-29 IMAGING — DX DG CHEST 2V
2 series · 2 of 2 positions shown · non-contrast
Comparison: None.

CLINICAL DATA: Cough and shortness of breath.

EXAM:
CHEST - 2 VIEW

[chest pa]
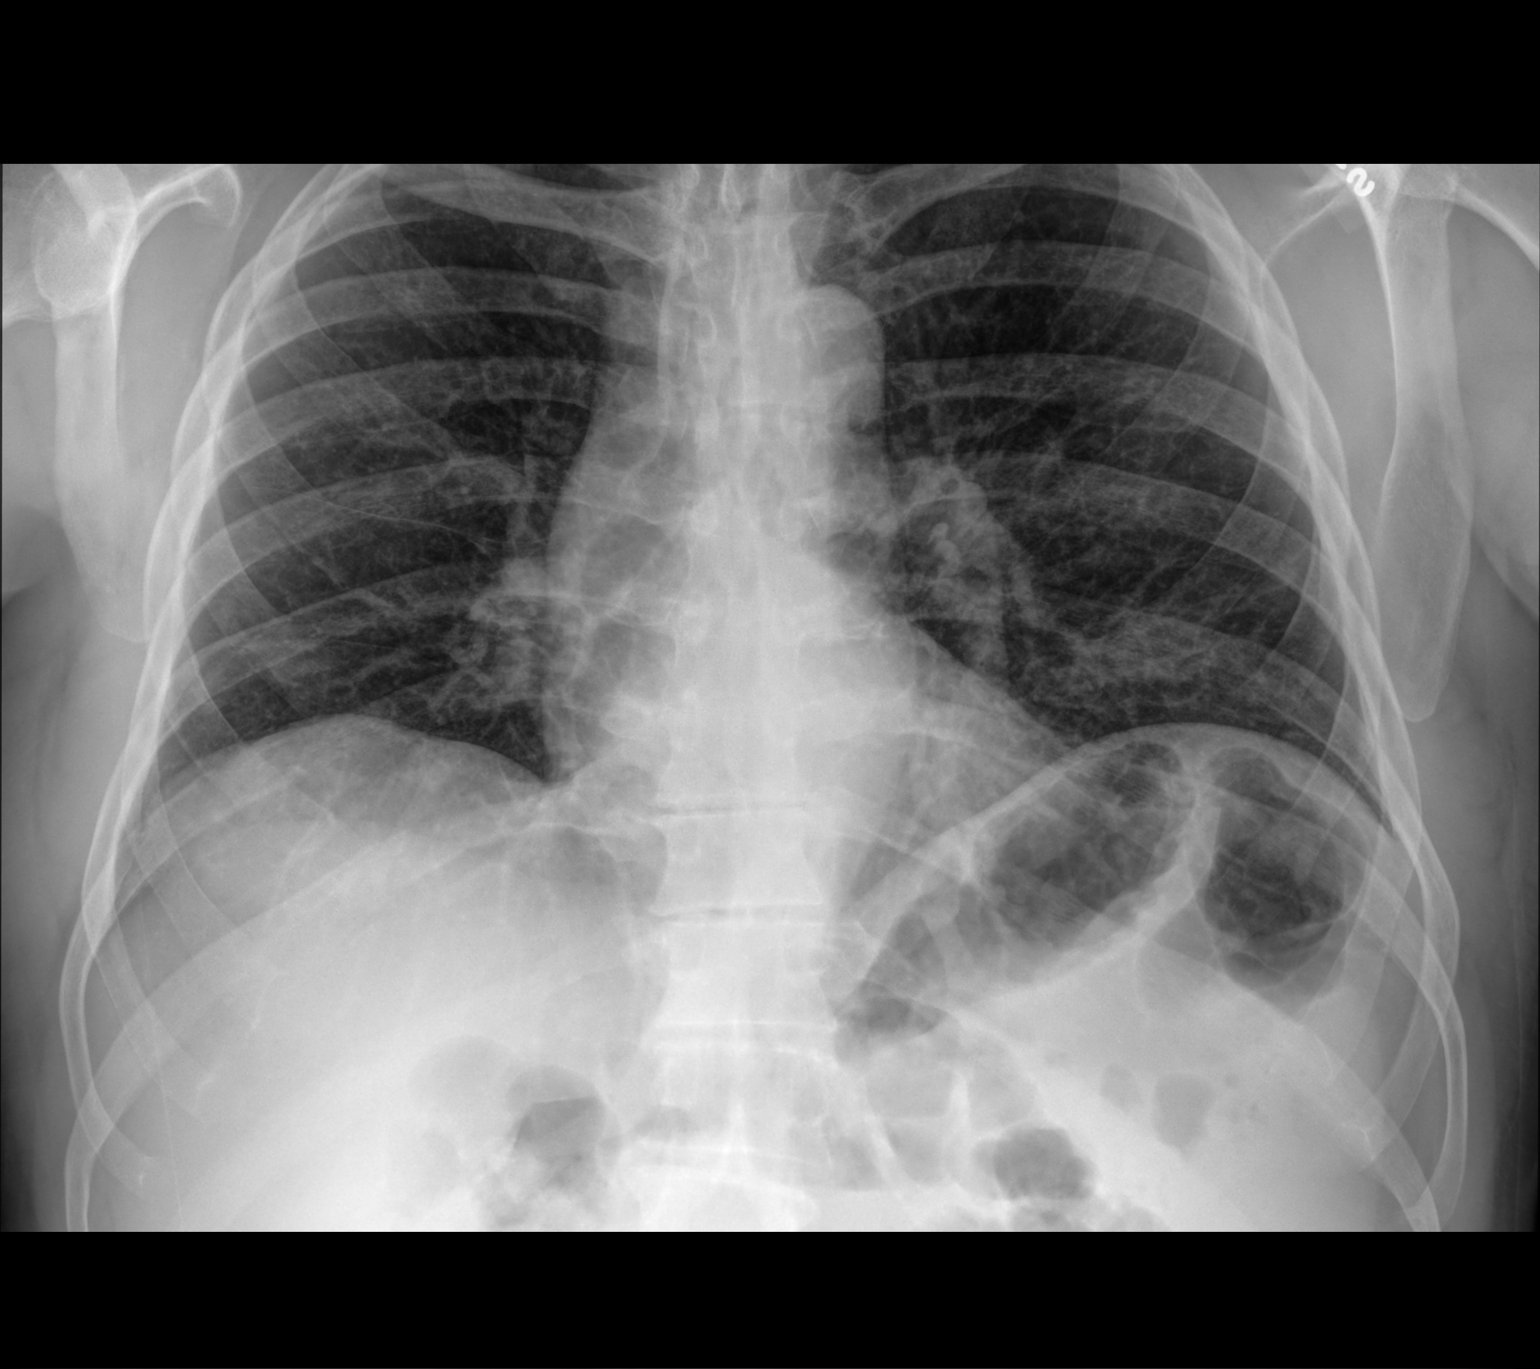

[chest lat]
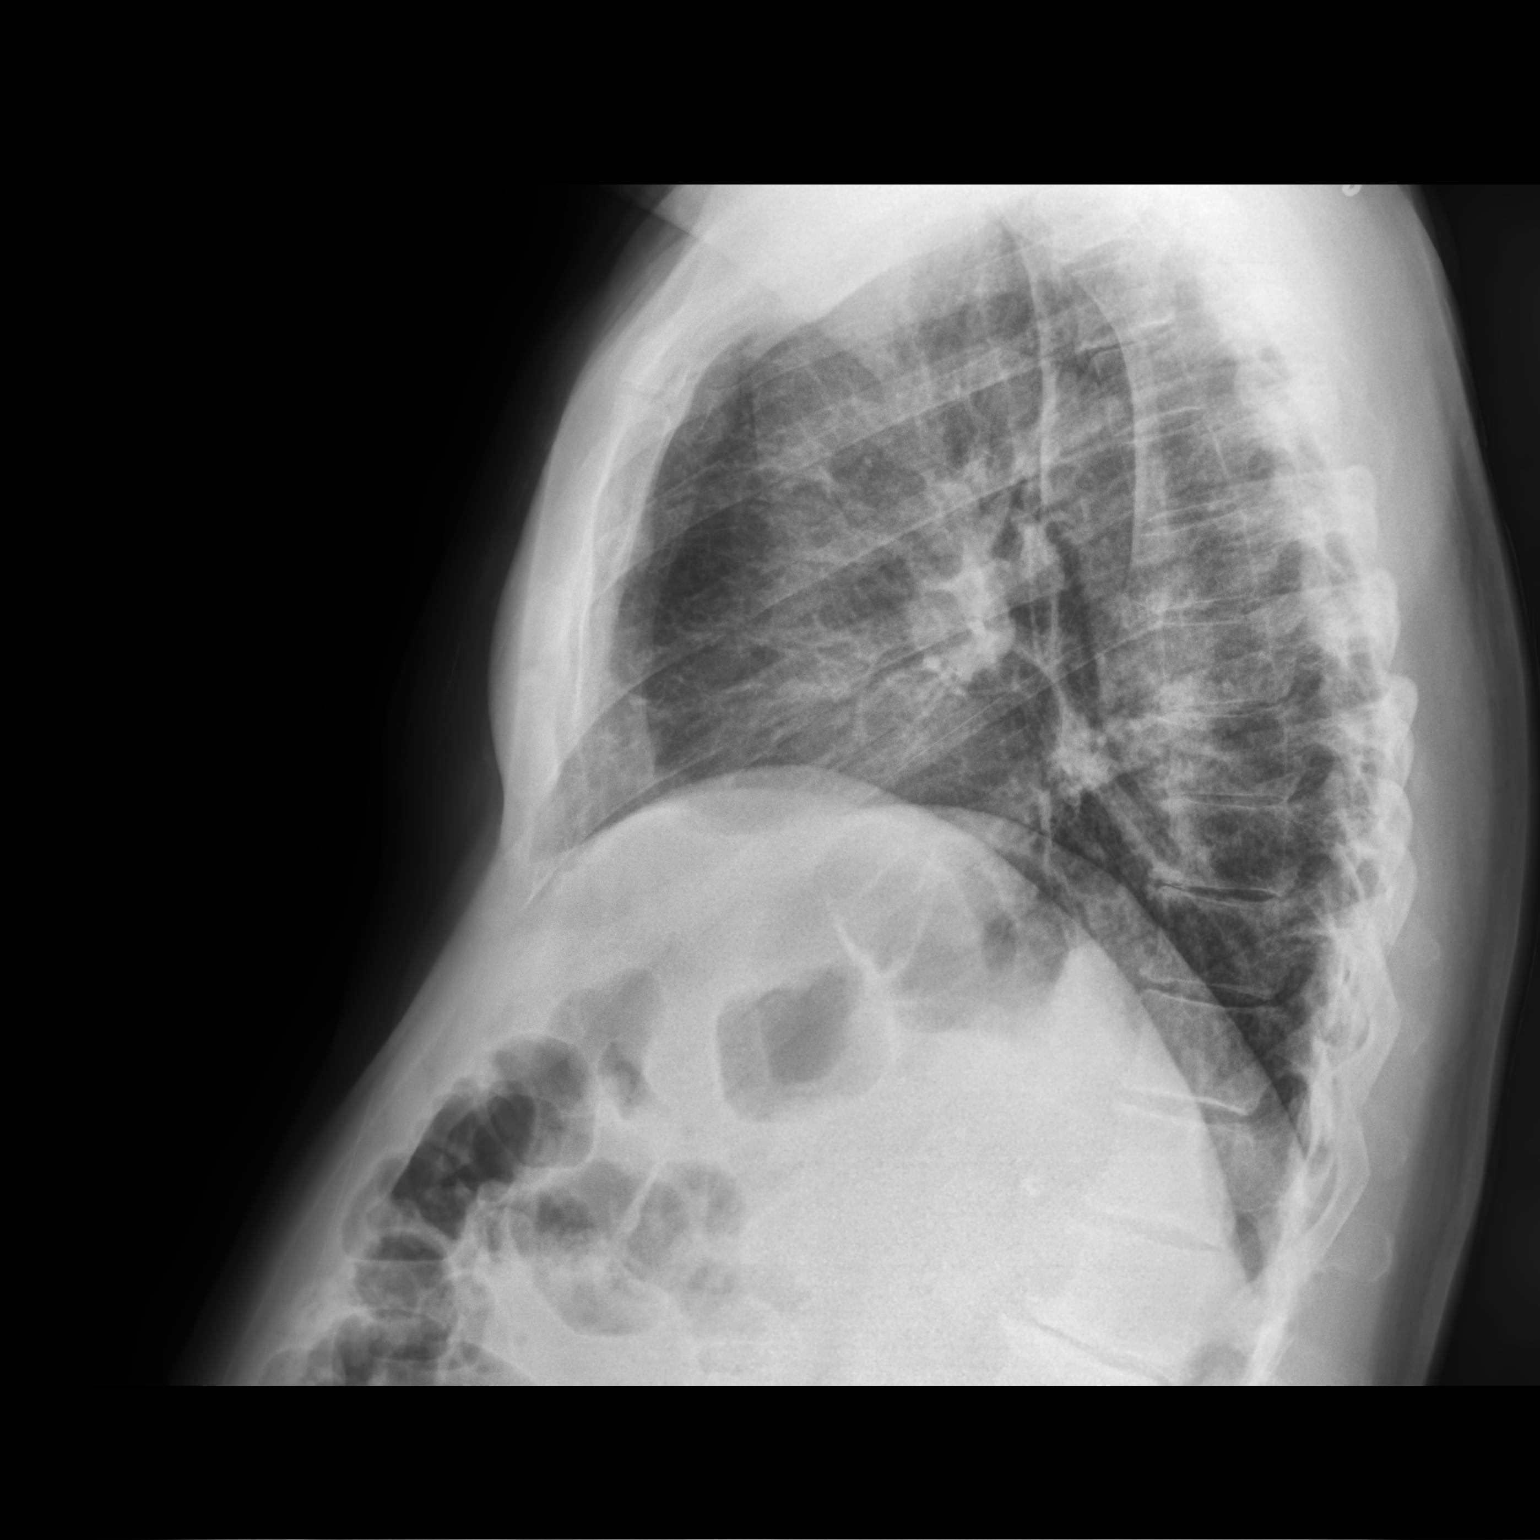

[2 of 2 positions shown; findings below may reference images not displayed]

FINDINGS: Mild opacity in left base. The heart, hila, mediastinum, lungs, and
pleura are otherwise unremarkable.
IMPRESSION: Mild opacity in left base may represent atelectasis or early
pneumonia. Recommend follow-up to resolution.

## 2021-09-25 ENCOUNTER — Other Ambulatory Visit: Payer: Self-pay | Admitting: Family

## 2021-11-18 NOTE — Assessment & Plan Note (Signed)
Due to testosterone replacement therapy with androgen. Previous work-up 2019: MPN panel: Negative for Jak 2, MPL, CALR Erythropoietin 11.3, hemoglobin 19.9  Current treatment:Phlebotomies every 2 weeks started 12/6/2019switched to monthly phlebotomies 3/17/2020switch to every 2 months 05/22/2019, switched to every 48-month starting 11/20/2019  Lab review:Hemoglobin 16.9   Patient works as a Aeronautical engineer 3 months phlebotomies. He will now undergo phlebotomies at [facility] or One blood.  Return to clinic in 6 months for follow-up with labs

## 2021-11-18 NOTE — Progress Notes (Signed)
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 He presents to the clinic today for follow-up.  He had a phlebotomy at [facility] couple of weeks ago.  He gets every 40-month phlebotomies.  He is doing extremely well without any problems or concerns.  ALLERGIES:  has No Known Allergies.  MEDICATIONS:  Current Outpatient Medications  Medication Sig Dispense Refill   aspirin EC 81 MG tablet Take 81 mg by mouth daily.     hydrochlorothiazide (HYDRODIURIL) 25 MG tablet TAKE 1 TABLET BY MOUTH DAILY WITH LISINOPRIL  1   lisinopril (ZESTRIL) 10 MG tablet TAKE 1 TABLET BY MOUTH EVERY DAY 90 tablet 1   Multiple Vitamin (MULTIVITAMIN) tablet Take 4 tablets by mouth daily.     sildenafil (REVATIO) 20 MG tablet Take 1 tablet (20 mg total) by mouth as needed. 10 tablet 0   silodosin (RAPAFLO) 8 MG CAPS capsule Take 1 capsule (8 mg total) by mouth daily with breakfast. 30 capsule    Testosterone Cypionate 100 MG/ML SOLN 10% GEL. 4 clicks daily. 100 mL 3   No current facility-administered medications for this visit.    PHYSICAL EXAMINATION: ECOG PERFORMANCE STATUS: 1 - Symptomatic but completely ambulatory  There were no vitals filed for this visit. There were no vitals filed for this visit.  LABORATORY DATA:  I have reviewed the data as listed CMP Latest Ref Rng & Units 07/09/2021 11/07/2018 10/24/2018  Glucose 65 - 99 mg/dL 80 - 299(M)  BUN 7 - 25 mg/dL 10 - 16  Creatinine 4.26 - 1.35 mg/dL 8.34 - 1.96  Sodium 222 - 146 mmol/L 133(L) - 134(L)  Potassium 3.5 - 5.3 mmol/L 3.7 -  3.5  Chloride 98 - 110 mmol/L 97(L) - 100  CO2 20 - 32 mmol/L 24 - 24  Calcium 8.6 - 10.3 mg/dL 9.4 - 9.2  Total Protein 6.1 - 8.1 g/dL 6.9 - 7.1  Total Bilirubin 0.2 - 1.2 mg/dL 9.7(L) 8.9(Q) 1.1(H)  Alkaline Phos 38 - 126 U/L - - 52  AST 10 - 35 U/L 21 - 26  ALT 9 - 46 U/L 21 - 31    Lab Results  Component Value Date   WBC 6.5 11/19/2021   HGB 15.8 11/19/2021   HCT 49.9 11/19/2021   MCV 71.9 (L) 11/19/2021   PLT 187 11/19/2021   NEUTROABS 3.9 11/19/2021    ASSESSMENT & PLAN:  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 Previous work-up 2019: MPN panel: Negative for Jak 2, MPL, CALR Erythropoietin 11.3, hemoglobin 19.9   Current treatment: Phlebotomies every 2 weeks started 11/11/2018 switched to monthly phlebotomies 02/21/2019 switch to every 2 months 05/22/2019, switched to every 2-month starting 11/20/2019   Lab review: Hemoglobin 15.8    Patient works as a IT sales professional   Is expecting a grandchild and looking forward to his grandson moving him closer to his home.   Plan: Every 3 months phlebotomies.  He will now undergo [facility]botomies at [facility]   Return to clinic in 1 year for follow-up with labs    No orders of  the defined types were placed in this encounter.  The patient has a good understanding of the overall plan. he agrees with it. he will call with any problems that may develop before the next visit here.  Total time spent: 20 mins including face to face time and time spent for planning, charting and coordination of care  Sabas Sous, MD, MPH 11/19/2021  I, Alda Ponder, am acting as scribe for Dr. Serena Croissant.  I have reviewed the above documentation for accuracy and completeness, and I agree with the above.

## 2021-11-19 ENCOUNTER — Other Ambulatory Visit: Payer: Self-pay

## 2021-11-19 ENCOUNTER — Inpatient Hospital Stay: Payer: Medicare HMO | Attending: Hematology and Oncology

## 2021-11-19 ENCOUNTER — Inpatient Hospital Stay: Payer: Medicare HMO | Admitting: Hematology and Oncology

## 2021-11-19 DIAGNOSIS — D751 Secondary polycythemia: Secondary | ICD-10-CM | POA: Diagnosis not present

## 2021-11-19 DIAGNOSIS — Z79899 Other long term (current) drug therapy: Secondary | ICD-10-CM | POA: Diagnosis not present

## 2021-11-19 LAB — CBC WITH DIFFERENTIAL (CANCER CENTER ONLY)
Abs Immature Granulocytes: 0.02 10*3/uL (ref 0.00–0.07)
Basophils Absolute: 0 10*3/uL (ref 0.0–0.1)
Basophils Relative: 1 %
Eosinophils Absolute: 0.1 10*3/uL (ref 0.0–0.5)
Eosinophils Relative: 2 %
HCT: 49.9 % (ref 39.0–52.0)
Hemoglobin: 15.8 g/dL (ref 13.0–17.0)
Immature Granulocytes: 0 %
Lymphocytes Relative: 27 %
Lymphs Abs: 1.7 10*3/uL (ref 0.7–4.0)
MCH: 22.8 pg — ABNORMAL LOW (ref 26.0–34.0)
MCHC: 31.7 g/dL (ref 30.0–36.0)
MCV: 71.9 fL — ABNORMAL LOW (ref 80.0–100.0)
Monocytes Absolute: 0.7 10*3/uL (ref 0.1–1.0)
Monocytes Relative: 10 %
Neutro Abs: 3.9 10*3/uL (ref 1.7–7.7)
Neutrophils Relative %: 60 %
Platelet Count: 187 10*3/uL (ref 150–400)
RBC: 6.94 MIL/uL — ABNORMAL HIGH (ref 4.22–5.81)
RDW: 20.1 % — ABNORMAL HIGH (ref 11.5–15.5)
WBC Count: 6.5 10*3/uL (ref 4.0–10.5)
nRBC: 0 % (ref 0.0–0.2)

## 2021-11-24 IMAGING — CR DG CHEST 2V
1 series · 2 of 2 positions shown · non-contrast
Comparison: PA and lateral chest 12/14/2020.

CLINICAL DATA: History of pneumonia in [REDACTED] of this year.
Congestion and productive cough.

EXAM:
CHEST - 2 VIEW

[Series 1: dg chest 2 view · 0.14mm/px · 2 of 2 slices shown]
[im 1/2]
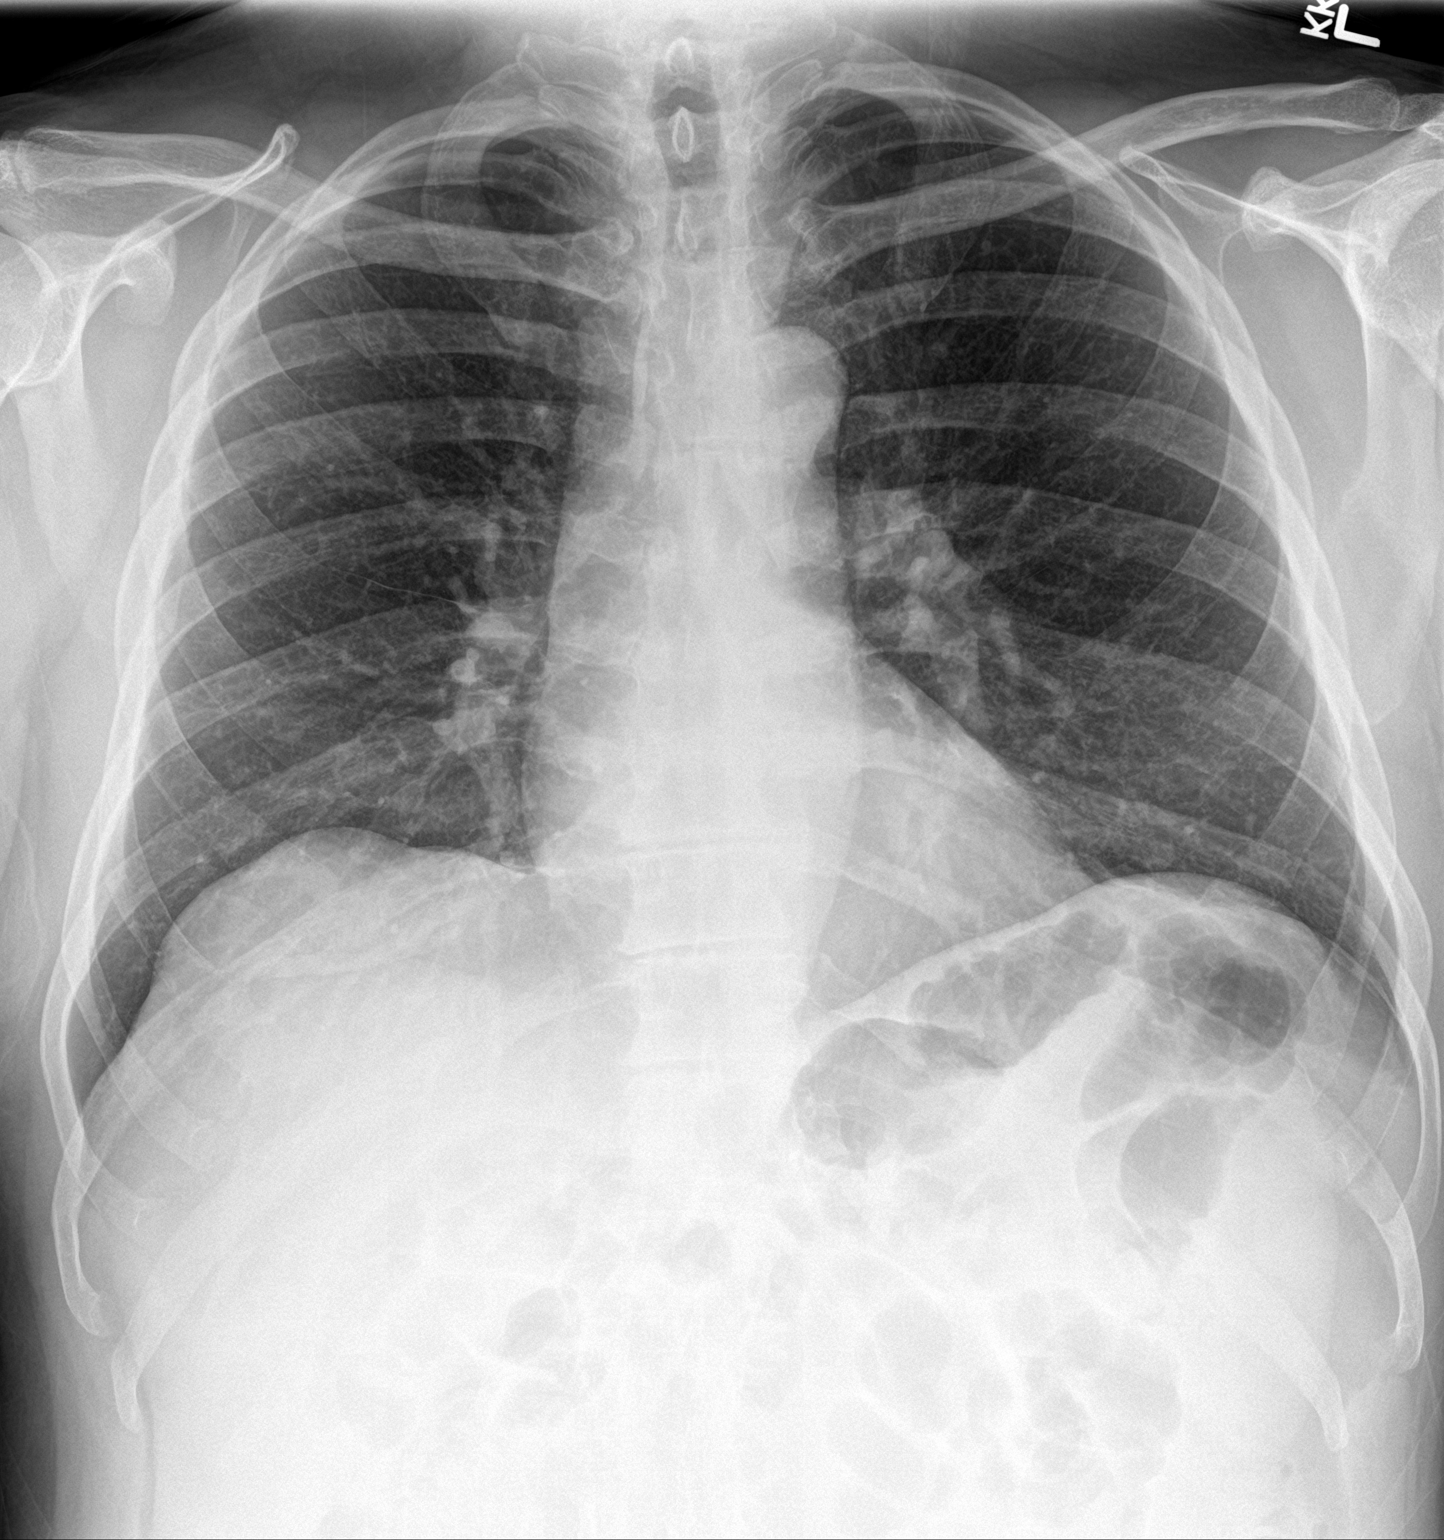
[im 2/2]
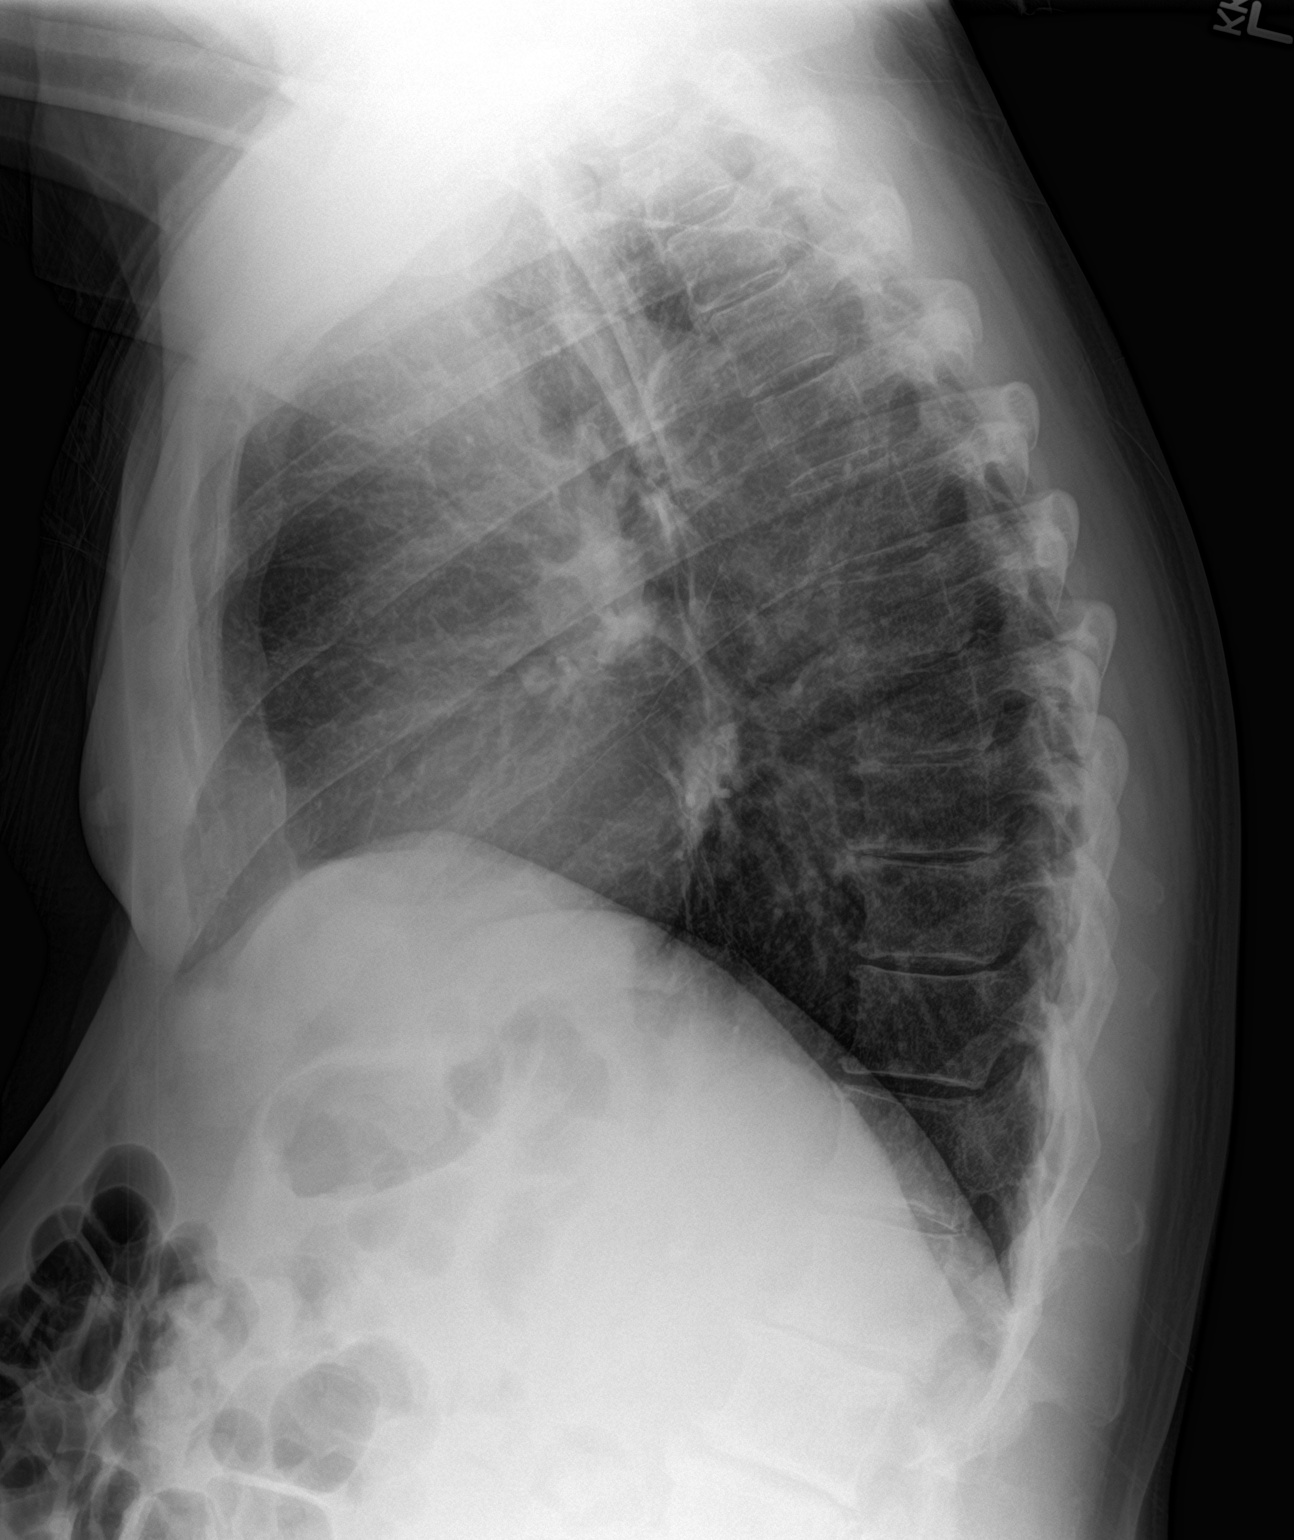

[2 of 2 positions shown; findings below may reference images not displayed]

FINDINGS: Lungs clear. Heart size normal. No pneumothorax or pleural effusion.
No acute or focal bony abnormality.
IMPRESSION: Negative chest.

## 2022-01-09 ENCOUNTER — Ambulatory Visit: Payer: Medicare HMO | Admitting: Family

## 2022-01-19 ENCOUNTER — Other Ambulatory Visit: Payer: Self-pay

## 2022-01-22 ENCOUNTER — Encounter: Payer: Self-pay | Admitting: Family

## 2022-01-23 ENCOUNTER — Other Ambulatory Visit: Payer: Self-pay

## 2022-01-23 ENCOUNTER — Encounter: Payer: Self-pay | Admitting: Family

## 2022-01-23 ENCOUNTER — Ambulatory Visit (INDEPENDENT_AMBULATORY_CARE_PROVIDER_SITE_OTHER): Payer: Medicare HMO | Admitting: Family

## 2022-01-23 VITALS — BP 138/84 | HR 81 | Temp 98.1°F | Resp 16 | Ht 69.0 in | Wt 178.2 lb

## 2022-01-23 DIAGNOSIS — Z6826 Body mass index (BMI) 26.0-26.9, adult: Secondary | ICD-10-CM

## 2022-01-23 DIAGNOSIS — N529 Male erectile dysfunction, unspecified: Secondary | ICD-10-CM | POA: Diagnosis not present

## 2022-01-23 DIAGNOSIS — E663 Overweight: Secondary | ICD-10-CM

## 2022-01-23 DIAGNOSIS — Z1159 Encounter for screening for other viral diseases: Secondary | ICD-10-CM

## 2022-01-23 DIAGNOSIS — H6123 Impacted cerumen, bilateral: Secondary | ICD-10-CM

## 2022-01-23 DIAGNOSIS — E785 Hyperlipidemia, unspecified: Secondary | ICD-10-CM

## 2022-01-23 DIAGNOSIS — I10 Essential (primary) hypertension: Secondary | ICD-10-CM | POA: Diagnosis not present

## 2022-01-23 DIAGNOSIS — Z23 Encounter for immunization: Secondary | ICD-10-CM

## 2022-01-23 MED ORDER — HYDROCHLOROTHIAZIDE 25 MG PO TABS: 25.0000 mg | ORAL_TABLET | Freq: Every day | ORAL | 1 refills | Status: DC

## 2022-01-23 MED ORDER — ZOSTER VAC RECOMB ADJUVANTED 50 MCG/0.5ML IM SUSR: 0.5000 mL | Freq: Once | INTRAMUSCULAR | 0 refills | Status: AC

## 2022-01-23 MED ORDER — TETANUS-DIPHTH-ACELL PERTUSSIS 5-2.5-18.5 LF-MCG/0.5 IM SUSP: 0.5000 mL | Freq: Once | INTRAMUSCULAR | 0 refills | Status: AC

## 2022-01-23 MED ORDER — LISINOPRIL 10 MG PO TABS: 10.0000 mg | ORAL_TABLET | Freq: Every day | ORAL | 1 refills | Status: DC

## 2022-01-23 MED ORDER — TESTOSTERONE CYPIONATE 100 MG/ML IJ SOLN: INTRAMUSCULAR | 3 refills | Status: DC

## 2022-01-23 NOTE — Progress Notes (Signed)
Provider: Marlowe Sax FNP-C   Sergio [name], Nelda Bucks, NP  Patient Care Team: Marabella Popiel, Nelda Bucks, NP as PCP - General (Family Medicine) Nicholas Lose, MD as Consulting Physician (Hematology and Oncology)  Extended Emergency Contact Information Primary Emergency Contact: Vides,[name] Mobile Phone: [phone] Relation: Spouse Secondary Emergency Contact: [place] Mobile Phone: [phone] Relation: Daughter  Code Status:  Full Code  Goals of care: Advanced Directive information Advanced Directives 01/22/2022  Does Patient Have a Medical Advance Directive? No  Type of Advance Directive -  Copy of [place] in Chart? -  Would patient like information on creating a medical advance directive? No - Patient declined     Chief Complaint  Patient presents with   Medical Management of Chronic Issues    6 month follow up.   Health Maintenance    Discuss the need for Hepatitis C Screening.    Immunizations    Discuss the need for Tetanus vaccine, Influenza vaccine, Covid Booster, Shingrix vaccine, and Pne vaccine.    HPI:  Pt is a 71 y.o. male seen today for 6 months follow up medical management of chronic diseases.He denies any acute issues. He follows up with Orthopedic for right knee pain.States was given cortisol injection.Has another follow up with Dr.Murphy. Health Maintenance ; Due for COVID-19 booster vaccine but declines any further boosters. Also due for Tdap,Influenza vaccine ,Shingles and PNA vaccine.     Past Medical History:  Diagnosis Date   Enlarged prostate    Erectile dysfunction    History of colonoscopy    History of COVID-19    Hypertension    Low testosterone    Low testosterone    Past Surgical History:  Procedure Laterality Date   RETINAL DETACHMENT SURGERY Right     No Known Allergies  Allergies as of 01/23/2022   No Known Allergies      Medication List        Accurate as of January 23, 2022  8:36 AM. If you  have any questions, ask your nurse or doctor.          STOP taking these medications    tetanus & diphtheria toxoids (adult) 5-2 LFU injection Commonly known as: TENIVAC Stopped by: Sandrea Hughs, NP       TAKE these medications    aspirin EC 81 MG tablet Take 81 mg by mouth daily.   hydrochlorothiazide 25 MG tablet Commonly known as: HYDRODIURIL TAKE 1 TABLET BY MOUTH DAILY WITH LISINOPRIL   lisinopril 10 MG tablet Commonly known as: ZESTRIL TAKE 1 TABLET BY MOUTH EVERY DAY   multivitamin tablet Take 4 tablets by mouth daily.   sildenafil 20 MG tablet Commonly known as: REVATIO Take 1 tablet (20 mg total) by mouth as needed.   silodosin 8 MG Caps capsule Commonly known as: RAPAFLO Take 1 capsule (8 mg total) by mouth daily with breakfast.   Tdap 5-2.5-18.5 LF-MCG/0.5 injection Commonly known as: BOOSTRIX Inject 0.5 mLs into the muscle once.   Testosterone Cypionate 100 MG/ML Soln 10% GEL. 4 clicks daily.   Zoster Vaccine Adjuvanted injection Commonly known as: SHINGRIX Inject 0.5 mLs into the muscle once.        Review of Systems  Constitutional:  Negative for appetite change, chills, fatigue, fever and unexpected weight change.  HENT:  Negative for congestion, dental problem, ear discharge, ear pain, facial swelling, hearing loss, nosebleeds, postnasal drip, rhinorrhea, sinus pressure, sinus pain, sneezing, sore throat, tinnitus and trouble swallowing.   Eyes:  Negative for pain, discharge, redness, itching and visual disturbance.  Respiratory:  Negative for cough, chest tightness, shortness of breath and wheezing.   Cardiovascular:  Negative for chest pain, palpitations and leg swelling.  Gastrointestinal:  Negative for abdominal distention, abdominal pain, blood in stool, constipation, diarrhea, nausea and vomiting.  Endocrine: Negative for cold intolerance, heat intolerance, polydipsia, polyphagia and polyuria.  Genitourinary:  Negative for  difficulty urinating, dysuria, flank pain, frequency and urgency.  Musculoskeletal:  Positive for arthralgias. Negative for back pain, gait problem, joint swelling, myalgias, neck pain and neck stiffness.       Right knee pain   Skin:  Negative for color change, pallor, rash and wound.  Neurological:  Negative for dizziness, syncope, speech difficulty, weakness, light-headedness, numbness and headaches.  Hematological:  Does not bruise/bleed easily.  Psychiatric/Behavioral:  Negative for agitation, behavioral problems, confusion, hallucinations, self-injury, sleep disturbance and suicidal ideas. The patient is not nervous/anxious.    Immunization History  Administered Date(s) Administered   Fluad Quad(high Dose 65+) 01/23/2022   Moderna Sars-Covid-2 Vaccination 12/11/2019, 01/08/2020   Pertinent  Health Maintenance Due  Topic Date Due   COLONOSCOPY (Pts 45-35yr Insurance coverage will need to be confirmed)  10/18/2030   INFLUENZA VACCINE  Completed   Fall Risk 12/14/2020 04/11/2021 05/21/2021 07/08/2021 01/22/2022  Falls in the past year? - - - 0 1  Was there an injury with Fall? - - - 0 0  Fall Risk Category Calculator - - - 0 1  Fall Risk Category - - - Low Low  Patient Fall Risk Level _0   Patient at Risk for Falls Due to - - - No Fall Risks History of fall(s)  Fall risk Follow up - - - Falls evaluation completed Falls evaluation completed;Education provided;Falls prevention discussed   Functional Status Survey:    Vitals:   01/23/22 0809  BP: 138/84  Pulse: 81  Resp: 16  Temp: 98.1 F (36.7 C)  SpO2: 97%  Weight: 178 lb 3.2 oz (80.8 kg)  Height: _1  (1.753 m)   Body mass index is 26.32 kg/m. Physical Exam Vitals reviewed.  Constitutional:      General: He is not in acute distress.    Appearance: Normal appearance. He is normal weight. He is not ill-appearing or diaphoretic.  HENT:     Head:  Normocephalic.     Right Ear: There is impacted cerumen.     Left Ear: There is impacted cerumen.     Ears:     Comments: Bilateral ear cerumen lavaged with warm water and hydrogen peroxide moderate amounts of cerumen obtained.Tolerated procedure well.TM clear without any signs of infection.No instrument used      Nose: Nose normal. No congestion or rhinorrhea.     Mouth/Throat:     Mouth: Mucous membranes are moist.     Pharynx: Oropharynx is clear. No oropharyngeal exudate or posterior oropharyngeal erythema.  Eyes:     General: No scleral icterus.       Right eye: No discharge.        Left eye: No discharge.     Extraocular Movements: Extraocular movements intact.     Conjunctiva/sclera: Conjunctivae normal.     Pupils: Pupils are equal, round, and reactive to light.  Neck:     Vascular: No carotid bruit.  Cardiovascular:     Rate and Rhythm: Normal rate and regular rhythm.     Pulses:  Normal pulses.     Heart sounds: Normal heart sounds. No murmur heard.   No friction rub. No gallop.  Pulmonary:     Effort: Pulmonary effort is normal. No respiratory distress.     Breath sounds: Normal breath sounds. No wheezing, rhonchi or rales.  Chest:     Chest wall: No tenderness.  Abdominal:     General: Bowel sounds are normal. There is no distension.     Palpations: Abdomen is soft. There is no mass.     Tenderness: There is no abdominal tenderness. There is no right CVA tenderness, left CVA tenderness, guarding or rebound.  Musculoskeletal:        General: No swelling or tenderness. Normal range of motion.     Cervical back: Normal range of motion. No rigidity or tenderness.     Right knee: Crepitus present. No swelling, effusion or erythema. Normal range of motion. No tenderness.     Left knee: Normal.     Right lower leg: No edema.     Left lower leg: No edema.  Lymphadenopathy:     Cervical: No cervical adenopathy.  Skin:    General: Skin is warm and dry.     Coloration:  Skin is not pale.     Findings: No bruising, erythema, lesion or rash.  Neurological:     Mental Status: He is alert and oriented to person, place, and time.     Cranial Nerves: No cranial nerve deficit.     Sensory: No sensory deficit.     Motor: No weakness.     Coordination: Coordination normal.     Gait: Gait normal.  Psychiatric:        Mood and Affect: Mood normal.        Speech: Speech normal.        Behavior: Behavior normal.        Thought Content: Thought content normal.        Judgment: Judgment normal.    Labs reviewed: Recent Labs    07/09/21 1006  NA 133*  K 3.7  CL 97*  CO2 24  GLUCOSE 80  BUN 10  CREATININE 0.89  CALCIUM 9.4   Recent Labs    07/09/21 1006  AST 21  ALT 21  BILITOT 1.8*  PROT 6.9   Recent Labs    05/21/21 0755 07/09/21 1006 11/19/21 0802  WBC 5.9 6.1 6.5  NEUTROABS 3.3 3,587 3.9  HGB 16.9 17.7* 15.8  HCT 50.5 55.1* 49.9  MCV 73.9* 76.3* 71.9*  PLT 162 185 187   Lab Results  Component Value Date   TSH 1.07 07/09/2021   No results found for: HGBA1C Lab Results  Component Value Date   CHOL 171 07/09/2021   HDL 47 07/09/2021   LDLCALC 103 (H) 07/09/2021   TRIG 117 07/09/2021   CHOLHDL 3.6 07/09/2021    Significant Diagnostic Results in last 30 days:  No results found.  Assessment/Plan 1. Need for hepatitis C screening test Low risk  - Hep C Antibody  2. Need for Tdap vaccination Advised to get Tdap vaccine at the pharmacy. Script send to pharmacy today  - Tdap ([place]) 5-2.5-18.5 LF-MCG/0.5 injection; Inject 0.5 mLs into the muscle once for 1 dose.  Dispense: 0.5 mL; Refill: 0  3. Need for shingles vaccine Advised to get shingrix vaccine at the pharmacy.Script send to pharmacy today  - Zoster Vaccine Adjuvanted [facility]) injection; Inject 0.5 mLs into the muscle once for 1 dose.  Dispense:  0.5 mL; Refill: 0  4. Need for influenza vaccination Flut shot administered by CMA no acute reaction reported.   -  Flu Vaccine QUAD High Dose(Fluad)  5. Essential hypertension B/p well controlled  - continue on lisinopril and Hydrochlorothiazide  - lisinopril (ZESTRIL) 10 MG tablet; Take 1 tablet (10 mg total) by mouth daily.  Dispense: 90 tablet; Refill: 1 - hydrochlorothiazide (HYDRODIURIL) 25 MG tablet; Take 1 tablet (25 mg total) by mouth daily.  Dispense: 90 tablet; Refill: 1 - Lipid Panel - TSH - CBC with Differential/Platelet - CMP with eGFR([facility]) - CBC with Differential/Platelet; Future - CMP with eGFR([facility]); Future - TSH; Future  6. Erectile dysfunction, unspecified erectile dysfunction type Continue on Testosterone  - Testosterone Cypionate 100 MG/ML SOLN; 10% GEL. 4 clicks daily.  Dispense: 100 mL; Refill: 3  7. Bilateral impacted cerumen Bilateral ear cerumen lavaged with warm water and hydrogen peroxide moderate amounts of cerumen obtained.Tolerated procedure well.TM clear without any signs of infection.No instrument used   8. Hyperlipidemia LDL goal <100 LDL not at goal  - Lipid panel; Future  9. Overweight with body mass index (BMI) 25.0-29.9 - Dietary modification and exercise at least 3 times per week for 30 minutes advised.   10. Body mass index 26.0-26.9, adult BMI 26.32  Dietary modification and exercise as above    Family/ staff Communication: Reviewed plan of care with patient verbalized understanding   Labs/tests ordered:  - CBC with Differential/Platelet - CMP with eGFR([facility]) - TSH - Hgb A1C - Lipid panel - Hep C Antibody  Next Appointment : 6 months for medical management of chronic issues.Fasting Labs prior to visit.    Sandrea Hughs, NP

## 2022-01-26 LAB — CBC WITH DIFFERENTIAL/PLATELET
Absolute Monocytes: 590 cells/uL (ref 200–950)
Basophils Absolute: 47 cells/uL (ref 0–200)
Basophils Relative: 0.7 %
Eosinophils Absolute: 87 cells/uL (ref 15–500)
Eosinophils Relative: 1.3 %
HCT: 51.7 % — ABNORMAL HIGH (ref 38.5–50.0)
Hemoglobin: 15.9 g/dL (ref 13.2–17.1)
Lymphs Abs: 2238 cells/uL (ref 850–3900)
MCH: 22.6 pg — ABNORMAL LOW (ref 27.0–33.0)
MCHC: 30.8 g/dL — ABNORMAL LOW (ref 32.0–36.0)
MCV: 73.4 fL — ABNORMAL LOW (ref 80.0–100.0)
MPV: 10.1 fL (ref 7.5–12.5)
Monocytes Relative: 8.8 %
Neutro Abs: 3739 cells/uL (ref 1500–7800)
Neutrophils Relative %: 55.8 %
Platelets: 203 10*3/uL (ref 140–400)
RBC: 7.04 10*6/uL — ABNORMAL HIGH (ref 4.20–5.80)
RDW: 19.1 % — ABNORMAL HIGH (ref 11.0–15.0)
Total Lymphocyte: 33.4 %
WBC: 6.7 10*3/uL (ref 3.8–10.8)

## 2022-01-26 LAB — COMPLETE METABOLIC PANEL WITH GFR
AG Ratio: 1.7 (calc) (ref 1.0–2.5)
ALT: 26 U/L (ref 9–46)
AST: 17 U/L (ref 10–35)
Albumin: 4.3 g/dL (ref 3.6–5.1)
Alkaline phosphatase (APISO): 40 U/L (ref 35–144)
BUN: 12 mg/dL (ref 7–25)
CO2: 26 mmol/L (ref 20–32)
Calcium: 9.4 mg/dL (ref 8.6–10.3)
Chloride: 98 mmol/L (ref 98–110)
Creat: 1 mg/dL (ref 0.70–1.28)
Globulin: 2.6 g/dL (calc) (ref 1.9–3.7)
Glucose, Bld: 91 mg/dL (ref 65–99)
Potassium: 3.7 mmol/L (ref 3.5–5.3)
Sodium: 133 mmol/L — ABNORMAL LOW (ref 135–146)
Total Bilirubin: 1.7 mg/dL — ABNORMAL HIGH (ref 0.2–1.2)
Total Protein: 6.9 g/dL (ref 6.1–8.1)
eGFR: 81 mL/min/{1.73_m2} (ref >=60)

## 2022-01-26 LAB — HEPATITIS C ANTIBODY
Hepatitis C Ab: NONREACTIVE
SIGNAL TO CUT-OFF: <0.02 (ref <1.00)

## 2022-01-26 LAB — LIPID PANEL
Cholesterol: 161 mg/dL (ref <200)
HDL: 42 mg/dL (ref >=40)
LDL Cholesterol (Calc): 92 mg/dL (calc)
Non-HDL Cholesterol (Calc): 119 mg/dL (calc) (ref <130)
Total CHOL/HDL Ratio: 3.8 (calc) (ref <5.0)
Triglycerides: 175 mg/dL — ABNORMAL HIGH (ref <150)

## 2022-01-26 LAB — TSH: TSH: 1.77 mIU/L (ref 0.40–4.50)

## 2022-05-21 ENCOUNTER — Telehealth: Payer: Medicare HMO | Admitting: Nurse Practitioner

## 2022-05-22 ENCOUNTER — Encounter: Payer: Self-pay | Admitting: Nurse Practitioner

## 2022-05-22 ENCOUNTER — Telehealth (INDEPENDENT_AMBULATORY_CARE_PROVIDER_SITE_OTHER): Payer: Medicare HMO | Admitting: Nurse Practitioner

## 2022-05-22 DIAGNOSIS — I10 Essential (primary) hypertension: Secondary | ICD-10-CM

## 2022-05-22 MED ORDER — LISINOPRIL 20 MG PO TABS: 20.0000 mg | ORAL_TABLET | Freq: Every day | ORAL | 1 refills | Status: DC

## 2022-05-22 NOTE — Progress Notes (Signed)
Careteam: Patient Care Team: Ngetich, Donalee Citrin, NP as PCP - General (Family Medicine) Serena Croissant, MD as Consulting Physician (Hematology and Oncology)  Advanced Directive information Does Patient Have a Medical Advance Directive?: Yes, Would patient like information on creating a medical advance directive?: No - Patient declined, Type of Advance Directive: Healthcare Power of [place];Living will, Does patient want to make changes to medical advance directive?: No - Patient declined  No Known Allergies  Chief Complaint  Patient presents with   Acute Visit    Patient complains of high blood pressure readings around at work 170/94,188/88 then at home resting checked and went down to 134/92,137/94.     HPI: Patient is a 71 y.o. male for elevated blood pressure.   Blood pressure this morning was 134/92 Yesterday afternoon after a nap blood pressure 134/84 HR 74  He takes medication first thing in the morning.  Reports diastolic tends to be elevated in high 80s and into 90s.   He continues to work EMS. He was not feeling well. Face was red. 188/88 blood pressure. He had been moving an obese pt and over did it.   No chest pains, shortness breath, dizziness, light headedness, no headache at this time. Slight headache during event.    Review of Systems:  Review of Systems  Constitutional:  Negative for chills, fever and weight loss.  HENT:  Negative for tinnitus.   Respiratory:  Negative for cough, sputum production and shortness of breath.   Cardiovascular:  Negative for chest pain, palpitations and leg swelling.  Gastrointestinal:  Negative for abdominal pain, constipation, diarrhea and heartburn.  Genitourinary:  Negative for dysuria, frequency and urgency.  Skin: Negative.   Neurological:  Negative for dizziness and headaches.    Past Medical History:  Diagnosis Date   Enlarged prostate    Erectile dysfunction    History of colonoscopy    History of COVID-19     Hypertension    Low testosterone    Low testosterone    Past Surgical History:  Procedure Laterality Date   RETINAL DETACHMENT SURGERY Right    Social History:   reports that he has never smoked. He has never used smokeless tobacco. He reports that he does not drink alcohol and does not use drugs.  Family History  Problem Relation Age of Onset   Heart failure Mother    Heart failure Father    Arthritis Sister    Arthritis Sister    Arthritis Son     Medications: Patient's Medications  New Prescriptions   No medications on file  Previous Medications   ASPIRIN EC 81 MG TABLET    Take 81 mg by mouth daily.   HYDROCHLOROTHIAZIDE (HYDRODIURIL) 25 MG TABLET    Take 1 tablet (25 mg total) by mouth daily.   LISINOPRIL (ZESTRIL) 10 MG TABLET    Take 1 tablet (10 mg total) by mouth daily.   MULTIPLE VITAMIN (MULTIVITAMIN) TABLET    Take 4 tablets by mouth daily.   SILDENAFIL (REVATIO) 20 MG TABLET    Take 1 tablet (20 mg total) by mouth as needed.   SILODOSIN (RAPAFLO) 8 MG CAPS CAPSULE    Take 1 capsule (8 mg total) by mouth daily with breakfast.   TESTOSTERONE CYPIONATE 100 MG/ML SOLN    10% GEL. 4 clicks daily.  Modified Medications   No medications on file  Discontinued Medications   No medications on file    Physical Exam:  There were no vitals  filed for this visit. There is no height or weight on file to calculate BMI. Wt Readings from Last 3 Encounters:  01/23/22 178 lb 3.2 oz (80.8 kg)  11/19/21 178 lb 8 oz (81 kg)  07/09/21 176 lb 3.2 oz (79.9 kg)      Labs reviewed: Basic Metabolic Panel: Recent Labs    07/09/21 1006 01/23/22 0913  NA 133* 133*  K 3.7 3.7  CL 97* 98  CO2 24 26  GLUCOSE 80 91  BUN 10 12  CREATININE 0.89 1.00  CALCIUM 9.4 9.4  TSH 1.07 1.77   Liver Function Tests: Recent Labs    07/09/21 1006 01/23/22 0913  AST 21 17  ALT 21 26  BILITOT 1.8* 1.7*  PROT 6.9 6.9   No results for input(s): "LIPASE", "AMYLASE" in the last 8760  hours. No results for input(s): "AMMONIA" in the last 8760 hours. CBC: Recent Labs    07/09/21 1006 11/19/21 0802 01/23/22 0913  WBC 6.1 6.5 6.7  NEUTROABS 3,587 3.9 3,739  HGB 17.7* 15.8 15.9  HCT 55.1* 49.9 51.7*  MCV 76.3* 71.9* 73.4*  PLT 185 187 203   Lipid Panel: Recent Labs    07/09/21 1006 01/23/22 0913  CHOL 171 161  HDL 47 42  LDLCALC 103* 92  TRIG 117 175*  CHOLHDL 3.6 3.8   TSH: Recent Labs    07/09/21 1006 01/23/22 0913  TSH 1.07 1.77   A1C: No results found for: "HGBA1C"   Assessment/Plan 1. Essential hypertension -continue hctz but to increase lisinopril to 20 mg daily -low sodium diet reinforced - lisinopril (ZESTRIL) 20 MG tablet; Take 1 tablet (20 mg total) by mouth daily.  Dispense: 90 tablet; Refill: 1 -goal bp <140/90- will follow up blood pressure at home.   Next appt: 07/28/2022 Janene Harvey. Biagio Borg  [facility] [phone]    Virtual Visit via telephone- unable to do video  I connected with patient on 05/22/22 at 10:20 AM EDT by telephone and verified that I am speaking with the correct person using two identifiers.  Location: Patient: home Provider: [facility]   I discussed the limitations, risks, security and privacy concerns of performing an evaluation and management service by telephone and the availability of in person appointments. I also discussed with the patient that there may be a patient responsible charge related to this service. The patient expressed understanding and agreed to proceed.   I discussed the assessment and treatment plan with the patient. The patient was provided an opportunity to ask questions and all were answered. The patient agreed with the plan and demonstrated an understanding of the instructions.   The patient was advised to call back or seek an in-person evaluation if the symptoms worsen or if the condition fails to improve as anticipated.  I provided 13 minutes of  non-face-to-face time during this encounter.  Janene Harvey. Biagio Borg Avs printed and mailed

## 2022-05-22 NOTE — Progress Notes (Signed)
  This service is provided via telemedicine  No vital signs collected/recorded due to the encounter was a telemedicine visit.   Location of patient (ex: home, work):  Home  Patient consents to a telephone visit:  Yes  Location of the provider (ex: office, home):  [employer]  Name of any referring provider:  Ngetich, Donalee Citrin, NP   Names of all persons participating in the telemedicine service and their role in the encounter:  Patient, Meda Klinefelter, RMA, Abbey Chatters, NP.    Time spent on call: 8 minutes spent on the phone with Medical Assistant.

## 2022-07-26 ENCOUNTER — Other Ambulatory Visit: Payer: Self-pay | Admitting: Family

## 2022-07-26 DIAGNOSIS — I10 Essential (primary) hypertension: Secondary | ICD-10-CM

## 2022-07-28 ENCOUNTER — Other Ambulatory Visit: Payer: Medicare HMO

## 2022-07-31 ENCOUNTER — Encounter: Payer: Self-pay | Admitting: Family

## 2022-07-31 ENCOUNTER — Ambulatory Visit (INDEPENDENT_AMBULATORY_CARE_PROVIDER_SITE_OTHER): Payer: Medicare HMO | Admitting: Family

## 2022-07-31 VITALS — BP 140/80 | HR 75 | Temp 97.8°F | Resp 16 | Ht 69.0 in | Wt 176.6 lb

## 2022-07-31 DIAGNOSIS — N400 Enlarged prostate without lower urinary tract symptoms: Secondary | ICD-10-CM | POA: Diagnosis not present

## 2022-07-31 DIAGNOSIS — N529 Male erectile dysfunction, unspecified: Secondary | ICD-10-CM | POA: Diagnosis not present

## 2022-07-31 DIAGNOSIS — I10 Essential (primary) hypertension: Secondary | ICD-10-CM | POA: Diagnosis not present

## 2022-07-31 DIAGNOSIS — E785 Hyperlipidemia, unspecified: Secondary | ICD-10-CM

## 2022-07-31 MED ORDER — TESTOSTERONE CYPIONATE 100 MG/ML IJ SOLN: INTRAMUSCULAR | 3 refills | Status: DC

## 2022-07-31 NOTE — Patient Instructions (Addendum)
Please get Tetanus ,COVID-19 booster and shingles vaccine at your pharmacy  

## 2022-07-31 NOTE — Progress Notes (Signed)
 Provider: Dinah Ngetich FNP-C   Ngetich, Dinah C, NP  Patient Care Team: Ngetich, Dinah C, NP as PCP - General (Family Medicine) Gudena, Vinay, MD as Consulting Physician (Hematology and Oncology)  Extended Emergency Contact Information Primary Emergency Contact: Scarbrough,[name] Mobile Phone: [phone] Relation: Spouse Secondary Emergency Contact: [name],[name] Mobile Phone: [phone] Relation: Daughter  Code Status:  Full Code  Goals of care: Advanced Directive information    07/31/2022    8:49 AM  Advanced Directives  Does Patient Have a Medical Advance Directive? No  Would patient like information on creating a medical advance directive? No - Patient declined     Chief Complaint  Patient presents with   Medical Management of Chronic Issues    6 month follow up.   Immunizations    Discuss the need for Tetanus vaccine, Shingrix vaccine, Pne vaccine, Covid Booster, and Influenza vaccine.    Labs     Fasting Labs.    HPI:  Pt is a 71 y.o. male seen today for 6 follow up for medical management of chronic diseases. He denies any acute issues today.  Hypertension - Home blood pressure readings in the 120's/80 both at home and work.Readings elevated on arrival but rechecked improved.He had a video visit with Jessica Eubanks, NP for high blood pressure lisinopril increased from 10 mg to 20 mg tablet daily. denies any headache,dizziness,vision changes,fatigue,chest tightness,palpitation,chest pain or shortness of breath.    BPH - Following up with [facility].monitoring previous high PSA.states symptoms have improved on silodosin.   Hyperlipidemia -  Previous Chol and LDL were normal but TRG were high.Fasting today for lab work.      Due for Tdap,Shingles,COVID-19  and PNA vaccine.Aware to get vaccine at the pharmacy.Tdap script send to pharmacy on previous visit but states forgot it.    Past Medical History:  Diagnosis Date   Enlarged prostate     Erectile dysfunction    History of colonoscopy    History of COVID-19    Hypertension    Low testosterone    Low testosterone    Past Surgical History:  Procedure Laterality Date   RETINAL DETACHMENT SURGERY Right     No Known Allergies  Allergies as of 07/31/2022   No Known Allergies      Medication List        Accurate as of July 31, 2022  8:52 AM. If you have any questions, ask your nurse or doctor.          aspirin EC 81 MG tablet Take 81 mg by mouth daily.   hydrochlorothiazide 25 MG tablet Commonly known as: HYDRODIURIL TAKE 1 TABLET (25 MG TOTAL) BY MOUTH DAILY.   lisinopril 20 MG tablet Commonly known as: ZESTRIL Take 1 tablet (20 mg total) by mouth daily.   multivitamin tablet Take 4 tablets by mouth daily.   sildenafil 20 MG tablet Commonly known as: REVATIO Take 1 tablet (20 mg total) by mouth as needed.   silodosin 8 MG Caps capsule Commonly known as: RAPAFLO Take 1 capsule (8 mg total) by mouth daily with breakfast.   Testosterone Cypionate 100 MG/ML Soln 10% GEL. 4 clicks daily.        Review of Systems  Constitutional:  Negative for appetite change, chills, fatigue, fever and unexpected weight change.  HENT:  Negative for congestion, dental problem, ear discharge, ear pain, facial swelling, hearing loss, nosebleeds, postnasal drip, rhinorrhea, sinus pressure, sinus pain, sneezing, sore throat, tinnitus and trouble swallowing.   Eyes:    Negative for pain, discharge, redness, itching and visual disturbance.  Respiratory:  Negative for cough, chest tightness, shortness of breath and wheezing.   Cardiovascular:  Negative for chest pain, palpitations and leg swelling.  Gastrointestinal:  Negative for abdominal distention, abdominal pain, blood in stool, constipation, diarrhea, nausea and vomiting.  Endocrine: Negative for cold intolerance, heat intolerance, polydipsia, polyphagia and polyuria.  Genitourinary:  Negative for difficulty  urinating, dysuria, flank pain, frequency and urgency.  Musculoskeletal:  Negative for arthralgias, back pain, gait problem, joint swelling, myalgias, neck pain and neck stiffness.  Skin:  Negative for color change, pallor, rash and wound.  Neurological:  Negative for dizziness, syncope, speech difficulty, weakness, light-headedness, numbness and headaches.  Hematological:  Does not bruise/bleed easily.  Psychiatric/Behavioral:  Negative for agitation, behavioral problems, confusion, hallucinations, self-injury, sleep disturbance and suicidal ideas. The patient is not nervous/anxious.     Immunization History  Administered Date(s) Administered   Fluad Quad(high Dose 65+) 01/23/2022   Moderna Sars-Covid-2 Vaccination 12/11/2019, 01/08/2020   Pertinent  Health Maintenance Due  Topic Date Due   INFLUENZA VACCINE  07/07/2022   COLONOSCOPY (Pts 45-19yr Insurance coverage will need to be confirmed)  10/18/2030      05/21/2021    8:19 AM 07/08/2021    3:50 PM 01/22/2022    4:52 PM 05/22/2022   10:22 AM 07/31/2022    8:49 AM  Fall Risk  Falls in the past year?  0 1 0 0  Was there an injury with Fall?  0 0 0 0  Fall Risk Category Calculator  0 1 0 0  Fall Risk Category  Low Low Low Low  Patient Fall Risk Level _0   Patient at Risk for Falls Due to  No Fall Risks History of fall(s) No Fall Risks No Fall Risks  Fall risk Follow up  Falls evaluation completed Falls evaluation completed;Education provided;Falls prevention discussed Falls evaluation completed Falls evaluation completed   Functional Status Survey:    Vitals:   07/31/22 0844  BP: (!) 140/100  Pulse: 75  Resp: 16  Temp: 97.8 F (36.6 C)  SpO2: 96%  Weight: 176 lb 9.6 oz (80.1 kg)  Height: 5' 9" (1.753 m)   Body mass index is 26.08 kg/m. Physical Exam Vitals reviewed.  Constitutional:      General: He is not in acute distress.    Appearance: Normal  appearance. He is normal weight. He is not ill-appearing or diaphoretic.  HENT:     Head: Normocephalic.     Right Ear: Tympanic membrane, ear canal and external ear normal. There is no impacted cerumen.     Left Ear: Tympanic membrane, ear canal and external ear normal. There is no impacted cerumen.     Nose: Nose normal. No congestion or rhinorrhea.     Mouth/Throat:     Mouth: Mucous membranes are moist.     Pharynx: Oropharynx is clear. No oropharyngeal exudate or posterior oropharyngeal erythema.  Eyes:     General: No scleral icterus.       Right eye: No discharge.        Left eye: No discharge.     Extraocular Movements: Extraocular movements intact.     Conjunctiva/sclera: Conjunctivae normal.     Pupils: Pupils are equal, round, and reactive to light.  Neck:     Vascular: No carotid bruit.  Cardiovascular:     Rate and Rhythm: Normal rate  and regular rhythm.     Pulses: Normal pulses.     Heart sounds: Normal heart sounds. No murmur heard.    No friction rub. No gallop.  Pulmonary:     Effort: Pulmonary effort is normal. No respiratory distress.     Breath sounds: Normal breath sounds. No wheezing, rhonchi or rales.  Chest:     Chest wall: No tenderness.  Abdominal:     General: Bowel sounds are normal. There is no distension.     Palpations: Abdomen is soft. There is no mass.     Tenderness: There is no abdominal tenderness. There is no right CVA tenderness, left CVA tenderness, guarding or rebound.  Musculoskeletal:        General: No swelling or tenderness. Normal range of motion.     Cervical back: Normal range of motion. No rigidity or tenderness.     Right lower leg: No edema.     Left lower leg: No edema.  Lymphadenopathy:     Cervical: No cervical adenopathy.  Skin:    General: Skin is warm and dry.     Coloration: Skin is not pale.     Findings: No bruising, erythema, lesion or rash.  Neurological:     Mental Status: He is alert and oriented to person,  place, and time.     Cranial Nerves: No cranial nerve deficit.     Sensory: No sensory deficit.     Motor: No weakness.     Coordination: Coordination normal.     Gait: Gait normal.  Psychiatric:        Mood and Affect: Mood normal.        Speech: Speech normal.        Behavior: Behavior normal.        Thought Content: Thought content normal.        Judgment: Judgment normal.     Labs reviewed: Recent Labs    01/23/22 0913  NA 133*  K 3.7  CL 98  CO2 26  GLUCOSE 91  BUN 12  CREATININE 1.00  CALCIUM 9.4   Recent Labs    01/23/22 0913  AST 17  ALT 26  BILITOT 1.7*  PROT 6.9   Recent Labs    11/19/21 0802 01/23/22 0913  WBC 6.5 6.7  NEUTROABS 3.9 3,739  HGB 15.8 15.9  HCT 49.9 51.7*  MCV 71.9* 73.4*  PLT 187 203   Lab Results  Component Value Date   TSH 1.77 01/23/2022   No results found for: "HGBA1C" Lab Results  Component Value Date   CHOL 161 01/23/2022   HDL 42 01/23/2022   LDLCALC 92 01/23/2022   TRIG 175 (H) 01/23/2022   CHOLHDL 3.8 01/23/2022    Significant Diagnostic Results in last 30 days:  No results found.  Assessment/Plan  1. Hyperlipidemia LDL goal <100 TRG 175  - continue dietary modification and exercise. - Lipid panel  2. Essential hypertension Home and work B/p readings within normal range.elevated on arrival today but rechecked improved.Had 2 cups of coffee prior to visit. - Advised to check Blood pressure at home and record on log and notify provider if B/p > 140/90  - TSH - CMP with eGFR([facility]) - CBC with Differential/Platelet  3. Benign prostatic hyperplasia without lower urinary tract symptoms Stable  - continue on Silodosin  - continue to follow up with urologist  4. Erectile dysfunction, unspecified erectile dysfunction type Continue on Sildenafi and testosterone - Testosterone use contract updated.  -   Testosterone Cypionate 100 MG/ML SOLN; 10% GEL. 4 clicks daily.  Dispense: 100 mL; Refill: 3  Family/ staff  Communication: Reviewed plan of care with patient verbalized understanding.  Labs/tests ordered: Has lab orders in place.  Next Appointment : Return in about 6 months (around 01/31/2023) for medical mangement of chronic issues.Sandrea Hughs, NP

## 2022-08-01 LAB — CBC WITH DIFFERENTIAL/PLATELET
Absolute Monocytes: 381 cells/uL (ref 200–950)
Basophils Absolute: 28 cells/uL (ref 0–200)
Basophils Relative: 0.6 %
Eosinophils Absolute: 89 cells/uL (ref 15–500)
Eosinophils Relative: 1.9 %
HCT: 50.1 % — ABNORMAL HIGH (ref 38.5–50.0)
Hemoglobin: 15.2 g/dL (ref 13.2–17.1)
Lymphs Abs: 1368 cells/uL (ref 850–3900)
MCH: 21.7 pg — ABNORMAL LOW (ref 27.0–33.0)
MCHC: 30.3 g/dL — ABNORMAL LOW (ref 32.0–36.0)
MCV: 71.4 fL — ABNORMAL LOW (ref 80.0–100.0)
Monocytes Relative: 8.1 %
Neutro Abs: 2834 cells/uL (ref 1500–7800)
Neutrophils Relative %: 60.3 %
Platelets: 188 10*3/uL (ref 140–400)
RBC: 7.02 10*6/uL — ABNORMAL HIGH (ref 4.20–5.80)
RDW: 19.2 % — ABNORMAL HIGH (ref 11.0–15.0)
Total Lymphocyte: 29.1 %
WBC: 4.7 10*3/uL (ref 3.8–10.8)

## 2022-08-01 LAB — COMPLETE METABOLIC PANEL WITH GFR
AG Ratio: 1.9 (calc) (ref 1.0–2.5)
ALT: 24 U/L (ref 9–46)
AST: 23 U/L (ref 10–35)
Albumin: 4.4 g/dL (ref 3.6–5.1)
Alkaline phosphatase (APISO): 43 U/L (ref 35–144)
BUN: 10 mg/dL (ref 7–25)
CO2: 24 mmol/L (ref 20–32)
Calcium: 9.1 mg/dL (ref 8.6–10.3)
Chloride: 100 mmol/L (ref 98–110)
Creat: 1.16 mg/dL (ref 0.70–1.28)
Globulin: 2.3 g/dL (calc) (ref 1.9–3.7)
Glucose, Bld: 96 mg/dL (ref 65–99)
Potassium: 4.2 mmol/L (ref 3.5–5.3)
Sodium: 134 mmol/L — ABNORMAL LOW (ref 135–146)
Total Bilirubin: 1.3 mg/dL — ABNORMAL HIGH (ref 0.2–1.2)
Total Protein: 6.7 g/dL (ref 6.1–8.1)
eGFR: 68 mL/min/{1.73_m2} (ref >=60)

## 2022-08-01 LAB — LIPID PANEL
Cholesterol: 152 mg/dL (ref <200)
HDL: 42 mg/dL (ref >=40)
LDL Cholesterol (Calc): 91 mg/dL (calc)
Non-HDL Cholesterol (Calc): 110 mg/dL (calc) (ref <130)
Total CHOL/HDL Ratio: 3.6 (calc) (ref <5.0)
Triglycerides: 91 mg/dL (ref <150)

## 2022-08-01 LAB — TSH: TSH: 1.41 mIU/L (ref 0.40–4.50)

## 2022-09-07 ENCOUNTER — Other Ambulatory Visit: Payer: Self-pay | Admitting: Family

## 2022-09-07 DIAGNOSIS — I10 Essential (primary) hypertension: Secondary | ICD-10-CM

## 2022-09-08 ENCOUNTER — Telehealth: Payer: Self-pay | Admitting: Family

## 2022-09-08 ENCOUNTER — Telehealth: Payer: Self-pay | Admitting: *Deleted

## 2022-09-08 ENCOUNTER — Encounter: Payer: Self-pay | Admitting: *Deleted

## 2022-09-08 NOTE — Telephone Encounter (Signed)
Received call from pt stating he will be undergoing right total knee replacement with Raliegh Ip in December of 2023.  Pt states office will be faxing surgical clearance for MD to sign and return.

## 2022-09-08 NOTE — Telephone Encounter (Signed)
[facility] I[facility] surgical clearance papers faxed over. Place in provider review and sign folder. Message routed to PCP Ngetich, Nelda Bucks, NP as Juluis Rainier. No further action required. Patient will call and set up Surgical Clearance appointment. Once he's established appointment date for surgery.

## 2022-09-08 NOTE — Telephone Encounter (Signed)
Noted..will wait for patient to make surgical clearance appointment.

## 2022-09-08 NOTE — Progress Notes (Signed)
RN successfully faxed signed surgical clearance form to Raliegh Ip [phone]).

## 2022-09-08 NOTE — Telephone Encounter (Signed)
Pt called to report seeing orthopedic today. Pt states he is scheduling total knee replacement and wants to inform PCP to expect a surgical clearance form / request from orthopedic provider.

## 2022-09-29 ENCOUNTER — Ambulatory Visit (INDEPENDENT_AMBULATORY_CARE_PROVIDER_SITE_OTHER): Payer: Medicare HMO | Admitting: Family

## 2022-09-29 ENCOUNTER — Encounter: Payer: Self-pay | Admitting: Family

## 2022-09-29 VITALS — BP 142/100 | HR 77 | Temp 98.0°F | Resp 16 | Ht 69.0 in | Wt 181.2 lb

## 2022-09-29 DIAGNOSIS — M25561 Pain in right knee: Secondary | ICD-10-CM | POA: Diagnosis not present

## 2022-09-29 DIAGNOSIS — I10 Essential (primary) hypertension: Secondary | ICD-10-CM | POA: Diagnosis not present

## 2022-09-29 DIAGNOSIS — G8929 Other chronic pain: Secondary | ICD-10-CM

## 2022-09-29 DIAGNOSIS — Z01818 Encounter for other preprocedural examination: Secondary | ICD-10-CM | POA: Diagnosis not present

## 2022-09-29 NOTE — Patient Instructions (Signed)
Cleared medically for right total knee replacement.

## 2022-09-29 NOTE — Progress Notes (Signed)
Provider: Richarda Blade FNP-C  Espyn Radwan, Donalee Citrin, NP  Patient Care Team: Krystiana Fornes, Donalee Citrin, NP as PCP - General (Family Medicine) Serena Croissant, MD as Consulting Physician (Hematology and Oncology)  Extended Emergency Contact Information Primary Emergency Contact: Newnam,[name] Mobile Phone: [phone] Relation: Spouse Secondary Emergency Contact: [name],[name] Mobile Phone: [phone] Relation: Daughter  Code Status: Full Code  Goals of care: Advanced Directive information    09/29/2022    9:59 AM  Advanced Directives  Does Patient Have a Medical Advance Directive? Yes  Type of Estate agent of [place];Living will  Does patient want to make changes to medical advance directive? No - Patient declined  Copy of Healthcare Power of Attorney in Chart? No - copy requested     Chief Complaint  Patient presents with   Medical Clearance    Surgical clearance for Sergio [name] for Right Knee total replacement.     HPI:  Pt is a 71 y.o. male seen today for an acute visit for evaluation of surgical clearance for right knee total.He complains of right knee pain states takes Ibuprofen for pain. 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  He denies any headache,dizziness,vision changes,fatigue,chest tightness,palpitation,chest pain or shortness of breath.   States he is scheduled for right knee replacement in December,2023 with Dr.Murphy.    Past Medical History:  Diagnosis Date   Enlarged prostate    Erectile dysfunction    History of colonoscopy    History of COVID-19    Hypertension    Low testosterone    Low testosterone    Past Surgical History:  Procedure Laterality Date   RETINAL DETACHMENT SURGERY Right     No Known Allergies  Outpatient Encounter Medications as of 09/29/2022  Medication Sig   aspirin EC 81 MG tablet Take 81 mg by  mouth daily.   hydrochlorothiazide (HYDRODIURIL) 25 MG tablet TAKE 1 TABLET (25 MG TOTAL) BY MOUTH DAILY.   lisinopril (ZESTRIL) 20 MG tablet Take 1 tablet (20 mg total) by mouth daily.   Multiple Vitamin (MULTIVITAMIN) tablet Take 4 tablets by mouth daily.   sildenafil (REVATIO) 20 MG tablet Take 1 tablet (20 mg total) by mouth as needed.   silodosin (RAPAFLO) 8 MG CAPS capsule Take 1 capsule (8 mg total) by mouth daily with breakfast.   Testosterone Cypionate 100 MG/ML SOLN 10% GEL. 4 clicks daily.   No facility-administered encounter medications on file as of 09/29/2022.    Review of Systems  Constitutional:  Negative for appetite change, chills, fatigue, fever and unexpected weight change.  HENT:  Negative for congestion, dental problem, ear discharge, ear pain, facial swelling, hearing loss, nosebleeds, postnasal drip, rhinorrhea, sinus pressure, sinus pain, sneezing, sore throat, tinnitus and trouble swallowing.   Eyes:  Negative for pain, discharge, redness, itching and visual disturbance.  Respiratory:  Negative for cough, chest tightness, shortness of breath and wheezing.   Cardiovascular:  Negative for chest pain, palpitations and leg swelling.  Gastrointestinal:  Negative for abdominal distention, abdominal pain, blood in stool, constipation, diarrhea, nausea and vomiting.  Endocrine: Negative for cold intolerance, heat intolerance, polydipsia, polyphagia and polyuria.  Genitourinary:  Negative for difficulty urinating, dysuria, flank pain, frequency and urgency.  Musculoskeletal:  Positive for arthralgias. Negative for back pain, gait problem, joint swelling, myalgias, neck pain and neck stiffness.       Right knee pain   Skin:  Negative for color change, pallor, rash and wound.  Neurological:  Negative for dizziness, syncope, speech difficulty, weakness, light-headedness, numbness and headaches.  Hematological:  Does not bruise/bleed easily.  Psychiatric/Behavioral:  Negative  for agitation, behavioral problems, confusion, hallucinations, self-injury, sleep disturbance and suicidal ideas. The patient is not nervous/anxious.     Immunization History  Administered Date(s) Administered   Fluad Quad(high Dose 65+) 01/23/2022   Moderna Sars-Covid-2 Vaccination 12/11/2019, 01/08/2020   Pertinent  Health Maintenance Due  Topic Date Due   INFLUENZA VACCINE  07/07/2022   COLONOSCOPY (Pts 45-80yrs Insurance coverage will need to be confirmed)  10/18/2030      07/08/2021    3:50 PM 01/22/2022    4:52 PM 05/22/2022   10:22 AM 07/31/2022    8:49 AM 09/29/2022    9:59 AM  Fall Risk  Falls in the past year? 0 1 0 0 0  Was there an injury with Fall? 0 0 0 0 0  Fall Risk Category Calculator 0 1 0 0 0  Fall Risk Category Low Low Low Low Low  Patient Fall Risk Level Low fall risk Low fall risk Low fall risk Low fall risk Low fall risk  Patient at Risk for Falls Due to No Fall Risks History of fall(s) No Fall Risks No Fall Risks No Fall Risks  Fall risk Follow up Falls evaluation completed Falls evaluation completed;Education provided;Falls prevention discussed Falls evaluation completed Falls evaluation completed Falls evaluation completed   Functional Status Survey:    Vitals:   09/29/22 0955  BP: (!) 142/100  Pulse: 77  Resp: 16  Temp: 98 F (36.7 C)  SpO2: 98%  Weight: 181 lb 3.2 oz (82.2 kg)  Height: 5\' 9"  (1.753 m)   Body mass index is 26.76 kg/m. Physical Exam Vitals reviewed.  Constitutional:      General: He is not in acute distress.    Appearance: Normal appearance. He is normal weight. He is not ill-appearing or diaphoretic.  HENT:     Head: Normocephalic.     Right Ear: Tympanic membrane, ear canal and external ear normal. There is no impacted cerumen.     Left Ear: Tympanic membrane, ear canal and external ear normal. There is no impacted cerumen.     Nose: Nose normal. No congestion or rhinorrhea.     Mouth/Throat:     Mouth: Mucous membranes  are moist.     Pharynx: Oropharynx is clear. No oropharyngeal exudate or posterior oropharyngeal erythema.  Eyes:     General: No scleral icterus.       Right eye: No discharge.        Left eye: No discharge.     Extraocular Movements: Extraocular movements intact.     Conjunctiva/sclera: Conjunctivae normal.     Pupils: Pupils are equal, round, and reactive to light.  Neck:     Vascular: No carotid bruit.  Cardiovascular:     Rate and Rhythm: Normal rate and regular rhythm.     Pulses: Normal pulses.     Heart sounds: Normal heart sounds. No murmur heard.    No friction rub. No gallop.  Pulmonary:     Effort: Pulmonary effort is normal. No respiratory distress.     Breath sounds: Normal breath sounds. No wheezing, rhonchi or rales.  Chest:     Chest wall: No tenderness.  Abdominal:     General: Bowel sounds are normal. There is no distension.     Palpations: Abdomen is soft. There is  no mass.     Tenderness: There is no abdominal tenderness. There is no right CVA tenderness, left CVA tenderness, guarding or rebound.  Musculoskeletal:        General: No swelling. Normal range of motion.     Cervical back: Normal range of motion. No rigidity or tenderness.     Right knee: Swelling present. No effusion, erythema or ecchymosis. Normal range of motion. Tenderness present.     Left knee: Normal.     Right lower leg: No edema.     Left lower leg: No edema.  Lymphadenopathy:     Cervical: No cervical adenopathy.  Skin:    General: Skin is warm and dry.     Coloration: Skin is not pale.     Findings: No bruising, erythema, lesion or rash.  Neurological:     Mental Status: He is alert and oriented to person, place, and time.     Cranial Nerves: No cranial nerve deficit.     Sensory: No sensory deficit.     Motor: No weakness.     Coordination: Coordination normal.     Gait: Gait normal.  Psychiatric:        Mood and Affect: Mood normal.        Speech: Speech normal.         Behavior: Behavior normal.        Thought Content: Thought content normal.        Judgment: Judgment normal.     Labs reviewed: Recent Labs    01/23/22 0913 07/31/22 0917  NA 133* 134*  K 3.7 4.2  CL 98 100  CO2 26 24  GLUCOSE 91 96  BUN 12 10  CREATININE 1.00 1.16  CALCIUM 9.4 9.1   Recent Labs    01/23/22 0913 07/31/22 0917  AST 17 23  ALT 26 24  BILITOT 1.7* 1.3*  PROT 6.9 6.7   Recent Labs    11/19/21 0802 01/23/22 0913 07/31/22 0917  WBC 6.5 6.7 4.7  NEUTROABS 3.9 3,739 2,834  HGB 15.8 15.9 15.2  HCT 49.9 51.7* 50.1*  MCV 71.9* 73.4* 71.4*  PLT 187 203 188   Lab Results  Component Value Date   TSH 1.41 07/31/2022   No results found for: "HGBA1C" Lab Results  Component Value Date   CHOL 152 07/31/2022   HDL 42 07/31/2022   LDLCALC 91 07/31/2022   TRIG 91 07/31/2022   CHOLHDL 3.6 07/31/2022    Significant Diagnostic Results in last 30 days:  No results found.  Assessment/Plan  1. Pre-operative clearance - EKG 12-Lead indicates normal sinus rhythm with arterial enlargement nonspecific T wave heart rate 76 no changes compared to EKG done 09/29/2022 - medically cleared foe right knee replacement.   2. Essential hypertension B/p elevated today but reports b/p checked at work has been with normal range SBP 120's.Attributes high B/p to right knee pain.  Continue on Hydrochlorothiazide and Lisinopril.  - Advised to check Blood pressure at home and record on log provided and notify provider if B/p > 140/90   3. Chronic pain of right knee Continue current pain management regimen   Family/ staff Communication: Reviewed plan of care with patient  Labs/tests ordered: None   Next Appointment: Return if symptoms worsen or fail to improve.   Sandrea Hughs, NP

## 2022-10-06 ENCOUNTER — Encounter: Payer: Self-pay | Admitting: Hematology and Oncology

## 2022-10-06 ENCOUNTER — Encounter: Payer: Self-pay | Admitting: Internal Medicine

## 2022-10-06 ENCOUNTER — Ambulatory Visit: Payer: Medicare HMO | Admitting: Internal Medicine

## 2022-10-06 VITALS — BP 133/95 | HR 80 | Ht 67.0 in | Wt 174.8 lb

## 2022-10-06 DIAGNOSIS — Z01818 Encounter for other preprocedural examination: Secondary | ICD-10-CM | POA: Insufficient documentation

## 2022-10-06 DIAGNOSIS — I10 Essential (primary) hypertension: Secondary | ICD-10-CM

## 2022-10-06 DIAGNOSIS — R9431 Abnormal electrocardiogram [ECG] [EKG]: Secondary | ICD-10-CM | POA: Insufficient documentation

## 2022-10-06 NOTE — Progress Notes (Signed)
Primary Physician/Referring:  Sandrea Hughs, NP  Patient ID: [name] Sergio, male    DOB: 05-10-1951, 71 y.o.   MRN: [id number]  Chief Complaint  Patient presents with   New Patient (Initial Visit)        Pre-op Exam   Abnormal ECG   Hypertension   HPI:    Sergio [name]  is a 71 y.o. male with hypertension who is here to establish care with cardiology.  Patient is looking to have a knee replacement before the end of the year and his surgeon is requesting clearance from hematology, internal medicine, and cardiology.  Patient does not smoke or drink alcohol.  He has never had to see a cardiologist in the past.  He denies cardiac history in himself.  Patient does not have any chest pain, shortness of breath, palpitations, diaphoresis, syncope, edema, orthopnea, claudication.  Since he has not had a stress test or echo we will obtain the studies prior to clearing the patient for knee surgery.  Patient is agreeable to this.  He will follow-up in a few months after his surgery to check in on his blood pressure. This patient has been doing well.  Past Medical History:  Diagnosis Date   Enlarged prostate    Erectile dysfunction    History of colonoscopy    History of COVID-19    Hypertension    Low testosterone    Low testosterone    Past Surgical History:  Procedure Laterality Date   RETINAL DETACHMENT SURGERY Right    Family History  Problem Relation Age of Onset   Heart failure Mother    Heart failure Father    Arthritis Sister    Arthritis Sister    Arthritis Son     Social History   Tobacco Use   Smoking status: Never   Smokeless tobacco: Never  Substance Use Topics   Alcohol use: Never   Marital Status: Married  ROS  Review of Systems  Cardiovascular:  Positive for irregular heartbeat.   Objective  Blood pressure (!) 133/95, pulse 80, height 5' 7" (1.702 m), weight 174 lb 12.8 oz (79.3 kg), SpO2 97 %. Body mass index is 27.38 kg/m.     10/06/2022     1:21 PM 09/29/2022    9:55 AM 07/31/2022    9:14 AM  Vitals with BMI  Height 5' 7" 5' 9"   Weight 174 lbs 13 oz 181 lbs 3 oz   BMI 85.27 78.24   Systolic 235 361 443  Diastolic 95 154 80  Pulse 80 77      Physical Exam Vitals reviewed.  HENT:     Head: Normocephalic and atraumatic.  Cardiovascular:     Rate and Rhythm: Normal rate and regular rhythm.     Pulses: Normal pulses.     Heart sounds: Normal heart sounds. No murmur heard. Pulmonary:     Effort: Pulmonary effort is normal.     Breath sounds: Normal breath sounds.  Abdominal:     General: Bowel sounds are normal.  Musculoskeletal:     Right lower leg: No edema.     Left lower leg: No edema.  Skin:    General: Skin is warm and dry.  Neurological:     Mental Status: He is alert.     Medications and allergies  No Known Allergies   Medication list after today's encounter   Current Outpatient Medications:    aspirin EC 81 MG tablet, Take 81 mg  by mouth daily., Disp: , Rfl:    hydrochlorothiazide (HYDRODIURIL) 25 MG tablet, TAKE 1 TABLET (25 MG TOTAL) BY MOUTH DAILY., Disp: 90 tablet, Rfl: 1   lisinopril (ZESTRIL) 20 MG tablet, Take 1 tablet (20 mg total) by mouth daily., Disp: 90 tablet, Rfl: 1   Multiple Vitamin (MULTIVITAMIN) tablet, Take 4 tablets by mouth daily., Disp: , Rfl:    sildenafil (REVATIO) 20 MG tablet, Take 1 tablet (20 mg total) by mouth as needed., Disp: 10 tablet, Rfl: 0   silodosin (RAPAFLO) 8 MG CAPS capsule, Take 1 capsule (8 mg total) by mouth daily with breakfast., Disp: 30 capsule, Rfl:    Testosterone Cypionate 100 MG/ML SOLN, 10% GEL. 4 clicks daily., Disp: 100 mL, Rfl: 3  Laboratory examination:   Lab Results  Component Value Date   NA 134 (L) 07/31/2022   K 4.2 07/31/2022   CO2 24 07/31/2022   GLUCOSE 96 07/31/2022   BUN 10 07/31/2022   CREATININE 1.16 07/31/2022   CALCIUM 9.1 07/31/2022   EGFR 68 07/31/2022   GFRNONAA >60 10/24/2018       Latest Ref Rng & Units 07/31/2022     9:17 AM 01/23/2022    9:13 AM 07/09/2021   10:06 AM  CMP  Glucose 65 - 99 mg/dL 96  91  80   BUN 7 - 25 mg/dL _0 Creatinine 0.70 - 1.28 mg/dL 1.16  1.00  0.89   Sodium 135 - 146 mmol/L 134  133  133   Potassium 3.5 - 5.3 mmol/L 4.2  3.7  3.7   Chloride 98 - 110 mmol/L 100  98  97   CO2 20 - 32 mmol/L _1 Calcium 8.6 - 10.3 mg/dL 9.1  9.4  9.4   Total Protein 6.1 - 8.1 g/dL 6.7  6.9  6.9   Total Bilirubin 0.2 - 1.2 mg/dL 1.3  1.7  1.8   AST 10 - 35 U/L _2 ALT 9 - 46 U/L _3 Latest Ref Rng & Units 07/31/2022    9:17 AM 01/23/2022    9:13 AM 11/19/2021    8:02 AM  CBC  WBC 3.8 - 10.8 Thousand/uL 4.7  6.7  6.5   Hemoglobin 13.2 - 17.1 g/dL 15.2  15.9  15.8   Hematocrit 38.5 - 50.0 % 50.1  51.7  49.9   Platelets 140 - 400 Thousand/uL 188  203  187     Lipid Panel Recent Labs    01/23/22 0913 07/31/22 0917  CHOL 161 152  TRIG 175* 91  LDLCALC 92 91  HDL 42 42  CHOLHDL 3.8 3.6    HEMOGLOBIN A1C No results found for: "HGBA1C", "MPG" TSH Recent Labs    01/23/22 0913 07/31/22 0917  TSH 1.77 1.41    External labs:     Radiology:    Cardiac Studies:   No results found for this or any previous visit from the past 1095 days.     No results found for this or any previous visit from the past 1095 days.     EKG:   10/06/2022 Sinus Rhythm, iRBBB, Left atrial enlargement. Nonspecific T-abnormality  Assessment     ICD-10-CM   1. Nonspecific abnormal electrocardiogram (ECG) (EKG)  R94.31 EKG 12-Lead    PCV ECHOCARDIOGRAM COMPLETE    PCV MYOCARDIAL PERFUSION WITH LEXISCAN  2. Essential hypertension  I10 PCV ECHOCARDIOGRAM COMPLETE    PCV MYOCARDIAL PERFUSION WITH LEXISCAN    3. Pre-op evaluation  Z01.818 PCV ECHOCARDIOGRAM COMPLETE    PCV MYOCARDIAL PERFUSION WITH LEXISCAN       Orders Placed This Encounter  Procedures   PCV MYOCARDIAL PERFUSION WITH LEXISCAN    Standing Status:   Future    Standing  Expiration Date:   12/06/2022   EKG 12-Lead   PCV ECHOCARDIOGRAM COMPLETE    Standing Status:   Future    Standing Expiration Date:   10/07/2023    No orders of the defined types were placed in this encounter.   There are no discontinued medications.   Recommendations:   KHANI PAINO is a 71 y.o.  male with hypertension   Nonspecific abnormal electrocardiogram (ECG) (EKG) Echocardiogram ordered   Essential hypertension Continue current cardiac medications. Encourage low-sodium diet, less than 2000 mg daily. Follow-up in 3 months or sooner if needed.   Pre-op evaluation Stress test ordered Will clear pt for knee surgery once at least stress test results are back     Floydene Flock, DO, [facility]  10/06/2022, 2:21 PM Office: [phone] Pager: [phone]

## 2022-10-13 ENCOUNTER — Telehealth (INDEPENDENT_AMBULATORY_CARE_PROVIDER_SITE_OTHER): Payer: Medicare HMO | Admitting: Family

## 2022-10-13 ENCOUNTER — Encounter: Payer: Self-pay | Admitting: Family

## 2022-10-13 DIAGNOSIS — R195 Other fecal abnormalities: Secondary | ICD-10-CM

## 2022-10-13 DIAGNOSIS — J069 Acute upper respiratory infection, unspecified: Secondary | ICD-10-CM | POA: Diagnosis not present

## 2022-10-13 MED ORDER — AMOXICILLIN-POT CLAVULANATE 875-125 MG PO TABS: 1.0000 | ORAL_TABLET | Freq: Two times a day (BID) | ORAL | 0 refills | Status: AC

## 2022-10-13 NOTE — Progress Notes (Signed)
This service is provided via telemedicine  No vital signs collected/recorded due to the encounter was a telemedicine visit.   Location of patient (ex: home, work):  Home.  Patient consents to a telephone visit:  Yes  Location of the provider (ex: office, home):  [employer].  Name of any referring provider:  Ava Tangney, Nelda Bucks, NP   Names of all persons participating in the telemedicine service and their role in the encounter:  Patient, Sergio [name], [place], [place], Webb Silversmith, NP.    Time spent on call:  8 minutes spent on the phone with Medical Assistant.      Provider: Marlowe Sax FNP-C  Nyashia Raney, Nelda Bucks, NP  Patient Care Team: Saliou Barnier, Nelda Bucks, NP as PCP - General (Family Medicine) Nicholas Lose, MD as Consulting Physician (Hematology and Oncology)  Extended Emergency Contact Information Primary Emergency Contact: Jerry,[name] Mobile Phone: [phone] Relation: Spouse Secondary Emergency Contact: [place] Mobile Phone: [phone] Relation: Daughter  Code Status:  Full Code  Goals of care: Advanced Directive information    10/13/2022    9:40 AM  Advanced Directives  Does Patient Have a Medical Advance Directive? Yes  Type of Paramedic of [place];Living will  Does patient want to make changes to medical advance directive? No - Patient declined  Copy of [place] in Chart? No - copy requested     Chief Complaint  Patient presents with   Acute Visit    Patient complains of runny nose, coughing, and loose bowels. Patient states symptoms started Sunday afternoon 10/11/2022.     HPI:  Pt is a 71 y.o. male seen today for an acute visit for evaluation of cough,congestion and runny nose x 3 days.States tested negative for COVID-19 this morning.Has take NightQuil and halls cough drops which seems to have helped.Not feeling ache.Has felt better today.also drinks Gatorade daily.Has also had loose  stool but no diarrhea.He denies any fever,chills,body aches ,Nausea or vomiting.Appetite has been good.blood pressure this morning SBP was in the 120's.  Also states was seen by cardiologist on pre-op clearance for knee pain and abnormal EKG.states Stress test and ECHO ordered.   Past Medical History:  Diagnosis Date   Enlarged prostate    Erectile dysfunction    History of colonoscopy    History of COVID-19    Hypertension    Low testosterone    Low testosterone    Past Surgical History:  Procedure Laterality Date   RETINAL DETACHMENT SURGERY Right     No Known Allergies  Outpatient Encounter Medications as of 10/13/2022  Medication Sig   aspirin EC 81 MG tablet Take 81 mg by mouth daily.   hydrochlorothiazide (HYDRODIURIL) 25 MG tablet TAKE 1 TABLET (25 MG TOTAL) BY MOUTH DAILY.   lisinopril (ZESTRIL) 20 MG tablet Take 1 tablet (20 mg total) by mouth daily.   Multiple Vitamin (MULTIVITAMIN) tablet Take 4 tablets by mouth daily.   sildenafil (REVATIO) 20 MG tablet Take 1 tablet (20 mg total) by mouth as needed.   silodosin (RAPAFLO) 8 MG CAPS capsule Take 1 capsule (8 mg total) by mouth daily with breakfast.   Testosterone Cypionate 100 MG/ML SOLN 10% GEL. 4 clicks daily.   No facility-administered encounter medications on file as of 10/13/2022.    Review of Systems  Constitutional:  Negative for appetite change, chills, fatigue and fever.  HENT:  Positive for congestion, rhinorrhea and sneezing. Negative for ear pain, sinus pressure, sinus pain, sore throat, tinnitus and  trouble swallowing.   Respiratory:  Positive for cough. Negative for chest tightness, shortness of breath and wheezing.   Cardiovascular:  Negative for chest pain, palpitations and leg swelling.  Gastrointestinal:  Negative for abdominal distention, abdominal pain, blood in stool, constipation, diarrhea, nausea and vomiting.       Loose stool   Genitourinary:  Negative for difficulty urinating, dysuria,  hematuria and urgency.  Neurological:  Negative for dizziness, light-headedness and headaches.    Immunization History  Administered Date(s) Administered   Fluad Quad(high Dose 65+) 01/23/2022   Moderna Sars-Covid-2 Vaccination 12/11/2019, 01/08/2020   Pertinent  Health Maintenance Due  Topic Date Due   INFLUENZA VACCINE  07/07/2022   COLONOSCOPY (Pts 45-63yrs Insurance coverage will need to be confirmed)  10/18/2030      01/22/2022    4:52 PM 05/22/2022   10:22 AM 07/31/2022    8:49 AM 09/29/2022    9:59 AM 10/13/2022    9:40 AM  Fall Risk  Falls in the past year? 1 0 0 0 0  Was there an injury with Fall? 0 0 0 0 0  Fall Risk Category Calculator 1 0 0 0 0  Fall Risk Category Low Low Low Low Low  Patient Fall Risk Level Low fall risk Low fall risk Low fall risk Low fall risk Low fall risk  Patient at Risk for Falls Due to History of fall(s) No Fall Risks No Fall Risks No Fall Risks No Fall Risks  Fall risk Follow up Falls evaluation completed;Education provided;Falls prevention discussed Falls evaluation completed Falls evaluation completed Falls evaluation completed Falls evaluation completed   Functional Status Survey:    There were no vitals filed for this visit. There is no height or weight on file to calculate BMI. Physical Exam Unable to complete on telephone visit  Labs reviewed: Recent Labs    01/23/22 0913 07/31/22 0917  NA 133* 134*  K 3.7 4.2  CL 98 100  CO2 26 24  GLUCOSE 91 96  BUN 12 10  CREATININE 1.00 1.16  CALCIUM 9.4 9.1   Recent Labs    01/23/22 0913 07/31/22 0917  AST 17 23  ALT 26 24  BILITOT 1.7* 1.3*  PROT 6.9 6.7   Recent Labs    11/19/21 0802 01/23/22 0913 07/31/22 0917  WBC 6.5 6.7 4.7  NEUTROABS 3.9 3,739 2,834  HGB 15.8 15.9 15.2  HCT 49.9 51.7* 50.1*  MCV 71.9* 73.4* 71.4*  PLT 187 203 188   Lab Results  Component Value Date   TSH 1.41 07/31/2022   No results found for: "HGBA1C" Lab Results  Component Value Date    CHOL 152 07/31/2022   HDL 42 07/31/2022   LDLCALC 91 07/31/2022   TRIG 91 07/31/2022   CHOLHDL 3.6 07/31/2022    Significant Diagnostic Results in last 30 days:  No results found.  Assessment/Plan 1. Upper respiratory infection with cough and congestion Afebrile  - no reports of shortness of breath. - COVID-19 home test was negative.No contact with sick person with COVID-19  - start on Augmentin as below to use only if symptoms worsen or running a fever or chills.  - Vitamin C 500 mg tablet one by mouth twice daily x 14 days  - Encouraged to increase fluid intake  - continue OTC Nightquil or mucinex for cough  - continue OTC MVI,Halls cough drops. - amoxicillin-clavulanate (AUGMENTIN) 875-125 MG tablet; Take 1 tablet by mouth 2 (two) times daily for 7 days.  Dispense: 14  tablet; Refill: 0 - notify provider if symptoms worsen or fail to improve   2. Loose stools OTC antidiarrhea as needed. Appetite is good.  Family/ staff Communication: Reviewed plan of care with patient verbalized understanding   Labs/tests ordered: None   Next Appointment: Return if symptoms worsen or fail to improve.  I connected with  Reola Mosher on 10/13/22 by a Telephone enabled telemedicine application and verified that I am speaking with the correct person using two identifiers.   I discussed the limitations of evaluation and management by telemedicine. The patient expressed understanding and agreed to proceed.  Spent 11 minutes of non-face to face with patient  >50% time spent counseling; reviewing medical record; labs; and developing future plan of care.   Caesar Bookman, NP

## 2022-10-15 ENCOUNTER — Other Ambulatory Visit: Payer: Medicare HMO

## 2022-10-15 ENCOUNTER — Ambulatory Visit: Payer: Medicare HMO | Admitting: Hematology and Oncology

## 2022-10-20 ENCOUNTER — Ambulatory Visit: Payer: Medicare HMO

## 2022-10-20 DIAGNOSIS — R9431 Abnormal electrocardiogram [ECG] [EKG]: Secondary | ICD-10-CM

## 2022-10-20 DIAGNOSIS — I10 Essential (primary) hypertension: Secondary | ICD-10-CM

## 2022-10-20 DIAGNOSIS — Z01818 Encounter for other preprocedural examination: Secondary | ICD-10-CM

## 2022-10-21 ENCOUNTER — Telehealth: Payer: Self-pay

## 2022-10-21 NOTE — Telephone Encounter (Signed)
Noted  

## 2022-10-21 NOTE — Telephone Encounter (Signed)
Cardiologist office to sign clearance form then fax to [facility].

## 2022-10-21 NOTE — Telephone Encounter (Signed)
Patient called and states that Echo is cleared. Patient states that he wants you to view and let Dewaine Conger know results so that he can get scheduled for surgery. Message routed to PCP Ngetich, Donalee Citrin, NP

## 2022-10-23 ENCOUNTER — Telehealth: Payer: Self-pay | Admitting: Hematology and Oncology

## 2022-10-23 NOTE — Telephone Encounter (Signed)
Rescheduled appointment per provider. Patient is aware. 

## 2022-10-26 ENCOUNTER — Inpatient Hospital Stay: Payer: Medicare HMO

## 2022-10-26 ENCOUNTER — Inpatient Hospital Stay: Payer: Medicare HMO | Admitting: Hematology and Oncology

## 2022-10-28 NOTE — Progress Notes (Signed)
Patient Care Team: Ngetich, Donalee Citrin, NP as PCP - General (Family Medicine) Serena Croissant, MD as Consulting Physician (Hematology and Oncology)  DIAGNOSIS:  Encounter Diagnosis  Name Primary?   Secondary erythrocytosis Yes      CHIEF COMPLIANT: Follow-up secondary erythrocytosis  INTERVAL HISTORY: Sergio [name] is a 71 y.o. 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 He presents to the clinic today for follow-up. He states that he is getting ready to have a full knee replacement.   ALLERGIES:  has No Known Allergies.  MEDICATIONS:  Current Outpatient Medications  Medication Sig Dispense Refill   aspirin EC 81 MG tablet Take 81 mg by mouth daily.     hydrochlorothiazide (HYDRODIURIL) 25 MG tablet TAKE 1 TABLET (25 MG TOTAL) BY MOUTH DAILY. 90 tablet 1   lisinopril (ZESTRIL) 20 MG tablet Take 1 tablet (20 mg total) by mouth daily. 90 tablet 1   Multiple Vitamin (MULTIVITAMIN) tablet Take 4 tablets by mouth daily.     sildenafil (REVATIO) 20 MG tablet Take 1 tablet (20 mg total) by mouth as needed. 10 tablet 0   silodosin (RAPAFLO) 8 MG CAPS capsule Take 1 capsule (8 mg total) by mouth daily with breakfast. 30 capsule    Testosterone Cypionate 100 MG/ML SOLN 10% GEL. 4 clicks daily. 100 mL 3   No current facility-administered medications for this visit.    PHYSICAL EXAMINATION: ECOG PERFORMANCE STATUS: 1 - Symptomatic but completely ambulatory  Vitals:   11/02/22 0831  BP: (!) 153/87  Pulse: 79  Resp: 18  Temp: 97.7 F (36.5 C)  SpO2: 100%   Filed Weights   11/02/22 0831  Weight: 176 lb 14.4 oz (80.2 kg)      LABORATORY DATA:  I have reviewed the data as listed    Latest Ref Rng & Units 07/31/2022    9:17 AM 01/23/2022    9:13 AM 07/09/2021   10:06 AM  CMP  Glucose 65 - 99 mg/dL 96  91  80   BUN 7 - 25 mg/dL 10  12  10    Creatinine 0.70 - 1.28 mg/dL   6.95  0.72   Sodium 135 - 146 mmol/L 134  133  133   Potassium 3.5 - 5.3 mmol/L 4.2  3.7  3.7   Chloride 98 - 110 mmol/L 100  98  97   CO2 20 - 32 mmol/L 24  26  24    Calcium 8.6 - 10.3 mg/dL 9.1  9.4  9.4   Total Protein 6.1 - 8.1 g/dL 6.7  6.9  6.9   Total Bilirubin 0.2 - 1.2 mg/dL 1.3  1.7  1.8   AST 10 - 35 U/L 23  17  21    ALT 9 - 46 U/L 24  26  21      Lab Results  Component Value Date   WBC 5.6 11/02/2022   HGB 15.3 11/02/2022   HCT 48.7 11/02/2022   MCV 69.9 (L) 11/02/2022   PLT 218 11/02/2022   NEUTROABS 3.4 11/02/2022    ASSESSMENT & PLAN:  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 Previous work-up 2019: MPN panel: Negative for Jak 2, MPL, CALR Erythropoietin 11.3, hemoglobin 19.9   Current treatment: Phlebotomies every 2 weeks started 11/11/2018 switched to monthly phlebotomies 02/21/2019 switch to every 2 months 05/22/2019, switched to every 57-month starting 11/20/2019   Lab review:  07/31/22: Hemoglobin 15.2  11/02/2022:  Hemoglobin 15.3   Patient works as a Lawyer: Every 2 months phlebotomies at [employer].    He has made replacement surgery) in December.  We will hold off on phlebotomy for that month. Return to clinic in 6 months for follow-up with labs    No orders of the defined types were placed in this encounter.  The patient has a good understanding of the overall plan. he agrees with it. he will call with any problems that may develop before the next visit here. Total time spent: 30 mins including face to face time and time spent for planning, charting and co-ordination of care   Tamsen Meek, MD 11/02/22    I Janan Ridge am scribing for Dr. Pamelia Hoit  I have reviewed the above documentation for accuracy and completeness, and I agree with the above.

## 2022-10-30 ENCOUNTER — Other Ambulatory Visit: Payer: Self-pay | Admitting: *Deleted

## 2022-10-30 DIAGNOSIS — D751 Secondary polycythemia: Secondary | ICD-10-CM

## 2022-11-02 ENCOUNTER — Inpatient Hospital Stay: Payer: Medicare HMO | Attending: Hematology and Oncology

## 2022-11-02 ENCOUNTER — Inpatient Hospital Stay (HOSPITAL_BASED_OUTPATIENT_CLINIC_OR_DEPARTMENT_OTHER): Payer: Medicare HMO | Admitting: Hematology and Oncology

## 2022-11-02 VITALS — BP 153/87 | HR 79 | Temp 97.7°F | Resp 18 | Ht 67.0 in | Wt 176.9 lb

## 2022-11-02 DIAGNOSIS — D751 Secondary polycythemia: Secondary | ICD-10-CM

## 2022-11-02 LAB — CBC WITH DIFFERENTIAL (CANCER CENTER ONLY)
Abs Immature Granulocytes: 0.02 10*3/uL (ref 0.00–0.07)
Basophils Absolute: 0 10*3/uL (ref 0.0–0.1)
Basophils Relative: 1 %
Eosinophils Absolute: 0.1 10*3/uL (ref 0.0–0.5)
Eosinophils Relative: 3 %
HCT: 48.7 % (ref 39.0–52.0)
Hemoglobin: 15.3 g/dL (ref 13.0–17.0)
Immature Granulocytes: 0 %
Lymphocytes Relative: 26 %
Lymphs Abs: 1.5 10*3/uL (ref 0.7–4.0)
MCH: 22 pg — ABNORMAL LOW (ref 26.0–34.0)
MCHC: 31.4 g/dL (ref 30.0–36.0)
MCV: 69.9 fL — ABNORMAL LOW (ref 80.0–100.0)
Monocytes Absolute: 0.6 10*3/uL (ref 0.1–1.0)
Monocytes Relative: 10 %
Neutro Abs: 3.4 10*3/uL (ref 1.7–7.7)
Neutrophils Relative %: 60 %
Platelet Count: 218 10*3/uL (ref 150–400)
RBC: 6.97 MIL/uL — ABNORMAL HIGH (ref 4.22–5.81)
RDW: 22.2 % — ABNORMAL HIGH (ref 11.5–15.5)
WBC Count: 5.6 10*3/uL (ref 4.0–10.5)
nRBC: 0 % (ref 0.0–0.2)

## 2022-11-02 NOTE — H&P (Signed)
KNEE ARTHROPLASTY ADMISSION H&P  Patient ID: Sergio [name] MRN: [id number] DOB/AGE: 1951-03-26 71 y.o.  Chief Complaint: right knee pain.  Planned Procedure Date: 11/23/22 Medical Clearance by Richarda Blade NP Cardiac Clearance by Dr. Clotilde Dieter Hematology clearance by Dr. Pamelia Hoit   HPI: Sergio [name] is a 71 y.o. male who presents for evaluation of OA RIGHT KNEE. The patient has a history of pain and functional disability in the right knee due to arthritis and has failed non-surgical conservative treatments for greater than 12 weeks to include NSAID's and/or analgesics, corticosteriod injections, use of assistive devices, and activity modification.  Onset of symptoms was abrupt, starting  11  months ago with rapidlly worsening course since that time. The patient noted no past surgery on the right knee.  Patient currently rates pain at 6 out of 10 with activity. Patient has night pain, worsening of pain with activity and weight bearing, and pain that interferes with activities of daily living.  Patient has evidence of subchondral cysts, subchondral sclerosis, periarticular osteophytes, and joint space narrowing by imaging studies.  There is no active infection.  Past Medical History:  Diagnosis Date   Enlarged prostate    Erectile dysfunction    History of colonoscopy    History of COVID-19    Hypertension    Low testosterone    Low testosterone    Past Surgical History:  Procedure Laterality Date   RETINAL DETACHMENT SURGERY Right    No Known Allergies Prior to Admission medications   Medication Sig Start Date End Date Taking? Authorizing Provider  aspirin EC 81 MG tablet Take 81 mg by mouth daily.    [provider]  hydrochlorothiazide (HYDRODIURIL) 25 MG tablet TAKE 1 TABLET (25 MG TOTAL) BY MOUTH DAILY. 07/27/22   Ngetich, Dinah C, NP  lisinopril (ZESTRIL) 20 MG tablet Take 1 tablet (20 mg total) by mouth daily. 05/22/22   Sharon Seller, NP  Multiple  Vitamin (MULTIVITAMIN) tablet Take 4 tablets by mouth daily.    [provider]  sildenafil (REVATIO) 20 MG tablet Take 1 tablet (20 mg total) by mouth as needed. 05/21/21   Serena Croissant, MD  silodosin (RAPAFLO) 8 MG CAPS capsule Take 1 capsule (8 mg total) by mouth daily with breakfast. 11/20/20   Serena Croissant, MD  Testosterone Cypionate 100 MG/ML SOLN 10% GEL. 4 clicks daily. 07/31/22   Ngetich, Donalee Citrin, NP   Social History   Socioeconomic History   Marital status: Married    Spouse name: [name]   Number of children: 2   Years of education: Not on file   Highest education level: Not on file  Occupational History   Not on file  Tobacco Use   Smoking status: Never   Smokeless tobacco: Never  Vaping Use   Vaping Use: Never used  Substance and Sexual Activity   Alcohol use: Never   Drug use: Never   Sexual activity: Yes  Other Topics Concern   Not on file  Social History Narrative   Tobacco use, amount per day now:   Past tobacco use, amount per day:   How many years did you use tobacco:   Alcohol use (drinks per week):   Diet:   Do you drink/eat things with caffeine: Yes   Marital status:  Married                                What  year were you married? 1974   Do you live in a house, apartment, assisted living, condo, trailer, etc.? House   Is it one or more stories? 1   How many persons live in your home? 2   Do you have pets in your home?( please list) No   Highest Level of education completed? 14   Current or past profession: Retired [employer] you exercise?   No                               Type and how often?   Do you have a living will? Yes   Do you have a DNR form?                                   If not, do you want to discuss one? No   Do you have signed POA/HPOA forms?   Yes                     If so, please bring to you appointment      Do you have any difficulty bathing or dressing yourself? No   Do you have any difficulty preparing food or  eating? No   Do you have any difficulty managing your medications? No   Do you have any difficulty managing your finances? No   Do you have any difficulty affording your medications? No   Social Determinants of Corporate investment banker Strain: Not on file  Food Insecurity: Not on file  Transportation Needs: Not on file  Physical Activity: Not on file  Stress: Not on file  Social Connections: Not on file   Family History  Problem Relation Age of Onset   Heart failure Mother    Heart failure Father    Arthritis Sister    Arthritis Sister    Arthritis Son     ROS: Currently denies lightheadedness, dizziness, Fever, chills, CP, SOB.   No personal history of DVT, PE, MI, or CVA. No loose teeth or dentures All other systems have been reviewed and were otherwise currently negative with the exception of those mentioned in the HPI and as above.  Objective: Vitals: Ht: 5'7" Wt: 175 lbs Temp: 98.2 BP: 143/79 Pulse: 81 O2 95% on room air.   Physical Exam: General: Alert, NAD.  Antalgic Gait  HEENT: EOMI, Good Neck Extension  Pulm: No increased work of breathing.  Clear B/L A/P w/o crackle or wheeze.  CV: RRR, No m/g/r appreciated  GI: soft, NT, ND. BS x 4 quadrants Neuro: CN II-XII grossly intact without focal deficit.  Sensation intact distally Skin: No lesions in the area of chief complaint MSK/Surgical Site:  + JLT. ROM 5-110 degrees.  5/5 strength in extension and flexion.  +EHL/FHL.  NVI.  Pain and instability with varus and valgus stress.    Imaging Review Plain radiographs demonstrate moderate degenerative joint disease of the right knee.   The overall alignment isneutral. The bone quality appears to be fair for age and reported activity level.  Preoperative templating of the joint replacement has been completed, documented, and submitted to the Operating Room personnel in order to optimize intra-operative equipment management.  Assessment: OA RIGHT KNEE Active  Problems:   * No active hospital problems. *   Plan: Plan for Procedure(s): TOTAL KNEE ARTHROPLASTY  The patient history, physical exam, clinical judgement of the provider and imaging are consistent with end stage degenerative joint disease and total joint arthroplasty is deemed medically necessary. The treatment options including medical management, injection therapy, and arthroplasty were discussed at length. The risks and benefits of Procedure(s): TOTAL KNEE ARTHROPLASTY were presented and reviewed.  The risks of nonoperative treatment, versus surgical intervention including but not limited to continued pain, aseptic loosening, stiffness, dislocation/subluxation, infection, bleeding, nerve injury, blood clots, cardiopulmonary complications, morbidity, mortality, among others were discussed. The patient verbalizes understanding and wishes to proceed with the plan.  Patient is being admitted for inpatient treatment for surgery, pain control, PT, prophylactic antibiotics, VTE prophylaxis, progressive ambulation, ADL's and discharge planning.   Dental prophylaxis discussed and recommended for 2 years postoperatively.  The patient does meet the criteria for TXA which will be used perioperatively.   ASA 81 mg BID will be used postoperatively for DVT prophylaxis in addition to SCDs, and early ambulation. Plan for Tylenol, Mobic, oxycodone for pain.    Zofran for nausea and vomiting. Miralax for constipation prevention [facility]acy- [facility] [place] The patient is planning to be discharged home with OPPT  and into the care of his wife Agustin Cree who can be reached at [phone] Follow up appt 12/08/21 at 11:15am     [place] Board Office [phone] 11/02/2022 5:59 PM

## 2022-11-02 NOTE — Assessment & Plan Note (Addendum)
Due to testosterone replacement therapy with androgen. Previous work-up 2019: MPN panel: Negative for Jak 2, MPL, CALR Erythropoietin 11.3, hemoglobin 19.9   Current treatment: Phlebotomies every 2 weeks started 11/11/2018 switched to monthly phlebotomies 02/21/2019 switch to every 2 months 05/22/2019, switched to every 46-month starting 11/20/2019   Lab review:  07/31/22: Hemoglobin 15.2  11/02/2022: Hemoglobin 15.3   Patient works as a Lawyer: Every 2 months phlebotomies at [facility].    He has made replacement surgery) in December.  We will hold off on phlebotomy for that month. Return to clinic in 6 months for follow-up with labs

## 2022-11-12 NOTE — Patient Instructions (Addendum)
SURGICAL WAITING ROOM VISITATION Patients having surgery or a procedure may have no more than 2 support people in the waiting area - these visitors may rotate.   Children under the age of 17 must have an adult with them who is not the patient. If the patient needs to stay at the hospital during part of their recovery, the visitor guidelines for inpatient rooms apply. Pre-op nurse will coordinate an appropriate time for 1 support person to accompany patient in pre-op.  This support person may not rotate.    Please refer to the [facility] website for the visitor guidelines for Inpatients (after your surgery is over and you are in a regular room).      Your procedure is scheduled on: 11-23-22   Report to [facility] Main Entrance    Report to admitting at 10:30 AM   Call this number if you have problems the morning of surgery [phone]   Do not eat food :After Midnight.   After Midnight you may have the following liquids until 10:00 AM DAY OF SURGERY  Water Non-Citrus Juices (without pulp, NO RED) Carbonated Beverages Black Coffee (NO MILK/CREAM OR CREAMERS, sugar ok)  Clear Tea (NO MILK/CREAM OR CREAMERS, sugar ok) regular and decaf                             Plain Jell-O (NO RED)                                           Fruit ices (not with fruit pulp, NO RED)                                     Popsicles (NO RED)                                                               Sports drinks like Gatorade (NO RED)                   The day of surgery:  Drink ONE (1) Pre-Surgery Clear Ensure at 10:00 AM the morning of surgery. Drink in one sitting. Do not sip.  This drink was given to you during your hospital  pre-op appointment visit. Nothing else to drink after completing the Pre-Surgery Clear Ensure.          If you have questions, please contact your surgeon's office.   FOLLOW  ANY ADDITIONAL PRE OP INSTRUCTIONS YOU RECEIVED FROM YOUR SURGEON'S OFFICE!!!      Oral Hygiene is also important to reduce your risk of infection.                                    Remember - BRUSH YOUR TEETH THE MORNING OF SURGERY WITH YOUR REGULAR TOOTHPASTE   Do NOT smoke after Midnight   Take these medicines the morning of surgery with A SIP OF WATER:   Silodosin  You may not have any metal on your body including  jewelry, and body piercing             Do not wear lotions, powders, cologne, or deodorant              Men may shave face and neck.   Do not bring valuables to the hospital. [facility] IS NOT RESPONSIBLE   FOR VALUABLES.   Contacts, dentures or bridgework may not be worn into surgery.   Bring small overnight bag day of surgery.   DO NOT BRING YOUR HOME MEDICATIONS TO THE HOSPITAL. PHARMACY WILL DISPENSE MEDICATIONS LISTED ON YOUR MEDICATION LIST TO YOU DURING YOUR ADMISSION IN THE HOSPITAL!   Special Instructions: Bring a copy of your healthcare power of attorney and living will documents the day of surgery if you haven't scanned them before.              Please read over the following fact sheets you were given: IF YOU HAVE QUESTIONS ABOUT YOUR PRE-OP INSTRUCTIONS PLEASE CALL [phone] Sergio [name]  If you received a COVID test during your pre-op visit  it is requested that you wear a mask when out in public, stay away from anyone that may not be feeling well and notify your surgeon if you develop symptoms. If you test positive for Covid or have been in contact with anyone that has tested positive in the last 10 days please notify you surgeon.  [place] - Preparing for Surgery Before surgery, you can play an important role.  Because skin is not sterile, your skin needs to be as free of germs as possible.  You can reduce the number of germs on your skin by washing with CHG (chlorahexidine gluconate) soap before surgery.  CHG is an antiseptic cleaner which kills germs and bonds with the skin to continue killing germs even  after washing. Please DO NOT use if you have an allergy to CHG or antibacterial soaps.  If your skin becomes reddened/irritated stop using the CHG and inform your nurse when you arrive at Short Stay. Do not shave (including legs and underarms) for at least 48 hours prior to the first CHG shower.  You may shave your face/neck.  Please follow these instructions carefully:  1.  Shower with CHG Soap the night before surgery and the  morning of surgery.  2.  If you choose to wash your hair, wash your hair first as usual with your normal  shampoo.  3.  After you shampoo, rinse your hair and body thoroughly to remove the shampoo.                             4.  Use CHG as you would any other liquid soap.  You can apply chg directly to the skin and wash.  Gently with a scrungie or clean washcloth.  5.  Apply the CHG Soap to your body ONLY FROM THE NECK DOWN.   Do   not use on face/ open                           Wound or open sores. Avoid contact with eyes, ears mouth and   genitals (private parts).                       Wash face,  Genitals (private parts) with your normal soap.  6.  Wash thoroughly, paying special attention to the area where your    surgery  will be performed.  7.  Thoroughly rinse your body with warm water from the neck down.  8.  DO NOT shower/wash with your normal soap after using and rinsing off the CHG Soap.                9.  Pat yourself dry with a clean towel.            10.  Wear clean pajamas.            11.  Place clean sheets on your bed the night of your first shower and do not  sleep with pets. Day of Surgery : Do not apply any lotions/deodorants the morning of surgery.  Please wear clean clothes to the hospital/surgery center.  FAILURE TO FOLLOW THESE INSTRUCTIONS MAY RESULT IN THE CANCELLATION OF YOUR SURGERY  PATIENT SIGNATURE_________________________________  NURSE  SIGNATURE__________________________________  ________________________________________________________________________    Sergio [name]  An incentive spirometer is a tool that can help keep your lungs clear and active. This tool measures how well you are filling your lungs with each breath. Taking long deep breaths may help reverse or decrease the chance of developing breathing (pulmonary) problems (especially infection) following: A long period of time when you are unable to move or be active. BEFORE THE PROCEDURE  If the spirometer includes an indicator to show your best effort, your nurse or respiratory therapist will set it to a desired goal. If possible, sit up straight or lean slightly forward. Try not to slouch. Hold the incentive spirometer in an upright position. INSTRUCTIONS FOR USE  Sit on the edge of your bed if possible, or sit up as far as you can in bed or on a chair. Hold the incentive spirometer in an upright position. Breathe out normally. Place the mouthpiece in your mouth and seal your lips tightly around it. Breathe in slowly and as deeply as possible, raising the piston or the ball toward the top of the column. Hold your breath for 3-5 seconds or for as long as possible. Allow the piston or ball to fall to the bottom of the column. Remove the mouthpiece from your mouth and breathe out normally. Rest for a few seconds and repeat Steps 1 through 7 at least 10 times every 1-2 hours when you are awake. Take your time and take a few normal breaths between deep breaths. The spirometer may include an indicator to show your best effort. Use the indicator as a goal to work toward during each repetition. After each set of 10 deep breaths, practice coughing to be sure your lungs are clear. If you have an incision (the cut made at the time of surgery), support your incision when coughing by placing a pillow or rolled up towels firmly against it. Once you are able to get out of  bed, walk around indoors and cough well. You may stop using the incentive spirometer when instructed by your caregiver.  RISKS AND COMPLICATIONS Take your time so you do not get dizzy or light-headed. If you are in pain, you may need to take or ask for pain medication before doing incentive spirometry. It is harder to take a deep breath if you are having pain. AFTER USE Rest and breathe slowly and easily. It can be helpful to keep track of a log of your progress. Your caregiver can provide you with a simple table to help with  this. If you are using the spirometer at home, follow these instructions: [place] IF:  You are having difficultly using the spirometer. You have trouble using the spirometer as often as instructed. Your pain medication is not giving enough relief while using the spirometer. You develop fever of 100.5 F (38.1 C) or higher. SEEK IMMEDIATE MEDICAL CARE IF:  You cough up bloody sputum that had not been present before. You develop fever of 102 F (38.9 C) or greater. You develop worsening pain at or near the incision site. MAKE SURE YOU:  Understand these instructions. Will watch your condition. Will get help right away if you are not doing well or get worse. Document Released: 04/05/2007 Document Revised: 02/15/2012 Document Reviewed: 06/06/2007 [facility] Patient Information 2014 [place], [place].   ________________________________________________________________________

## 2022-11-12 NOTE — Progress Notes (Addendum)
COVID Vaccine Completed:  Yes  Date of COVID positive in last 90 days:  No  PCP - Richarda Blade, NP Cardiologist - Clotilde Dieter, DO  Chest x-ray - N/A EKG - 10-06-22 Epic Stress Test - 10-20-22 Epic ECHO - 10-20-22 Epic Cardiac Cath -  N/A Pacemaker/ICD device last checked: Spinal Cord Stimulator:  N/A  Bowel Prep - N/A  Sleep Study - N/A CPAP -   Fasting Blood Sugar - N/A Checks Blood Sugar _____ times a day  Last dose of GLP1 agonist-  N/A GLP1 instructions:  N/A   Last dose of SGLT-2 inhibitors-  N/A SGLT-2 instructions: N/A  Blood Thinner Instructions: Aspirin Instructions:  ASA 81.  Last Dose:  11-16-22  Activity level:  Can go up a flight of stairs and perform activities of daily living without stopping and without symptoms of chest pain or shortness of breath.  Anesthesia review:  Abnormal EKG evaluated by cardiology  Patient denies shortness of breath, fever, cough and chest pain at PAT appointment  Patient verbalized understanding of instructions that were given to them at the PAT appointment. Patient was also instructed that they will need to review over the PAT instructions again at home before surgery.

## 2022-11-17 ENCOUNTER — Other Ambulatory Visit: Payer: Self-pay

## 2022-11-17 ENCOUNTER — Encounter (HOSPITAL_COMMUNITY)
Admission: RE | Admit: 2022-11-17 | Discharge: 2022-11-17 | Disposition: A | Discharge status: Home / Self Care | Payer: Medicare HMO | Source: Ambulatory Visit | Attending: Orthopedic Surgery | Admitting: Orthopedic Surgery

## 2022-11-17 ENCOUNTER — Encounter (HOSPITAL_COMMUNITY): Payer: Self-pay

## 2022-11-17 VITALS — BP 130/95 | HR 67 | Temp 98.2°F | Resp 12 | Ht 68.0 in | Wt 170.2 lb

## 2022-11-17 DIAGNOSIS — Z01812 Encounter for preprocedural laboratory examination: Secondary | ICD-10-CM | POA: Insufficient documentation

## 2022-11-17 DIAGNOSIS — I2510 Atherosclerotic heart disease of native coronary artery without angina pectoris: Secondary | ICD-10-CM | POA: Insufficient documentation

## 2022-11-17 DIAGNOSIS — M1711 Unilateral primary osteoarthritis, right knee: Secondary | ICD-10-CM | POA: Insufficient documentation

## 2022-11-17 DIAGNOSIS — Z01818 Encounter for other preprocedural examination: Secondary | ICD-10-CM

## 2022-11-17 DIAGNOSIS — I10 Essential (primary) hypertension: Secondary | ICD-10-CM | POA: Insufficient documentation

## 2022-11-17 HISTORY — DX: Pneumonia, unspecified organism: J18.9

## 2022-11-17 HISTORY — DX: Unspecified osteoarthritis, unspecified site: M19.90

## 2022-11-17 LAB — BASIC METABOLIC PANEL
Anion gap: 8 (ref 5–15)
BUN: 12 mg/dL (ref 8–23)
CO2: 24 mmol/L (ref 22–32)
Calcium: 9.3 mg/dL (ref 8.9–10.3)
Chloride: 99 mmol/L (ref 98–111)
Creatinine, Ser: 0.83 mg/dL (ref 0.61–1.24)
GFR, Estimated: >60 mL/min (ref >60)
Glucose, Bld: 92 mg/dL (ref 70–99)
Potassium: 3.8 mmol/L (ref 3.5–5.1)
Sodium: 131 mmol/L — ABNORMAL LOW (ref 135–145)

## 2022-11-17 LAB — SURGICAL PCR SCREEN
MRSA, PCR: NEGATIVE
Staphylococcus aureus: NEGATIVE

## 2022-11-18 ENCOUNTER — Other Ambulatory Visit: Payer: Medicare HMO

## 2022-11-18 ENCOUNTER — Ambulatory Visit: Payer: Medicare HMO | Admitting: Hematology and Oncology

## 2022-11-18 NOTE — Progress Notes (Signed)
Anesthesia Chart Review   Case: [id number] Date/Time: 11/23/22 1249   Procedure: TOTAL KNEE ARTHROPLASTY (Right: Knee)   Anesthesia type: Spinal   Pre-op diagnosis: OA RIGHT KNEE   Location: [facility] ROOM 08 / [facility]   Surgeons: Joen Laura, MD       DISCUSSION:71 y.o. never smoker with h/o HTN, right knee OA scheduled for above procedure 11/23/2022 with Dr. Weber Cooks.   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  Followed by hematology, last seen 11/02/2022.   Pt seen by cardiology 10/06/2022 for preoperative evaluation. Stress test 10/20/2022 low risk study.  Echo 10/20/2022 with normal LV systolic function with EF 55%, grade I diastolic dysfunction, mild tricuspid regurgitation, no evidence of pulmonary HTN.   Anticipate pt can proceed with planned procedure barring acute status change.   VS: BP (!) 130/95   Pulse 67   Temp 36.8 C (Oral)   Resp 12   Ht 5\' 8"  (1.727 m)   Wt 77.2 kg   SpO2 99%   BMI 25.88 kg/m   PROVIDERS: Ngetich, , NP is PCP   Cardiologist - Donalee Citrin, DO  LABS: Labs reviewed: Acceptable for surgery. (all labs ordered are listed, but only abnormal results are displayed)  Labs Reviewed  BASIC METABOLIC PANEL - Abnormal; Notable for the following components:      Result Value   Sodium 131 (*)    All other components within normal limits  SURGICAL PCR SCREEN     IMAGES:   EKG:   CV: Lexiscan (with Mod Bruce protocol) Nuclear stress test 10/20/2022 Myocardial perfusion is normal. Overall LV systolic function is normal without regional wall motion abnormalities. Stress LV EF: 58%. Low risk study. Nondiagnostic ECG stress. The heart rate response was consistent with Regadenoson. The blood pressure response was physiologic. No previous exam available for comparison.   Echocardiogram 10/20/2022: Left ventricle cavity is normal in size. Mild  concentric hypertrophy of the left ventricle. Normal global wall motion. Normal LV systolic function with EF 55%. Doppler evidence of grade I (impaired) diastolic dysfunction, normal LAP. Mild tricuspid regurgitation. No evidence of pulmonary hypertension.   Past Medical History:  Diagnosis Date   Arthritis    Enlarged prostate    Erectile dysfunction    History of colonoscopy    History of COVID-19    Hypertension    Low testosterone    Low testosterone    Pneumonia    2022    Past Surgical History:  Procedure Laterality Date   COLONOSCOPY     RETINAL DETACHMENT SURGERY Right    WISDOM TOOTH EXTRACTION      MEDICATIONS:  aspirin EC 81 MG tablet   hydrochlorothiazide (HYDRODIURIL) 25 MG tablet   lisinopril (ZESTRIL) 20 MG tablet   Multiple Vitamin (MULTIVITAMIN) tablet   sildenafil (REVATIO) 20 MG tablet   silodosin (RAPAFLO) 8 MG CAPS capsule   Testosterone Cypionate 100 MG/ML SOLN   No current facility-administered medications for this encounter.    2023 Ward, PA-C [facility] Pre-Surgical Testing [phone]

## 2022-11-19 ENCOUNTER — Other Ambulatory Visit: Payer: Medicare HMO

## 2022-11-19 ENCOUNTER — Ambulatory Visit: Payer: Medicare HMO | Admitting: Hematology and Oncology

## 2022-11-23 ENCOUNTER — Observation Stay (HOSPITAL_COMMUNITY): Payer: Medicare HMO

## 2022-11-23 ENCOUNTER — Encounter (HOSPITAL_COMMUNITY)
Admission: RE | Disposition: A | Discharge status: Home / Self Care | Payer: Self-pay | Source: Home / Self Care | Attending: Orthopedic Surgery

## 2022-11-23 ENCOUNTER — Observation Stay (HOSPITAL_COMMUNITY)
Admission: RE | Admit: 2022-11-23 | Discharge: 2022-11-24 | Disposition: A | Discharge status: Home / Self Care | Payer: Medicare HMO | Attending: Orthopedic Surgery | Admitting: Orthopedic Surgery

## 2022-11-23 ENCOUNTER — Ambulatory Visit (HOSPITAL_COMMUNITY): Payer: Medicare HMO | Admitting: Physician Assistant

## 2022-11-23 ENCOUNTER — Ambulatory Visit (HOSPITAL_BASED_OUTPATIENT_CLINIC_OR_DEPARTMENT_OTHER): Payer: Medicare HMO | Admitting: Anesthesiology

## 2022-11-23 ENCOUNTER — Other Ambulatory Visit: Payer: Self-pay

## 2022-11-23 ENCOUNTER — Encounter (HOSPITAL_COMMUNITY): Payer: Self-pay | Admitting: Orthopedic Surgery

## 2022-11-23 DIAGNOSIS — Z79899 Other long term (current) drug therapy: Secondary | ICD-10-CM | POA: Insufficient documentation

## 2022-11-23 DIAGNOSIS — Z8616 Personal history of COVID-19: Secondary | ICD-10-CM | POA: Diagnosis not present

## 2022-11-23 DIAGNOSIS — Z7982 Long term (current) use of aspirin: Secondary | ICD-10-CM | POA: Diagnosis not present

## 2022-11-23 DIAGNOSIS — I10 Essential (primary) hypertension: Secondary | ICD-10-CM | POA: Diagnosis not present

## 2022-11-23 DIAGNOSIS — M1711 Unilateral primary osteoarthritis, right knee: Principal | ICD-10-CM | POA: Diagnosis present

## 2022-11-23 HISTORY — PX: TOTAL KNEE ARTHROPLASTY: SHX125

## 2022-11-23 SURGERY — ARTHROPLASTY, KNEE, TOTAL
Anesthesia: Spinal | Site: Knee | Laterality: Right

## 2022-11-23 MED ORDER — BUPIVACAINE LIPOSOME 1.3 % IJ SUSP
INTRAMUSCULAR | Status: DC | PRN
  Administered 2022-11-23: 20 mL

## 2022-11-23 MED ORDER — LACTATED RINGERS IV SOLN: INTRAVENOUS | Status: DC

## 2022-11-23 MED ORDER — FENTANYL CITRATE PF 50 MCG/ML IJ SOSY
100.0000 ug | PREFILLED_SYRINGE | INTRAMUSCULAR | Status: DC
  Administered 2022-11-23: 50 ug via INTRAVENOUS

## 2022-11-23 MED ORDER — CEFAZOLIN SODIUM-DEXTROSE 2-4 GM/100ML-% IV SOLN
2.0000 g | INTRAVENOUS | Status: AC
  Administered 2022-11-23: 2 g via INTRAVENOUS
  Filled 2022-11-23: qty 100

## 2022-11-23 MED ORDER — DEXAMETHASONE SODIUM PHOSPHATE 10 MG/ML IJ SOLN
8.0000 mg | Freq: Once | INTRAMUSCULAR | Status: AC
  Administered 2022-11-23: 10 mg via INTRAVENOUS

## 2022-11-23 MED ORDER — SODIUM CHLORIDE (PF) 0.9 % IJ SOLN
INTRAMUSCULAR | Status: AC
  Filled 2022-11-23: qty 50

## 2022-11-23 MED ORDER — PROPOFOL 10 MG/ML IV BOLUS
INTRAVENOUS | Status: DC | PRN
  Administered 2022-11-23: 30 mg via INTRAVENOUS
  Administered 2022-11-23: 50 mg via INTRAVENOUS

## 2022-11-23 MED ORDER — ONDANSETRON HCL 4 MG/2ML IJ SOLN
INTRAMUSCULAR | Status: DC | PRN
  Administered 2022-11-23: 4 mg via INTRAVENOUS

## 2022-11-23 MED ORDER — POVIDONE-IODINE 10 % EX SWAB: 2.0000 | Freq: Once | CUTANEOUS | Status: DC

## 2022-11-23 MED ORDER — TAMSULOSIN HCL 0.4 MG PO CAPS
0.4000 mg | ORAL_CAPSULE | Freq: Every day | ORAL | Status: DC
  Administered 2022-11-24: 0.4 mg via ORAL
  Filled 2022-11-23: qty 1

## 2022-11-23 MED ORDER — SODIUM CHLORIDE (PF) 0.9 % IJ SOLN
INTRAMUSCULAR | Status: AC
  Filled 2022-11-23: qty 10

## 2022-11-23 MED ORDER — METHOCARBAMOL 500 MG IVPB - SIMPLE MED: 500.0000 mg | Freq: Four times a day (QID) | INTRAVENOUS | Status: DC | PRN

## 2022-11-23 MED ORDER — ONDANSETRON HCL 4 MG/2ML IJ SOLN: 4.0000 mg | Freq: Four times a day (QID) | INTRAMUSCULAR | Status: DC | PRN

## 2022-11-23 MED ORDER — PROPOFOL 500 MG/50ML IV EMUL
INTRAVENOUS | Status: DC | PRN
  Administered 2022-11-23: 50 ug/kg/min via INTRAVENOUS

## 2022-11-23 MED ORDER — HYDROCHLOROTHIAZIDE 25 MG PO TABS
25.0000 mg | ORAL_TABLET | Freq: Every day | ORAL | Status: DC
  Administered 2022-11-24: 25 mg via ORAL
  Filled 2022-11-23: qty 1

## 2022-11-23 MED ORDER — ORAL CARE MOUTH RINSE: 15.0000 mL | Freq: Once | OROMUCOSAL | Status: AC

## 2022-11-23 MED ORDER — DEXAMETHASONE SODIUM PHOSPHATE 10 MG/ML IJ SOLN
INTRAMUSCULAR | Status: DC | PRN
  Administered 2022-11-23: 5 mg

## 2022-11-23 MED ORDER — ONDANSETRON HCL 4 MG PO TABS: 4.0000 mg | ORAL_TABLET | Freq: Four times a day (QID) | ORAL | Status: DC | PRN

## 2022-11-23 MED ORDER — DOCUSATE SODIUM 100 MG PO CAPS
100.0000 mg | ORAL_CAPSULE | Freq: Two times a day (BID) | ORAL | Status: DC
  Administered 2022-11-23 – 2022-11-24 (×2): 100 mg via ORAL
  Filled 2022-11-23 (×2): qty 1

## 2022-11-23 MED ORDER — ASPIRIN 81 MG PO CHEW
81.0000 mg | CHEWABLE_TABLET | Freq: Two times a day (BID) | ORAL | Status: DC
  Administered 2022-11-23 – 2022-11-24 (×2): 81 mg via ORAL
  Filled 2022-11-23 (×2): qty 1

## 2022-11-23 MED ORDER — MENTHOL 3 MG MT LOZG: 1.0000 | LOZENGE | OROMUCOSAL | Status: DC | PRN

## 2022-11-23 MED ORDER — ACETAMINOPHEN 500 MG PO TABS
1000.0000 mg | ORAL_TABLET | Freq: Four times a day (QID) | ORAL | Status: AC
  Administered 2022-11-23 – 2022-11-24 (×4): 1000 mg via ORAL
  Filled 2022-11-23 (×4): qty 2

## 2022-11-23 MED ORDER — CEFAZOLIN SODIUM-DEXTROSE 2-4 GM/100ML-% IV SOLN
2.0000 g | Freq: Four times a day (QID) | INTRAVENOUS | Status: AC
  Administered 2022-11-23 – 2022-11-24 (×2): 2 g via INTRAVENOUS
  Filled 2022-11-23 (×2): qty 100

## 2022-11-23 MED ORDER — FENTANYL CITRATE PF 50 MCG/ML IJ SOSY: 25.0000 ug | PREFILLED_SYRINGE | INTRAMUSCULAR | Status: DC | PRN

## 2022-11-23 MED ORDER — ACETAMINOPHEN 500 MG PO TABS
1000.0000 mg | ORAL_TABLET | Freq: Once | ORAL | Status: DC
  Filled 2022-11-23: qty 2

## 2022-11-23 MED ORDER — CELECOXIB 200 MG PO CAPS
ORAL_CAPSULE | ORAL | Status: AC
  Filled 2022-11-23: qty 1

## 2022-11-23 MED ORDER — LISINOPRIL 20 MG PO TABS
20.0000 mg | ORAL_TABLET | Freq: Every day | ORAL | Status: DC
  Administered 2022-11-24: 20 mg via ORAL
  Filled 2022-11-23: qty 1

## 2022-11-23 MED ORDER — ACETAMINOPHEN 325 MG PO TABS: 325.0000 mg | ORAL_TABLET | Freq: Four times a day (QID) | ORAL | Status: DC | PRN

## 2022-11-23 MED ORDER — SODIUM CHLORIDE 0.9 % IR SOLN
Status: DC | PRN
  Administered 2022-11-23: 2000 mL

## 2022-11-23 MED ORDER — DIPHENHYDRAMINE HCL 12.5 MG/5ML PO ELIX: 12.5000 mg | ORAL_SOLUTION | ORAL | Status: DC | PRN

## 2022-11-23 MED ORDER — HYDROMORPHONE HCL 1 MG/ML IJ SOLN: 0.5000 mg | INTRAMUSCULAR | Status: DC | PRN

## 2022-11-23 MED ORDER — ACETAMINOPHEN 500 MG PO TABS
1000.0000 mg | ORAL_TABLET | Freq: Once | ORAL | Status: AC
  Administered 2022-11-23: 1000 mg via ORAL

## 2022-11-23 MED ORDER — KETOROLAC TROMETHAMINE 15 MG/ML IJ SOLN
7.5000 mg | Freq: Four times a day (QID) | INTRAMUSCULAR | Status: DC
  Administered 2022-11-23 – 2022-11-24 (×3): 7.5 mg via INTRAVENOUS
  Filled 2022-11-23 (×4): qty 1

## 2022-11-23 MED ORDER — ADULT MULTIVITAMIN W/MINERALS CH
1.0000 | ORAL_TABLET | Freq: Every day | ORAL | Status: DC
  Administered 2022-11-24: 1 via ORAL
  Filled 2022-11-23: qty 1

## 2022-11-23 MED ORDER — SODIUM CHLORIDE 0.9% FLUSH
INTRAVENOUS | Status: DC | PRN
  Administered 2022-11-23: 60 mL

## 2022-11-23 MED ORDER — POLYETHYLENE GLYCOL 3350 17 G PO PACK: 17.0000 g | PACK | Freq: Every day | ORAL | Status: DC | PRN

## 2022-11-23 MED ORDER — PANTOPRAZOLE SODIUM 40 MG PO TBEC
40.0000 mg | DELAYED_RELEASE_TABLET | Freq: Every day | ORAL | Status: DC
  Administered 2022-11-23 – 2022-11-24 (×2): 40 mg via ORAL
  Filled 2022-11-23 (×2): qty 1

## 2022-11-23 MED ORDER — MIDAZOLAM HCL 2 MG/2ML IJ SOLN
2.0000 mg | INTRAMUSCULAR | Status: DC
  Administered 2022-11-23: 2 mg via INTRAVENOUS

## 2022-11-23 MED ORDER — OXYCODONE HCL 5 MG PO TABS
5.0000 mg | ORAL_TABLET | ORAL | Status: DC | PRN
  Administered 2022-11-23 – 2022-11-24 (×2): 5 mg via ORAL
  Filled 2022-11-23: qty 2
  Filled 2022-11-23: qty 1

## 2022-11-23 MED ORDER — BUPIVACAINE LIPOSOME 1.3 % IJ SUSP: 20.0000 mL | Freq: Once | INTRAMUSCULAR | Status: DC

## 2022-11-23 MED ORDER — PHENYLEPHRINE 80 MCG/ML (10ML) SYRINGE FOR IV PUSH (FOR BLOOD PRESSURE SUPPORT)
PREFILLED_SYRINGE | INTRAVENOUS | Status: DC | PRN
  Administered 2022-11-23 (×6): 80 ug via INTRAVENOUS
  Administered 2022-11-23: 120 ug via INTRAVENOUS
  Administered 2022-11-23: 80 ug via INTRAVENOUS

## 2022-11-23 MED ORDER — CHLORHEXIDINE GLUCONATE 0.12 % MT SOLN
15.0000 mL | Freq: Once | OROMUCOSAL | Status: AC
  Administered 2022-11-23: 15 mL via OROMUCOSAL

## 2022-11-23 MED ORDER — METHOCARBAMOL 500 MG PO TABS
500.0000 mg | ORAL_TABLET | Freq: Four times a day (QID) | ORAL | Status: DC | PRN
  Administered 2022-11-24: 500 mg via ORAL
  Filled 2022-11-23: qty 1

## 2022-11-23 MED ORDER — BUPIVACAINE LIPOSOME 1.3 % IJ SUSP
INTRAMUSCULAR | Status: AC
  Filled 2022-11-23: qty 20

## 2022-11-23 MED ORDER — TRANEXAMIC ACID-NACL 1000-0.7 MG/100ML-% IV SOLN
1000.0000 mg | INTRAVENOUS | Status: AC
  Administered 2022-11-23: 1000 mg via INTRAVENOUS
  Filled 2022-11-23: qty 100

## 2022-11-23 MED ORDER — MIDAZOLAM HCL 2 MG/2ML IJ SOLN
INTRAMUSCULAR | Status: AC
  Filled 2022-11-23: qty 2

## 2022-11-23 MED ORDER — ROPIVACAINE HCL 5 MG/ML IJ SOLN
INTRAMUSCULAR | Status: DC | PRN
  Administered 2022-11-23: 30 mL via PERINEURAL

## 2022-11-23 MED ORDER — PHENOL 1.4 % MT LIQD: 1.0000 | OROMUCOSAL | Status: DC | PRN

## 2022-11-23 MED ORDER — ZOLPIDEM TARTRATE 5 MG PO TABS: 5.0000 mg | ORAL_TABLET | Freq: Every evening | ORAL | Status: DC | PRN

## 2022-11-23 MED ORDER — FENTANYL CITRATE PF 50 MCG/ML IJ SOSY
PREFILLED_SYRINGE | INTRAMUSCULAR | Status: AC
  Filled 2022-11-23: qty 2

## 2022-11-23 MED ORDER — 0.9 % SODIUM CHLORIDE (POUR BTL) OPTIME
TOPICAL | Status: DC | PRN
  Administered 2022-11-23: 1000 mL

## 2022-11-23 MED ORDER — CELECOXIB 200 MG PO CAPS
400.0000 mg | ORAL_CAPSULE | Freq: Once | ORAL | Status: AC
  Administered 2022-11-23: 400 mg via ORAL
  Filled 2022-11-23: qty 2

## 2022-11-23 MED ORDER — WATER FOR IRRIGATION, STERILE IR SOLN
Status: DC | PRN
  Administered 2022-11-23: 2000 mL

## 2022-11-23 SURGICAL SUPPLY — 66 items
ADH SKN CLS APL DERMABOND .7 (GAUZE/BANDAGES/DRESSINGS) ×1
APL PRP STRL LF DISP 70% ISPRP (MISCELLANEOUS) ×2
BAG COUNTER SPONGE SURGICOUNT (BAG) IMPLANT
BAG SPNG CNTER NS LX DISP (BAG)
BLADE SAG 18X100X1.27 (BLADE) ×2 IMPLANT
BLADE SAW SAG 35X64 .89 (BLADE) ×2 IMPLANT
BNDG CMPR 5X3 CHSV STRCH STRL (GAUZE/BANDAGES/DRESSINGS) ×1
BNDG CMPR MED 10X6 ELC LF (GAUZE/BANDAGES/DRESSINGS) ×1
BNDG COHESIVE 3X5 TAN ST LF (GAUZE/BANDAGES/DRESSINGS) ×2 IMPLANT
BNDG ELASTIC 6X10 VLCR STRL LF (GAUZE/BANDAGES/DRESSINGS) ×2 IMPLANT
BOWL SMART MIX CTS (DISPOSABLE) ×2 IMPLANT
BSPLAT TIB 5D F CMNT STM RT (Knees) ×1 IMPLANT
CEMENT BONE R 1X40 (Cement) IMPLANT
CEMENT BONE REFOBACIN R1X40 US (Cement) IMPLANT
CHLORAPREP W/TINT 26 (MISCELLANEOUS) ×4 IMPLANT
COMP FEM CMT PERSONA SZ7 RT (Joint) ×1 IMPLANT
COMPONENT FEM CMT PRSONA SZ7RT (Joint) IMPLANT
COVER SURGICAL LIGHT HANDLE (MISCELLANEOUS) ×2 IMPLANT
CUFF TOURN SGL QUICK 34 (TOURNIQUET CUFF) ×1
CUFF TRNQT CYL 34X4.125X (TOURNIQUET CUFF) ×2 IMPLANT
DERMABOND ADVANCED .7 DNX12 (GAUZE/BANDAGES/DRESSINGS) ×2 IMPLANT
DRAPE INCISE IOBAN 85X60 (DRAPES) ×2 IMPLANT
DRAPE SHEET LG 3/4 BI-LAMINATE (DRAPES) ×2 IMPLANT
DRAPE U-SHAPE 47X51 STRL (DRAPES) ×2 IMPLANT
DRESSING AQUACEL AG SP 3.5X10 (GAUZE/BANDAGES/DRESSINGS) ×2 IMPLANT
DRSG AQUACEL AG ADV 3.5X10 (GAUZE/BANDAGES/DRESSINGS) IMPLANT
DRSG AQUACEL AG SP 3.5X10 (GAUZE/BANDAGES/DRESSINGS) ×1
ELECT REM PT RETURN 15FT ADLT (MISCELLANEOUS) ×2 IMPLANT
GAUZE SPONGE 4X4 12PLY STRL (GAUZE/BANDAGES/DRESSINGS) ×2 IMPLANT
GLOVE BIO SURGEON STRL SZ 6.5 (GLOVE) ×4 IMPLANT
GLOVE BIOGEL PI IND STRL 6.5 (GLOVE) ×2 IMPLANT
GLOVE BIOGEL PI IND STRL 7.0 (GLOVE) IMPLANT
GLOVE BIOGEL PI IND STRL 8 (GLOVE) ×2 IMPLANT
GLOVE SURG ORTHO 8.0 STRL STRW (GLOVE) ×4 IMPLANT
GLOVE SURG SS PI 6.5 STRL IVOR (GLOVE) IMPLANT
GOWN STRL REUS W/ TWL XL LVL3 (GOWN DISPOSABLE) ×4 IMPLANT
GOWN STRL REUS W/TWL XL LVL3 (GOWN DISPOSABLE) ×1
HANDPIECE INTERPULSE COAX TIP (DISPOSABLE) ×1
HOLDER FOLEY CATH W/STRAP (MISCELLANEOUS) ×2 IMPLANT
HOOD PEEL AWAY T7 (MISCELLANEOUS) ×6 IMPLANT
INSERT TIB ARTISURF SZ6-7 R 11 (Joint) IMPLANT
MANIFOLD NEPTUNE II (INSTRUMENTS) ×2 IMPLANT
MARKER SKIN DUAL TIP RULER LAB (MISCELLANEOUS) ×2 IMPLANT
NS IRRIG 1000ML POUR BTL (IV SOLUTION) ×2 IMPLANT
PACK TOTAL KNEE CUSTOM (KITS) ×2 IMPLANT
PROTECTOR NERVE ULNAR (MISCELLANEOUS) ×2 IMPLANT
SCREW HEADED 33MM KNEE (MISCELLANEOUS) IMPLANT
SET HNDPC FAN SPRY TIP SCT (DISPOSABLE) ×2 IMPLANT
SOLUTION IRRIG SURGIPHOR (IV SOLUTION) IMPLANT
SPIKE FLUID TRANSFER (MISCELLANEOUS) ×2 IMPLANT
STEM POLY PAT PLY 35M KNEE (Knees) IMPLANT
STEM TIBIA 5 DEG SZ F R KNEE (Knees) IMPLANT
STRIP CLOSURE SKIN 1/2X4 (GAUZE/BANDAGES/DRESSINGS) ×2 IMPLANT
SUT MNCRL AB 3-0 PS2 18 (SUTURE) ×2 IMPLANT
SUT STRATAFIX 0 PDS 27 VIOLET (SUTURE) ×1
SUT STRATAFIX PDO 1 14 VIOLET (SUTURE) ×1
SUT STRATFX PDO 1 14 VIOLET (SUTURE) ×1
SUT VIC AB 0 CT1 36 (SUTURE) IMPLANT
SUT VIC AB 2-0 CT2 27 (SUTURE) ×4 IMPLANT
SUTURE STRATFX 0 PDS 27 VIOLET (SUTURE) ×2 IMPLANT
SUTURE STRATFX PDO 1 14 VIOLET (SUTURE) ×2 IMPLANT
SYR 50ML LL SCALE MARK (SYRINGE) ×2 IMPLANT
TIBIA STEM 5 DEG SZ F R KNEE (Knees) ×1 IMPLANT
TRAY FOLEY MTR SLVR 14FR STAT (SET/KITS/TRAYS/PACK) IMPLANT
TUBE SUCTION HIGH CAP CLEAR NV (SUCTIONS) ×2 IMPLANT
WRAP KNEE MAXI GEL POST OP (GAUZE/BANDAGES/DRESSINGS) IMPLANT

## 2022-11-23 NOTE — Anesthesia Procedure Notes (Signed)
Spinal  Patient location during procedure: OR Start time: 11/23/2022 1:30 PM End time: 11/23/2022 1:35 PM Reason for block: surgical anesthesia Staffing Performed: anesthesiologist  Anesthesiologist: Elmer Picker, MD Performed by: Elmer Picker, MD Authorized by: Elmer Picker, MD   Preanesthetic Checklist Completed: patient identified, IV checked, risks and benefits discussed, surgical consent, monitors and equipment checked, pre-op evaluation and timeout performed Spinal Block Patient position: sitting Prep: DuraPrep and site prepped and draped Patient monitoring: cardiac monitor, continuous pulse ox and blood pressure Approach: midline Location: L3-4 Injection technique: single-shot Needle Needle type: Pencan  Needle gauge: 24 G Needle length: 9 cm Assessment Sensory level: T6 Events: CSF return Additional Notes Functioning IV was confirmed and monitors were applied. Sterile prep and drape, including hand hygiene and sterile gloves were used. The patient was positioned and the spine was prepped. The skin was anesthetized with lidocaine.  Free flow of clear CSF was obtained prior to injecting local anesthetic into the CSF.  The spinal needle aspirated freely following injection.  The needle was carefully withdrawn.  The patient tolerated the procedure well.

## 2022-11-23 NOTE — Anesthesia Preprocedure Evaluation (Addendum)
Anesthesia Evaluation  Patient identified by MRN, date of birth, ID band Patient awake    Reviewed: Allergy & Precautions, NPO status , Patient's Chart, lab work & pertinent test results  Airway Mallampati: II  TM Distance: >3 FB Neck ROM: Full    Dental no notable dental hx. (+) Teeth Intact, Dental Advisory Given   Pulmonary neg pulmonary ROS   Pulmonary exam normal breath sounds clear to auscultation       Cardiovascular hypertension, Pt. on medications Normal cardiovascular exam Rhythm:Regular Rate:Normal     Neuro/Psych negative neurological ROS  negative psych ROS   GI/Hepatic negative GI ROS, Neg liver ROS,,,  Endo/Other  negative endocrine ROS    Renal/GU negative Renal ROS  negative genitourinary   Musculoskeletal  (+) Arthritis ,    Abdominal   Peds  Hematology negative hematology ROS (+)   Anesthesia Other Findings   Reproductive/Obstetrics                             Anesthesia Physical Anesthesia Plan  ASA: 2  Anesthesia Plan: Spinal   Post-op Pain Management: Tylenol PO (pre-op)*   Induction:   PONV Risk Score and Plan: 1 and Treatment may vary due to age or medical condition, Ondansetron, Dexamethasone and Propofol infusion  Airway Management Planned: Natural Airway  Additional Equipment:   Intra-op Plan:   Post-operative Plan:   Informed Consent: I have reviewed the patients History and Physical, chart, labs and discussed the procedure including the risks, benefits and alternatives for the proposed anesthesia with the patient or authorized representative who has indicated his/her understanding and acceptance.     Dental advisory given  Plan Discussed with: CRNA  Anesthesia Plan Comments:        Anesthesia Quick Evaluation

## 2022-11-23 NOTE — Discharge Instructions (Signed)

## 2022-11-23 NOTE — Anesthesia Procedure Notes (Signed)
Anesthesia Regional Block: Adductor canal block   Pre-Anesthetic Checklist: , timeout performed,  Correct Patient, Correct Site, Correct Laterality,  Correct Procedure, Correct Position, site marked,  Risks and benefits discussed,  Pre-op evaluation,  At surgeon's request and post-op pain management  Laterality: Right  Prep: Maximum Sterile Barrier Precautions used, chloraprep       Needles:  Injection technique: Single-shot  Needle Type: Echogenic Stimulator Needle     Needle Length: 9cm  Needle Gauge: 21     Additional Needles:   Procedures:,,,, ultrasound used (permanent image in chart),,    Narrative:  Start time: 11/23/2022 12:50 PM End time: 11/23/2022 12:53 PM Injection made incrementally with aspirations every 5 mL. Anesthesiologist: Elmer Picker, MD

## 2022-11-23 NOTE — Transfer of Care (Signed)
Immediate Anesthesia Transfer of Care Note  Patient: Sergio [name]  Procedure(s) Performed: Procedure(s): TOTAL KNEE ARTHROPLASTY (Right)  Patient Location: PACU  Anesthesia Type:Spinal  Level of Consciousness: awake, alert  and oriented  Airway & Oxygen Therapy: Patient Spontanous Breathing  Post-op Assessment: Report given to RN and Post -op Vital signs reviewed and stable  Post vital signs: Reviewed and stable  Last Vitals:  Vitals:   11/23/22 1255 11/23/22 1542  BP: (!) 132/97 127/79  Pulse: 70 63  Resp: 14 12  Temp:  (!) 35.9 C  SpO2: 92% 96%    Complications: No apparent anesthesia complications

## 2022-11-23 NOTE — Op Note (Signed)
DATE OF SURGERY:  11/23/2022 TIME: 3:16 PM  PATIENT NAME:  Sergio [name]   AGE: 71 y.o.    PRE-OPERATIVE DIAGNOSIS: End-stage right knee osteoarthritis  POST-OPERATIVE DIAGNOSIS:  Same  PROCEDURE: Right total Knee Arthroplasty  SURGEON:  Joen Laura, MD   ASSISTANT: April Green, RNFA, present and scrubbed throughout the case, critical for assistance with exposure, retraction, instrumentation, and closure.   OPERATIVE IMPLANTS:  Cemented Zimmer persona size 7 CR femur, F tibia baseplate, 35 mm patella, 11 mm MC polyethylene liner Implant Name Type Inv. Item Serial No. Manufacturer Lot No. LRB No. Used Action  CEMENT BONE R 1X40 - [id number] Cement CEMENT BONE R 1X40  [employer]MER [employer](ORTH,TRAU,BIO,SG) [id number] Right 1 Implanted  CEMENT BONE R 1X40 - [id number] Cement CEMENT BONE R 1X40  [employer](ORTH,TRAU,BIO,SG) [id number] Right 1 Implanted  COMP FEM CMT PERSONA SZ7 RT - [id number] Joint COMP FEM CMT PERSONA SZ7 RT  [employer]ON(ORTH,TRAU,BIO,SG) [id number] Right 1 Implanted  TIBIA STEM 5 DEG SZ F R KNEE - [id number] Knees TIBIA STEM 5 DEG SZ F R KNEE  [employer]IMMER [employer]ON(ORTH,TRAU,BIO,SG) [id number] Right 1 Implanted  STEM POLY PAT PLY 34M KNEE - [id number] Knees STEM POLY PAT PLY 34M KNEE  ZIMMER [employer]ON(ORTH,TRAU,BIO,SG) [id number] Right 1 Implanted  INSERT TIB ARTISURF SZ6-7 R 11 - [id number] Joint INSERT TIB ARTISURF SZ6-7 R 11  ZIMMER [employer]ON(ORTH,TRAU,BIO,SG) [id number] Right 1 Implanted      PREOPERATIVE INDICATIONS:  Sergio [name] is a 71 y.o. year old male with end stage bone on bone degenerative arthritis of the knee who failed conservative treatment, including injections, antiinflammatories, activity modification, and assistive devices, and had significant impairment of their activities of daily living, and elected for Total Knee Arthroplasty.   The risks, benefits, and alternatives were discussed at length including but not limited to the risks of infection, bleeding,  nerve injury, stiffness, blood clots, the need for revision surgery, cardiopulmonary complications, among others, and they were willing to proceed.   ESTIMATED BLOOD LOSS: 25cc  OPERATIVE DESCRIPTION:   Once adequate anesthesia was induced, preoperative antibiotics, 2 gm of ancef,1 gm of Tranexamic Acid, and 8 mg of Decadron administered, the patient was positioned supine with a right thigh tourniquet placed.  The right lower extremity was prepped and draped in sterile fashion.  A time-  out was performed identifying the patient, planned procedure, and the appropriate extremity.     The leg was  exsanguinated, tourniquet elevated to 250 mmHg.  A midline incision was  made followed by median parapatellar arthrotomy. Anterior horn of the medial meniscus was released and resected. A medial release was performed, the infrapatellar fat pad was resected with care taken to protect the patellar tendon. The suprapatellar fat was removed to exposed the distal anterior femur. The anterior horn of the lateral meniscus and ACL were released.    Following initial  exposure, I first started with the femur  The femoral  canal was opened with a drill, canal was suctioned to try to prevent fat emboli.  An  intramedullary rod was passed set at 5 degrees valgus, 10 mm. The distal femur was resected.  Following this resection, the tibia was  subluxated anteriorly.  Using the extramedullary guide, 10 mm of bone was resected off   the proximal lateral tibia.  We confirmed the gap would be  stable medially and laterally with a size 73mm spacer block as well as confirmed that the tibial cut was perpendicular in the coronal plane, checking with an  alignment rod.    Once this was done, the posterior femoral referencing femoral sizer was placed under to the posterior condyles with 1 degrees of external rotational which was parallel to the transepicondylar axis and perpendicular to [employer]. The femur was sized to be a size  7 in the anterior-  posterior dimension. The  anterior, posterior, and  chamfer cuts were made without difficulty nor   notching making certain that I was along the anterior cortex to help  with flexion gap stability. Next a laminar spreader was placed with the knee in flexion and the medial lateral menisci were resected.  5 cc of the Exparel mixture was injected in the medial side of the back of the knee and 3 cc in the lateral side.  1/2 inch curved osteotome was used to resect posterior osteophyte that was then removed with a pituitary rongeur.       At this point, the tibia was sized to be a size F.  The size F tray was  then pinned in position. Trial reduction was now carried with a 7 femur, F tibia, a 10 mm MC insert. There was slight laxity in extension which improved with 53mm MC insert.  The knee had full extension and was stable to varus valgus stress in extension.  The PCL was left intact.  Attention was next directed to the patella.  Precut  measurement was noted to be 23 mm.  I resected down to 14 mm and used a  65mm patellar button to restore patellar height as well as cover the cut surface.     The patella lug holes were drilled and a 97mm patella poly trial was placed.    The knee was brought to full extension with good flexion stability with the patella tracking through the trochlea without application of pressure.     Next the femoral component was again assessed and determined to be seated and appropriately lateralized.  The femoral lug holes were drilled.  The femoral component was then removed. Tibial component was again assessed and felt to be seated and appropriately rotated with the medial third of the tubercle. The tibia was then drilled, and keel punched.     Final components were  opened and cement was mixed.      Final implants were then  cemented onto cleaned and dried cut surfaces of bone with the knee brought to extension with a 11 mm MC poly.  The knee was irrigated  with sterile Betadine diluted in saline as well as pulse lavage normal saline.  The synovial lining was  then injected a dilute Exparel.      Once the cement had fully cured, excess cement was removed throughout the knee.  I confirmed that I was satisfied with the range of motion and stability, and the final 11 mm MC poly insert was chosen.  It was placed into the knee.         The tourniquet had been let down.  No significant hemostasis was required.  The medial parapatellar arthrotomy was then reapproximated using #1 Stratafix sutures with the knee  in flexion.  The remaining wound was closed with 0 Vicryl, 2-0 Vicryl, and running 3-0 Monocryl. The knee was cleaned, dried, dressed sterilely using Dermabond and   Aquacel dressing.  The patient was then brought to recovery room in stable condition, tolerating the procedure  well. There were no complications.   Post op recs: WB: WBAT Abx: ancef Imaging: PACU xrays DVT  prophylaxis: Aspirin 81mg  BID x4 weeks Follow up: 2 weeks after surgery for a wound check with Dr. Zachery Dakins at [facility].  Address: [address], [place], [place] [address]  Office Phone: [phone]  Charlies Constable, MD Orthopaedic Surgery

## 2022-11-23 NOTE — Progress Notes (Signed)
Orthopedic Tech Progress Note Patient Details:  Sergio [name] 04/08/1951 [id number]  Ortho Devices Type of Ortho Device: Bone foam zero knee Ortho Device/Splint Location: RUE Ortho Device/Splint Interventions: Ordered, Application, Adjustment   Post Interventions Patient Tolerated: Well Instructions Provided: Care of device, Adjustment of device Bone foam applied and instructions on use provided.  Darleen Crocker 11/23/2022, 6:43 PM

## 2022-11-23 NOTE — Interval H&P Note (Signed)

## 2022-11-24 ENCOUNTER — Encounter (HOSPITAL_COMMUNITY): Payer: Self-pay | Admitting: Orthopedic Surgery

## 2022-11-24 DIAGNOSIS — M1711 Unilateral primary osteoarthritis, right knee: Secondary | ICD-10-CM | POA: Diagnosis not present

## 2022-11-24 LAB — CBC
HCT: 42 % (ref 39.0–52.0)
Hemoglobin: 13.1 g/dL (ref 13.0–17.0)
MCH: 21.9 pg — ABNORMAL LOW (ref 26.0–34.0)
MCHC: 31.2 g/dL (ref 30.0–36.0)
MCV: 70.2 fL — ABNORMAL LOW (ref 80.0–100.0)
Platelets: 198 10*3/uL (ref 150–400)
RBC: 5.98 MIL/uL — ABNORMAL HIGH (ref 4.22–5.81)
RDW: 21.4 % — ABNORMAL HIGH (ref 11.5–15.5)
WBC: 10.2 10*3/uL (ref 4.0–10.5)
nRBC: 0 % (ref 0.0–0.2)

## 2022-11-24 LAB — BASIC METABOLIC PANEL
Anion gap: 7 (ref 5–15)
BUN: 15 mg/dL (ref 8–23)
CO2: 23 mmol/L (ref 22–32)
Calcium: 8.7 mg/dL — ABNORMAL LOW (ref 8.9–10.3)
Chloride: 102 mmol/L (ref 98–111)
Creatinine, Ser: 1.02 mg/dL (ref 0.61–1.24)
GFR, Estimated: >60 mL/min (ref >60)
Glucose, Bld: 162 mg/dL — ABNORMAL HIGH (ref 70–99)
Potassium: 3.8 mmol/L (ref 3.5–5.1)
Sodium: 132 mmol/L — ABNORMAL LOW (ref 135–145)

## 2022-11-24 MED ORDER — CELECOXIB 100 MG PO CAPS: 100.0000 mg | ORAL_CAPSULE | Freq: Two times a day (BID) | ORAL | 0 refills | Status: AC

## 2022-11-24 MED ORDER — METHOCARBAMOL 500 MG PO TABS: 500.0000 mg | ORAL_TABLET | Freq: Three times a day (TID) | ORAL | 0 refills | Status: AC | PRN

## 2022-11-24 MED ORDER — ASPIRIN 81 MG PO TBEC: 81.0000 mg | DELAYED_RELEASE_TABLET | Freq: Two times a day (BID) | ORAL | 0 refills | Status: AC

## 2022-11-24 MED ORDER — OXYCODONE HCL 5 MG PO TABS: 5.0000 mg | ORAL_TABLET | ORAL | 0 refills | Status: AC | PRN

## 2022-11-24 MED ORDER — ONDANSETRON HCL 4 MG PO TABS: 4.0000 mg | ORAL_TABLET | Freq: Three times a day (TID) | ORAL | 0 refills | Status: AC | PRN

## 2022-11-24 NOTE — Evaluation (Signed)
Physical Therapy Evaluation Patient Details Name: Sergio [name] MRN: [id number] DOB: 1951/02/16 Today's Date: 11/24/2022  History of Present Illness  71 yo male s/p R TKA on 11/23/22. PMH: ED, HTN, COVID, retinal detachment and subsequent surgery  Clinical Impression  Pt is s/p TKA resulting in the deficits listed below (see PT Problem List).  Pt motivated, requiring cues for safety. Pt reports his grandson cannot pick him up until after 2-3, will return to second session to review  stairs.  Pt will benefit from skilled PT to increase their independence and safety with mobility to allow discharge to the venue listed below.         Recommendations for follow up therapy are one component of a multi-disciplinary discharge planning process, led by the attending physician.  Recommendations may be updated based on patient status, additional functional criteria and insurance authorization.  Follow Up Recommendations Follow physician's recommendations for discharge plan and follow up therapies      Assistance Recommended at Discharge Frequent or constant Supervision/Assistance  Patient can return home with the following  Help with stairs or ramp for entrance;Assist for transportation;Assistance with cooking/housework    Equipment Recommendations None recommended by PT  Recommendations for Other Services       Functional Status Assessment Patient has had a recent decline in their functional status and demonstrates the ability to make significant improvements in function in a reasonable and predictable amount of time.     Precautions / Restrictions Precautions Precautions: Fall;Knee Required Braces or Orthoses: Knee Immobilizer - Right Restrictions Weight Bearing Restrictions: No      Mobility  Bed Mobility Overal bed mobility: Needs Assistance Bed Mobility: Supine to Sit     Supine to sit: Supervision     General bed mobility comments: for safety    Transfers Overall  transfer level: Needs assistance Equipment used: Rolling walker (2 wheels) Transfers: Sit to/from Stand Sit to Stand: Min guard, Supervision           General transfer comment: verbal cues for hand placement, RLE position and overall safety    Ambulation/Gait Ambulation/Gait assistance: Min guard Gait Distance (Feet): 80 Feet Assistive device: Rolling walker (2 wheels) Gait Pattern/deviations: Step-to pattern, Decreased stance time - right       General Gait Details: verbal cues for placing R foot on floor and wt as tolerated, sequence, RW safety  Stairs            Wheelchair Mobility    Modified Rankin (Stroke Patients Only)       Balance                                             Pertinent Vitals/Pain Pain Assessment Pain Assessment: 0-10 Pain Score: 4  Pain Location: right knee Pain Descriptors / Indicators: Discomfort Pain Intervention(s): Limited activity within patient's tolerance, Monitored during session, Premedicated before session, Repositioned    Home Living Family/patient expects to be discharged to:: Private residence Living Arrangements: Spouse/significant other Available Help at Discharge: Family Type of Home: House Home Access: Stairs to enter   Secretary/administrator of Steps: 3   Home Layout: One level Home Equipment: Agricultural consultant (2 wheels)      Prior Function Prior Level of Function : Independent/Modified Independent  Hand Dominance        Extremity/Trunk Assessment   Upper Extremity Assessment Upper Extremity Assessment: Overall WFL for tasks assessed    Lower Extremity Assessment Lower Extremity Assessment: RLE deficits/detail RLE Deficits / Details: AAROM knee flexion ~ 5 to 85 degrees. strength ~ 3/5       Communication      Cognition Arousal/Alertness: Awake/alert Behavior During Therapy: WFL for tasks assessed/performed Overall Cognitive Status: Within  Functional Limits for tasks assessed                                          General Comments      Exercises Total Joint Exercises Ankle Circles/Pumps: AROM, Both, 10 reps Quad Sets: 10 reps, Both, AROM Heel Slides: AROM, AAROM, Right, 10 reps Long Arc Quad: AROM, Right, 5 reps, Seated   Assessment/Plan    PT Assessment Patient needs continued PT services  PT Problem List Decreased strength;Decreased range of motion;Decreased activity tolerance;Decreased balance;Decreased mobility;Decreased knowledge of precautions;Decreased knowledge of use of DME;Pain       PT Treatment Interventions DME instruction;Gait training;Functional mobility training;Therapeutic activities;Patient/family education;Therapeutic exercise;Stair training    PT Goals (Current goals can be found in the Care Plan section)  Acute Rehab PT Goals Patient Stated Goal: home today PT Goal Formulation: With patient Time For Goal Achievement: 12/01/22 Potential to Achieve Goals: Good    Frequency 7X/week     Co-evaluation               AM-PAC PT "6 Clicks" Mobility  Outcome Measure Help needed turning from your back to your side while in a flat bed without using bedrails?: None Help needed moving from lying on your back to sitting on the side of a flat bed without using bedrails?: None Help needed moving to and from a bed to a chair (including a wheelchair)?: A Little Help needed standing up from a chair using your arms (e.g., wheelchair or bedside chair)?: A Little Help needed to walk in hospital room?: A Little Help needed climbing 3-5 steps with a railing? : A Little 6 Click Score: 20    End of Session Equipment Utilized During Treatment: Gait belt Activity Tolerance: Patient tolerated treatment well Patient left: in chair;with call bell/phone within reach;with chair alarm set Nurse Communication: Mobility status PT Visit Diagnosis: Other abnormalities of gait and mobility  (R26.89);Difficulty in walking, not elsewhere classified (R26.2)    Time: [id number] PT Time Calculation (min) (ACUTE ONLY): 20 min   Charges:   PT Evaluation $PT Eval Low Complexity: [place] [address], PT  Acute Rehab Dept [facility]) [phone]  [facility] Weekend Pager [facility] only)  [phone]  11/24/2022   [facility] 11/24/2022, 10:13 AM

## 2022-11-24 NOTE — TOC Transition Note (Signed)
Transition of Care [facility]) - CM/SW Discharge Note  Patient Details  Name: Sergio [name] MRN: [id number] Date of Birth: 02-25-1951  Transition of Care [facility]) CM/SW Contact:  Sherie Don, LCSW Phone Number: 11/24/2022, 10:30 AM  Clinical Narrative: Patient is expected to discharge home after working with PT. CSW met with patient to confirm discharge plan. Patient reported he will go home with OPPT at [facility]. Patient has a rolling walker at home, so there are no DME needs at this time. TOC signing off.    Final next level of care: OP Rehab Barriers to Discharge: No Barriers Identified  Patient Goals and CMS Choice Patient states their goals for this hospitalization and ongoing recovery are:: Discharge home with OPPT at [facility] Choice offered to / list presented to : NA  Discharge Plan and Services    DME Arranged: N/A DME Agency: NA  Social Determinants of Health (SDOH) Interventions Food Insecurity Interventions: Intervention Not Indicated Housing Interventions: Intervention Not Indicated Transportation Interventions: Intervention Not Indicated Utilities Interventions: Intervention Not Indicated  Readmission Risk Interventions     No data to display

## 2022-11-24 NOTE — Discharge Summary (Signed)
Physician Discharge Summary  Patient ID: Sergio [name] MRN: [id number] DOB/AGE: 1951-10-10 71 y.o.  Admit date: 11/23/2022 Discharge date: 11/24/2022  Admission Diagnoses:  Localized osteoarthritis of right knee  Discharge Diagnoses:  Principal Problem:   Localized osteoarthritis of right knee   Past Medical History:  Diagnosis Date   Arthritis    Enlarged prostate    Erectile dysfunction    History of colonoscopy    History of COVID-19    Hypertension    Low testosterone    Low testosterone    Pneumonia    2022    Surgeries: Procedure(s): TOTAL KNEE ARTHROPLASTY on 11/23/2022   Consultants (if any):   Discharged Condition: Improved  Hospital Course: Sergio [name] is an 71 y.o. male who was admitted 11/23/2022 with a diagnosis of Localized osteoarthritis of right knee and went to the operating room on 11/23/2022 and underwent the above named procedures.    He was given perioperative antibiotics:  Anti-infectives (From admission, onward)    Start     Dose/Rate Route Frequency Ordered Stop   11/23/22 2000  ceFAZolin (ANCEF) IVPB 2g/100 mL premix        2 g 200 mL/hr over 30 Minutes Intravenous Every 6 hours 11/23/22 1758 11/24/22 0321   11/23/22 1030  ceFAZolin (ANCEF) IVPB 2g/100 mL premix        2 g 200 mL/hr over 30 Minutes Intravenous On call to O.R. 11/23/22 1028 11/23/22 1340     .  He was given sequential compression devices, early ambulation, and aspirin for DVT prophylaxis.  He benefited maximally from the hospital stay and there were no complications.    Recent vital signs:  Vitals:   11/23/22 2352 11/24/22 0357  BP: (!) 142/86 (!) 150/99  Pulse: 92 93  Resp: 20 20  Temp: 97.8 F (36.6 C) 97.6 F (36.4 C)  SpO2: 95% 96%    Recent laboratory studies:  Lab Results  Component Value Date   HGB 13.1 11/24/2022   HGB 15.3 11/02/2022   HGB 15.2 07/31/2022   Lab Results  Component Value Date   WBC 10.2 11/24/2022   PLT 198 11/24/2022    No results found for: "INR" Lab Results  Component Value Date   NA 132 (L) 11/24/2022   K 3.8 11/24/2022   CL 102 11/24/2022   CO2 23 11/24/2022   BUN 15 11/24/2022   CREATININE 1.02 11/24/2022   GLUCOSE 162 (H) 11/24/2022    Discharge Medications:   Allergies as of 11/24/2022   No Known Allergies      Medication List     TAKE these medications    aspirin EC 81 MG tablet Take 81 mg by mouth daily. What changed: Another medication with the same name was added. Make sure you understand how and when to take each.   aspirin EC 81 MG tablet Take 1 tablet (81 mg total) by mouth 2 (two) times daily for 28 days. Swallow whole. Start taking on: November 25, 2022 What changed: You were already taking a medication with the same name, and this prescription was added. Make sure you understand how and when to take each.   celecoxib 100 MG capsule Commonly known as: CeleBREX Take 1 capsule (100 mg total) by mouth 2 (two) times daily for 14 days.   hydrochlorothiazide 25 MG tablet Commonly known as: HYDRODIURIL TAKE 1 TABLET (25 MG TOTAL) BY MOUTH DAILY.   lisinopril 20 MG tablet Commonly known as: ZESTRIL Take 1 tablet (20  mg total) by mouth daily.   methocarbamol 500 MG tablet Commonly known as: ROBAXIN Take 1 tablet (500 mg total) by mouth every 8 (eight) hours as needed for up to 10 days for muscle spasms.   multivitamin tablet Take 1 tablet by mouth daily.   ondansetron 4 MG tablet Commonly known as: Zofran Take 1 tablet (4 mg total) by mouth every 8 (eight) hours as needed for up to 14 days for nausea or vomiting.   oxyCODONE 5 MG immediate release tablet Commonly known as: Roxicodone Take 1 tablet (5 mg total) by mouth every 4 (four) hours as needed for up to 7 days for severe pain or moderate pain.   sildenafil 20 MG tablet Commonly known as: REVATIO Take 1 tablet (20 mg total) by mouth as needed.   silodosin 8 MG Caps capsule Commonly known as:  RAPAFLO Take 1 capsule (8 mg total) by mouth daily with breakfast.   Testosterone Cypionate 100 MG/ML Soln 10% GEL. 4 clicks daily. What changed:  how much to take how to take this when to take this        Diagnostic Studies: DG Knee Right Port  Result Date: 11/23/2022 CLINICAL DATA:  Status post total right knee arthroplasty. EXAM: PORTABLE RIGHT KNEE - 1-2 VIEW COMPARISON:  None Available. FINDINGS: Status post recent total right knee arthroplasty. No perihardware lucency is seen to indicate hardware failure or loosening. Expected postoperative intra-articular and subcutaneous air. Mild-to-moderate joint effusion. No acute fracture or dislocation. IMPRESSION: Status post total right knee arthroplasty without evidence of hardware failure. Electronically Signed   By: Neita Garnet M.D.   On: 11/23/2022 16:54    Disposition: Discharge disposition: 01-Home or Self Care       Discharge Instructions     Call MD / Call 911   Complete by: As directed    If you experience chest pain or shortness of breath, CALL 911 and be transported to the hospital emergency room.  If you develope a fever above 101 F, pus (white drainage) or increased drainage or redness at the wound, or calf pain, call your surgeon's office.   Constipation Prevention   Complete by: As directed    Drink plenty of fluids.  Prune juice may be helpful.  You may use a stool softener, such as Colace (over the counter) 100 mg twice a day.  Use MiraLax (over the counter) for constipation as needed.   Diet - low sodium heart healthy   Complete by: As directed    Do not put a pillow under the knee. Place it under the heel.   Complete by: As directed    Increase activity slowly as tolerated   Complete by: As directed    Post-operative opioid taper instructions:   Complete by: As directed    POST-OPERATIVE OPIOID TAPER INSTRUCTIONS: It is important to wean off of your opioid medication as soon as possible. If you do not  need pain medication after your surgery it is ok to stop day one. Opioids include: Codeine, Hydrocodone(Norco, Vicodin), Oxycodone(Percocet, oxycontin) and hydromorphone amongst others.  Long term and even short term use of opiods can cause: Increased pain response Dependence Constipation Depression Respiratory depression And more.  Withdrawal symptoms can include Flu like symptoms Nausea, vomiting And more Techniques to manage these symptoms Hydrate well Eat regular healthy meals Stay active Use relaxation techniques(deep breathing, meditating, yoga) Do Not substitute Alcohol to help with tapering If you have been on opioids for less than two  weeks and do not have pain than it is ok to stop all together.  Plan to wean off of opioids This plan should start within one week post op of your joint replacement. Maintain the same interval or time between taking each dose and first decrease the dose.  Cut the total daily intake of opioids by one tablet each day Next start to increase the time between doses. The last dose that should be eliminated is the evening dose.               Discharge Instructions      INSTRUCTIONS AFTER JOINT REPLACEMENT   Remove items at home which could result in a fall. This includes throw rugs or furniture in walking pathways ICE to the affected joint every three hours while awake for 30 minutes at a time, for at least the first 3-5 days, and then as needed for pain and swelling.  Continue to use ice for pain and swelling. You may notice swelling that will progress down to the foot and ankle.  This is normal after surgery.  Elevate your leg when you are not up walking on it.   Continue to use the breathing machine you got in the hospital (incentive spirometer) which will help keep your temperature down.  It is common for your temperature to cycle up and down following surgery, especially at night when you are not up moving around and exerting  yourself.  The breathing machine keeps your lungs expanded and your temperature down.   DIET:  As you were doing prior to hospitalization, we recommend a well-balanced diet.  DRESSING / WOUND CARE / SHOWERING  Keep the surgical dressing until follow up.  The dressing is water proof, so you can shower without any extra covering.  IF THE DRESSING FALLS OFF or the wound gets wet inside, change the dressing with sterile gauze.  Please use good hand washing techniques before changing the dressing.  Do not use any lotions or creams on the incision until instructed by your surgeon.    ACTIVITY  Increase activity slowly as tolerated, but follow the weight bearing instructions below.   No driving for 6 weeks or until further direction given by your physician.  You cannot drive while taking narcotics.  No lifting or carrying greater than 10 lbs. until further directed by your surgeon. Avoid periods of inactivity such as sitting longer than an hour when not asleep. This helps prevent blood clots.  You may return to work once you are authorized by your doctor.     WEIGHT BEARING   Weight bearing as tolerated with assist device (walker, cane, etc) as directed, use it as long as suggested by your surgeon or therapist, typically at least 4-6 weeks.   EXERCISES  Results after joint replacement surgery are often greatly improved when you follow the exercise, range of motion and muscle strengthening exercises prescribed by your doctor. Safety measures are also important to protect the joint from further injury. Any time any of these exercises cause you to have increased pain or swelling, decrease what you are doing until you are comfortable again and then slowly increase them. If you have problems or questions, call your caregiver or physical therapist for advice.   Rehabilitation is important following a joint replacement. After just a few days of immobilization, the muscles of the leg can become weakened  and shrink (atrophy).  These exercises are designed to build up the tone and strength of the thigh and  leg muscles and to improve motion. Often times heat used for twenty to thirty minutes before working out will loosen up your tissues and help with improving the range of motion but do not use heat for the first two weeks following surgery (sometimes heat can increase post-operative swelling).   These exercises can be done on a training (exercise) mat, on the floor, on a table or on a bed. Use whatever works the best and is most comfortable for you.    Use music or television while you are exercising so that the exercises are a pleasant break in your day. This will make your life better with the exercises acting as a break in your routine that you can look forward to.   Perform all exercises about fifteen times, three times per day or as directed.  You should exercise both the operative leg and the other leg as well.  Exercises include:   Quad Sets - Tighten up the muscle on the front of the thigh (Quad) and hold for 5-10 seconds.   Straight Leg Raises - With your knee straight (if you were given a brace, keep it on), lift the leg to 60 degrees, hold for 3 seconds, and slowly lower the leg.  Perform this exercise against resistance later as your leg gets stronger.  Leg Slides: Lying on your back, slowly slide your foot toward your buttocks, bending your knee up off the floor (only go as far as is comfortable). Then slowly slide your foot back down until your leg is flat on the floor again.  Angel Wings: Lying on your back spread your legs to the side as far apart as you can without causing discomfort.  Hamstring Strength:  Lying on your back, push your heel against the floor with your leg straight by tightening up the muscles of your buttocks.  Repeat, but this time bend your knee to a comfortable angle, and push your heel against the floor.  You may put a pillow under the heel to make it more comfortable  if necessary.   A rehabilitation program following joint replacement surgery can speed recovery and prevent re-injury in the future due to weakened muscles. Contact your doctor or a physical therapist for more information on knee rehabilitation.    CONSTIPATION  Constipation is defined medically as fewer than three stools per week and severe constipation as less than one stool per week.  Even if you have a regular bowel pattern at home, your normal regimen is likely to be disrupted due to multiple reasons following surgery.  Combination of anesthesia, postoperative narcotics, change in appetite and fluid intake all can affect your bowels.   YOU MUST use at least one of the following options; they are listed in order of increasing strength to get the job done.  They are all available over the counter, and you may need to use some, POSSIBLY even all of these options:    Drink plenty of fluids (prune juice may be helpful) and high fiber foods Colace 100 mg by mouth twice a day  Senokot for constipation as directed and as needed Dulcolax (bisacodyl), take with full glass of water  Miralax (polyethylene glycol) once or twice a day as needed.  If you have tried all these things and are unable to have a bowel movement in the first 3-4 days after surgery call either your surgeon or your primary doctor.    If you experience loose stools or diarrhea, hold the medications until you  stool forms back up.  If your symptoms do not get better within 1 week or if they get worse, check with your doctor.  If you experience "the worst abdominal pain ever" or develop nausea or vomiting, please contact the office immediately for further recommendations for treatment.   ITCHING:  If you experience itching with your medications, try taking only a single pain pill, or even half a pain pill at a time.  You can also use Benadryl over the counter for itching or also to help with sleep.   TED HOSE STOCKINGS:  Use  stockings on both legs until for at least 2 weeks or as directed by physician office. They may be removed at night for sleeping.  MEDICATIONS:  See your medication summary on the "After Visit Summary" that nursing will review with you.  You may have some home medications which will be placed on hold until you complete the course of blood thinner medication.  It is important for you to complete the blood thinner medication as prescribed.   Blood clot prevention (DVT Prophylaxis): After surgery you are at an increased risk for a blood clot. you were prescribed a blood thinner, Aspirin 81mg , to be taken twice daily for a total of 4 weeks from surgery to help reduce your risk of getting a blood clot. This will help prevent a blood clot. Signs of a pulmonary embolus (blood clot in the lungs) include sudden short of breath, feeling lightheaded or dizzy, chest pain with a deep breath, rapid pulse rapid breathing. Signs of a blood clot in your arms or legs include new unexplained swelling and cramping, warm, red or darkened skin around the painful area. Please call the office or 911 right away if these signs or symptoms develop.  PRECAUTIONS:  If you experience chest pain or shortness of breath - call 911 immediately for transfer to the hospital emergency department.   If you develop a fever greater that 101 F, purulent drainage from wound, increased redness or drainage from wound, foul odor from the wound/dressing, or calf pain - CONTACT YOUR SURGEON.                                                   FOLLOW-UP APPOINTMENTS:  If you do not already have a post-op appointment, please call the office for an appointment to be seen by your surgeon.  Guidelines for how soon to be seen are listed in your "After Visit Summary", but are typically between 2-3 weeks after surgery.  OTHER INSTRUCTIONS:   Knee Replacement:  Do not place pillow under knee, focus on keeping the knee straight while resting.  DO NOT modify,  tear, cut, or change the foam block in any way.  POST-OPERATIVE OPIOID TAPER INSTRUCTIONS: It is important to wean off of your opioid medication as soon as possible. If you do not need pain medication after your surgery it is ok to stop day one. Opioids include: Codeine, Hydrocodone(Norco, Vicodin), Oxycodone(Percocet, oxycontin) and hydromorphone amongst others.  Long term and even short term use of opiods can cause: Increased pain response Dependence Constipation Depression Respiratory depression And more.  Withdrawal symptoms can include Flu like symptoms Nausea, vomiting And more Techniques to manage these symptoms Hydrate well Eat regular healthy meals Stay active Use relaxation techniques(deep breathing, meditating, yoga) Do Not substitute Alcohol to help  with tapering If you have been on opioids for less than two weeks and do not have pain than it is ok to stop all together.  Plan to wean off of opioids This plan should start within one week post op of your joint replacement. Maintain the same interval or time between taking each dose and first decrease the dose.  Cut the total daily intake of opioids by one tablet each day Next start to increase the time between doses. The last dose that should be eliminated is the evening dose.   MAKE SURE YOU:  Understand these instructions.  Get help right away if you are not doing well or get worse.    Thank you for letting us be a part of your medical care team.  It is a privilege we respect greatly.  We hope these instructions will help you stay on track for a fast and full recovery!           Signed: Redell Nazir A Gaynor Ferreras 11/24/2022, 6:16 AM

## 2022-11-24 NOTE — Plan of Care (Signed)
  Problem: Education: Goal: Knowledge of the prescribed therapeutic regimen will improve Outcome: Completed/Met Goal: Individualized Educational Video(s) Outcome: Completed/Met   Problem: Activity: Goal: Ability to avoid complications of mobility impairment will improve Outcome: Completed/Met Goal: Range of joint motion will improve Outcome: Completed/Met   Problem: Clinical Measurements: Goal: Postoperative complications will be avoided or minimized Outcome: Completed/Met   Problem: Pain Management: Goal: Pain level will decrease with appropriate interventions Outcome: Completed/Met   Problem: Skin Integrity: Goal: Will show signs of wound healing Outcome: Completed/Met   Problem: Education: Goal: Knowledge of General Education information will improve Description: Including pain rating scale, medication(s)/side effects and non-pharmacologic comfort measures Outcome: Completed/Met   Problem: Health Behavior/Discharge Planning: Goal: Ability to manage health-related needs will improve Outcome: Completed/Met   Problem: Clinical Measurements: Goal: Ability to maintain clinical measurements within normal limits will improve Outcome: Completed/Met Goal: Will remain free from infection Outcome: Completed/Met Goal: Diagnostic test results will improve Outcome: Completed/Met Goal: Respiratory complications will improve Outcome: Completed/Met Goal: Cardiovascular complication will be avoided Outcome: Completed/Met   Problem: Activity: Goal: Risk for activity intolerance will decrease Outcome: Completed/Met   Problem: Nutrition: Goal: Adequate nutrition will be maintained Outcome: Completed/Met   Problem: Coping: Goal: Level of anxiety will decrease Outcome: Completed/Met   Problem: Elimination: Goal: Will not experience complications related to bowel motility Outcome: Completed/Met Goal: Will not experience complications related to urinary retention Outcome:  Completed/Met   Problem: Pain Managment: Goal: General experience of comfort will improve Outcome: Completed/Met   Problem: Safety: Goal: Ability to remain free from injury will improve Outcome: Completed/Met   Problem: Skin Integrity: Goal: Risk for impaired skin integrity will decrease Outcome: Completed/Met

## 2022-11-24 NOTE — Anesthesia Postprocedure Evaluation (Signed)
Anesthesia Post Note  Patient: Sergio [name]  Procedure(s) Performed: TOTAL KNEE ARTHROPLASTY (Right: Knee)     Patient location during evaluation: PACU Anesthesia Type: Spinal and Regional Level of consciousness: oriented and awake and alert Pain management: pain level controlled Vital Signs Assessment: post-procedure vital signs reviewed and stable Respiratory status: spontaneous breathing, respiratory function stable and patient connected to nasal cannula oxygen Cardiovascular status: blood pressure returned to baseline and stable Postop Assessment: no headache, no backache and no apparent nausea or vomiting Anesthetic complications: no  No notable events documented.  Last Vitals:  Vitals:   11/23/22 2352 11/24/22 0357  BP: (!) 142/86 (!) 150/99  Pulse: 92 93  Resp: 20 20  Temp: 36.6 C 36.4 C  SpO2: 95% 96%    Last Pain:  Vitals:   11/24/22 0743  TempSrc:   PainSc: 3                  Kordell Jafri L Rapheal Masso

## 2022-11-24 NOTE — Plan of Care (Signed)
  Problem: Education: Goal: Knowledge of the prescribed therapeutic regimen will improve Outcome: Progressing   Problem: Activity: Goal: Range of joint motion will improve Outcome: Progressing   Problem: Pain Management: Goal: Pain level will decrease with appropriate interventions Outcome: Progressing   

## 2022-11-24 NOTE — Progress Notes (Signed)
PT TX NOTE  11/24/22 1400  PT Visit Information  Last PT Received On 11/24/22  Assistance Needed Pt progressing well this session. Initial cues for safety with RW. Recommend supervision at home and cautioned pt to utilize RW until advised to transition to cane by OPPT.motivated to continue rehab   History of Present Illness 71 yo male s/p R TKA on 11/23/22. PMH: ED, HTN, COVID, retinal detachment and subsequent surgery  Subjective Data  Patient Stated Goal home today  Precautions  Precautions Fall;Knee  Required Braces or Orthoses Knee Immobilizer - Right  Knee Immobilizer - Right Discontinue once straight leg raise with < 10 degree lag  Restrictions  Weight Bearing Restrictions No  Other Position/Activity Restrictions WBAT  Pain Assessment  Pain Assessment 0-10  Pain Score 5  Pain Location right knee  Pain Descriptors / Indicators Discomfort  Pain Intervention(s) Limited activity within patient's tolerance;Monitored during session;Premedicated before session;Repositioned;Ice applied  Cognition  Arousal/Alertness Awake/alert  Behavior During Therapy WFL for tasks assessed/performed  Overall Cognitive Status Within Functional Limits for tasks assessed  Bed Mobility  General bed mobility comments in recliner and returned to same  Transfers  Overall transfer level Needs assistance  Equipment used Rolling walker (2 wheels)  Transfers Sit to/from Stand  Sit to Stand Supervision;Min guard  General transfer comment verbal cues for hand placement, RLE position and overall safety  Ambulation/Gait  Ambulation/Gait assistance Min guard;Supervision  Gait Distance (Feet) 200 Feet  Assistive device Rolling walker (2 wheels)  Gait Pattern/deviations Step-to pattern;Step-through pattern  General Gait Details improved wt shift to RLE, unsteady initially however much  improved with incr distance  Stairs Yes  Stairs assistance Min guard;Min assist  Stair Management Step to  pattern;Backwards;With walker  Number of Stairs 2  General stair comments cues for sequence and RW position  PT - End of Session  Equipment Utilized During Treatment Gait belt  Activity Tolerance Patient tolerated treatment well  Patient left in chair;with call bell/phone within reach;with chair alarm set  Nurse Communication Mobility status   PT - Assessment/Plan  PT Plan Current plan remains appropriate  PT Visit Diagnosis Other abnormalities of gait and mobility (R26.89);Difficulty in walking, not elsewhere classified (R26.2)  PT Frequency (ACUTE ONLY) 7X/week  Follow Up Recommendations Follow physician's recommendations for discharge plan and follow up therapies  Assistance recommended at discharge Frequent or constant Supervision/Assistance  Patient can return home with the following Help with stairs or ramp for entrance;Assist for transportation;Assistance with cooking/housework  PT equipment None recommended by PT  AM-PAC PT "6 Clicks" Mobility Outcome Measure (Version 2)  Help needed turning from your back to your side while in a flat bed without using bedrails? 4  Help needed moving from lying on your back to sitting on the side of a flat bed without using bedrails? 4  Help needed moving to and from a bed to a chair (including a wheelchair)? 3  Help needed standing up from a chair using your arms (e.g., wheelchair or bedside chair)? 3  Help needed to walk in hospital room? 3  Help needed climbing 3-5 steps with a railing?  3  6 Click Score 20  Consider Recommendation of Discharge To: Home with no services  Progressive Mobility  What is the highest level of mobility based on the progressive mobility assessment? Level 5 (Walks with assist in room/hall) - Balance while stepping forward/back and can walk in room with assist - Complete  PT Goal Progression  Progress towards PT goals Progressing  toward goals  Acute Rehab PT Goals  PT Goal Formulation With patient  Time For Goal  Achievement 12/01/22  Potential to Achieve Goals Good  PT Time Calculation  PT Start Time (ACUTE ONLY) 1341  PT Stop Time (ACUTE ONLY) 1400  PT Time Calculation (min) (ACUTE ONLY) 19 min  PT General Charges  $$ ACUTE PT VISIT 1 Visit  PT Treatments  $Gait Training 8-22 mins

## 2022-11-24 NOTE — Progress Notes (Signed)
     Subjective:  Patient reports pain as mild.  Eager to get foley out this morning. Hasn't worked with PT yet. Hopeful to go home today. Denies distal n/t.  Objective:   VITALS:   Vitals:   11/23/22 1758 11/23/22 1958 11/23/22 2352 11/24/22 0357  BP: (!) 131/92 126/80 (!) 142/86 (!) 150/99  Pulse: 74 100 92 93  Resp: _0 Temp: 98.6 F (37 C) 97.9 F (36.6 C) 97.8 F (36.6 C) 97.6 F (36.4 C)  TempSrc: Oral Oral    SpO2:  97% 95% 96%  Weight: 78.5 kg     Height: _1  (1.727 m)       Sensation intact distally Intact pulses distally Dorsiflexion/Plantar flexion intact Incision: dressing C/D/I Compartment soft   Lab Results  Component Value Date   WBC 10.2 11/24/2022   HGB 13.1 11/24/2022   HCT 42.0 11/24/2022   MCV 70.2 (L) 11/24/2022   PLT 198 11/24/2022   BMET    Component Value Date/Time   NA 132 (L) 11/24/2022 0410   K 3.8 11/24/2022 0410   CL 102 11/24/2022 0410   CO2 23 11/24/2022 0410   GLUCOSE 162 (H) 11/24/2022 0410   BUN 15 11/24/2022 0410   CREATININE 1.02 11/24/2022 0410   CREATININE 1.16 07/31/2022 0917   CALCIUM 8.7 (L) 11/24/2022 0410   EGFR 68 07/31/2022 0917   GFRNONAA >60 11/24/2022 0410   Xray: post op xrays show TKA components in good position no adverse features  Assessment/Plan: 1 Day Post-Op   Principal Problem:   Localized osteoarthritis of right knee  S/p R TKA 12/18  Post op recs: WB: WBAT Abx: ancef Imaging: PACU xrays DVT prophylaxis: Aspirin 68m BID x4 weeks Follow up: 2 weeks after surgery [facility] wound [facility]  [address] [address] Office Phone: [phone]   DWillaim Sheng12/19/2023, 6:13 AM   DCharlies Constable MD  Contact information:   [phone]-5pm epic message Dr. MZachery Dakins or call office for patient follow up: [phone] After hours and holidays please check [email or web address] for group call  information for [facility]

## 2022-11-25 ENCOUNTER — Encounter: Payer: Self-pay | Admitting: Physical Therapy

## 2022-11-25 ENCOUNTER — Other Ambulatory Visit: Payer: Self-pay

## 2022-11-25 ENCOUNTER — Ambulatory Visit: Payer: Medicare HMO | Attending: Orthopedic Surgery | Admitting: Physical Therapy

## 2022-11-25 ENCOUNTER — Ambulatory Visit: Payer: Medicare HMO | Admitting: Physical Therapy

## 2022-11-25 DIAGNOSIS — M25561 Pain in right knee: Secondary | ICD-10-CM | POA: Diagnosis present

## 2022-11-25 DIAGNOSIS — Z96651 Presence of right artificial knee joint: Secondary | ICD-10-CM | POA: Diagnosis present

## 2022-11-25 NOTE — Therapy (Unsigned)
OUTPATIENT PHYSICAL THERAPY LOWER EXTREMITY EVALUATION   Patient Name: Sergio [name] MRN: [id number] DOB:09-03-1951, 71 y.o., male Today's Date: 11/25/2022  END OF SESSION:  PT End of Session - 11/25/22 1644     Visit Number 1    Number of Visits 20    Date for PT Re-Evaluation 02/03/23    Authorization Type [employer]    Authorization Time Period 11/25/22-02/03/23    Authorization - Visit Number 1    Authorization - Number of Visits 20    Progress Note Due on Visit 10    PT Start Time 1100    PT Stop Time 1145    PT Time Calculation (min) 45 min    Activity Tolerance Patient tolerated treatment well;Patient limited by pain    Behavior During Therapy [facility] for tasks assessed/performed             Past Medical History:  Diagnosis Date   Arthritis    Enlarged prostate    Erectile dysfunction    History of colonoscopy    History of COVID-19    Hypertension    Low testosterone    Low testosterone    Pneumonia    2022   Past Surgical History:  Procedure Laterality Date   COLONOSCOPY     RETINAL DETACHMENT SURGERY Right    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   Procedure: TOTAL KNEE ARTHROPLASTY;  Surgeon: Joen Laura, MD;  Location: [facility];  Service: Orthopedics;  Laterality: Right;   WISDOM TOOTH EXTRACTION     Patient Active Problem List   Diagnosis Date Noted   Localized osteoarthritis of right knee 11/23/2022   Nonspecific abnormal electrocardiogram (ECG) (EKG) 10/06/2022   Pre-op evaluation 10/06/2022   Essential hypertension 07/09/2021   Erectile dysfunction 07/09/2021   Benign prostatic hyperplasia without lower urinary tract symptoms 07/09/2021   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 Blanchie Dessert   REFERRING DIAG: Right Knee OA   THERAPY DIAG:  Acute pain of right knee  Hx of total knee arthroplasty, right  Rationale for Evaluation and Treatment: Rehabilitation  ONSET DATE:  11/23/22  SUBJECTIVE:   SUBJECTIVE STATEMENT: See pertinent history.   PERTINENT HISTORY: Pt started experiencing right knee pain in February at which point he went to [place] and received an injection. He continued to experience pain even after the injection. Eventually it was advised for him to receive a total knee replacement and he is now POD #2. He has been discharged with packet of information outlining what he should do as far as icing knee and exercises.    PAIN:  Are you having pain? Yes: NPRS scale: 5/10 Pain location: Right knee pain  Pain description: Right knee joint line  Aggravating factors: Bending or straightening knee and weight bearing on right knee  Relieving factors: keeping knee still   PRECAUTIONS: None  WEIGHT BEARING RESTRICTIONS: No  FALLS:  Has patient fallen in last 6 months? No  LIVING ENVIRONMENT: Lives with: lives with their spouse Lives in: House/apartment Stairs: Yes: External: 3 steps; none Has following equipment at home: Dan Humphreys - 2 wheeled and shower chair, recliner, and grabber   OCCUPATION: Fireman   PLOF: Independent  PATIENT GOALS: Get well enough to get back to work and go to air show in May   NEXT MD VISIT: 1st week of Jan  OBJECTIVE:   VITALS: BP 139/89 HR 80 SpO2 79   DIAGNOSTIC FINDINGS:   CLINICAL DATA:  Status post total right knee arthroplasty.   EXAM: PORTABLE RIGHT KNEE - 1-2 VIEW   COMPARISON:  None Available.   FINDINGS: Status post recent total right knee arthroplasty. No perihardware lucency is seen to indicate hardware failure or loosening. Expected postoperative intra-articular and subcutaneous air. Mild-to-moderate joint effusion. No acute fracture or dislocation.   IMPRESSION: Status post total right knee arthroplasty without evidence of hardware failure.     Electronically Signed   By: Neita Garnet M.D.   On: 11/23/2022 16:54    PATIENT SURVEYS:  FOTO 31/100 with target of  58  COGNITION: Overall cognitive status: Within functional limits for tasks assessed     SENSATION: WFL  EDEMA:  Circumferential: R 17.5 inch and L 18 inch   MUSCLE LENGTH: Hamstrings: Right NT ; Left NT   Thomas test: Right NT deg; Left NT   POSTURE: No Significant postural limitations  PALPATION: Patella of right knee   LOWER EXTREMITY ROM:  AROM/PROM Right eval Left eval  Hip flexion    Hip extension    Hip abduction    Hip adduction    Hip internal rotation    Hip external rotation    Knee flexion 60/80* 130/130  Knee extension 15/15* 0/0  Ankle dorsiflexion    Ankle plantarflexion    Ankle inversion    Ankle eversion     (Blank rows = not tested)  LOWER EXTREMITY MMT:  MMT Right eval Left eval  Hip flexion 3 5  Hip extension    Hip abduction    Hip adduction    Hip internal rotation    Hip external rotation    Knee flexion 5 5  Knee extension NT  5  Ankle dorsiflexion    Ankle plantarflexion    Ankle inversion    Ankle eversion     (Blank rows = not tested)  LOWER EXTREMITY SPECIAL TESTS: None performed    FUNCTIONAL TESTS: None performed   GAIT: Distance walked: 50 ft  Assistive device utilized: Walker - 2 wheeled Level of assistance: Modified independence Comments: Decreased stance time on RLE and step length on LLE.    TODAY'S TREATMENT:                                                                                                                              DATE:   11/25/22  All performed on RLE  Quad Sets for 5 sec hold 1 x 10  Glute Sets for 5 sec hold 1 x 10  Heel Slides 1 x 10     PATIENT EDUCATION:  Education details: form and technique for appropriate exercise and explanation about modalities for decreased swelling and pain relief  Person educated: Patient and wife  Education method: Programmer, multimedia, Demonstration, Verbal cues, and Handouts Education comprehension: verbalized understanding, returned demonstration, and  verbal cues required  HOME EXERCISE PROGRAM: Access Code: [email or web address] URL: [email or web address] Date: 11/25/2022 Prepared by: Ellin Goodie  Exercises -  Supine Quad Set  - 3 x weekly - 3 sets - 10 reps - 5-10 sec  hold - Supine Heel Slide  - 3 x weekly - 3 sets - 10 reps - Supine Hip Abduction  - 3 x weekly - 3 sets - 10 reps - Supine Gluteal Sets  - 3 x weekly - 3 sets - 10 reps - 5-10 sec hold  ASSESSMENT:  CLINICAL IMPRESSION: Patient is a 71 y.o. white male who was seen today for physical therapy evaluation and treatment for a total right knee replacement, post operative day 2. 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 He will benefit from skilled to regain right knee mobility and strength to return to walking without an assistive device and bending and stooping to complete job related tasks.   OBJECTIVE IMPAIRMENTS: Abnormal gait, decreased balance, decreased endurance, decreased mobility, difficulty walking, decreased ROM, decreased strength, increased edema, and pain.   ACTIVITY LIMITATIONS: bending, standing, squatting, stairs, bed mobility, dressing, and locomotion level  PARTICIPATION LIMITATIONS: laundry, shopping, community activity, occupation, and yard work  PERSONAL FACTORS: Age, Profession, and 1 comorbidity: HTN   are also affecting patient's functional outcome.   REHAB POTENTIAL: Good  CLINICAL DECISION MAKING: Stable/uncomplicated  EVALUATION COMPLEXITY: Low   GOALS: Goals reviewed with patient? No  SHORT TERM GOALS: Target date: 12/09/2022  Pt will be independent with HEP in order to improve strength and balance in order to decrease fall risk and improve function at home and work. Baseline: Given initial HEP  Goal status: INITIAL  2.  Patient will demonstrate understanding of scar tissue massage technique and perform independently.  Baseline:  Awaiting bandage removal Goal status: INITIAL   LONG TERM GOALS: Target date: 02/03/2023  Patient will have improved function and activity level as evidenced by an increase in FOTO score by 10 points or more.  Baseline:  Goal status: INITIAL  2.  Patient will improve right knee ROM flexion and extension to be nearly symmetrical to left knee AROM.   Baseline: Knee Flex R/L 60*/130, Knee Ext R/L 0*/15  Goal status: INITIAL  3.  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  Baseline: Right knee extension unable to test, Left knee extension 5/5 Goal status: INITIAL  4.  Patient will return to walking without an assistive device as a sign of right knee healing.  Baseline: NT  Goal status: INITIAL    PLAN:  PT FREQUENCY: 1-2x/week  PT DURATION: 10 weeks  PLANNED INTERVENTIONS: Therapeutic exercises, Neuromuscular re-education, Balance training, Gait training, Patient/Family education, Self Care, Joint mobilization, Joint manipulation, DME instructions, Electrical stimulation, Manual therapy, and Re-evaluation, Dry needling, Cryotherapy, Aquatic therapy   PLAN FOR NEXT SESSION: Progress knee mobility strengthening and mobility exercises: SAQ, Seated knee flexion, etc   Ellin Goodie PT, DPT  11/25/2022, 4:46 PM

## 2022-12-02 ENCOUNTER — Encounter: Payer: Self-pay | Admitting: Physical Therapy

## 2022-12-02 ENCOUNTER — Ambulatory Visit: Payer: Medicare HMO | Admitting: Physical Therapy

## 2022-12-02 DIAGNOSIS — M25561 Pain in right knee: Secondary | ICD-10-CM

## 2022-12-02 DIAGNOSIS — Z96651 Presence of right artificial knee joint: Secondary | ICD-10-CM

## 2022-12-02 NOTE — Therapy (Signed)
OUTPATIENT PHYSICAL THERAPY TREATMENT NOTE   Patient Name: Sergio [name] MRN: [id number] DOB:Jan 17, 1951, 71 y.o., male Today's Date: 12/02/2022  PCP: Dr. Marlowe Sax  REFERRING PROVIDER: Dr. Charlies Constable   END OF SESSION:   PT End of Session - 12/02/22 1552     Visit Number 2    Number of Visits 20    Date for PT Re-Evaluation 02/03/23    Authorization Type [employer]    Authorization Time Period 11/25/22-02/03/23    Authorization - Visit Number 2    Authorization - Number of Visits 20    Progress Note Due on Visit 10    PT Start Time [id number]    PT Stop Time 1630    PT Time Calculation (min) 45 min    Activity Tolerance Patient tolerated treatment well;Patient limited by pain    Behavior During Therapy [facility] for tasks assessed/performed             Past Medical History:  Diagnosis Date   Arthritis    Enlarged prostate    Erectile dysfunction    History of colonoscopy    History of COVID-19    Hypertension    Low testosterone    Low testosterone    Pneumonia    2022   Past Surgical History:  Procedure Laterality Date   COLONOSCOPY     RETINAL DETACHMENT SURGERY Right    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   Procedure: TOTAL KNEE ARTHROPLASTY;  Surgeon: Willaim Sheng, MD;  Location: [facility];  Service: Orthopedics;  Laterality: Right;   WISDOM TOOTH EXTRACTION     Patient Active Problem List   Diagnosis Date Noted   Localized osteoarthritis of right knee 11/23/2022   Nonspecific abnormal electrocardiogram (ECG) (EKG) 10/06/2022   Pre-op evaluation 10/06/2022   Essential hypertension 07/09/2021   Erectile dysfunction 07/09/2021   Benign prostatic hyperplasia without lower urinary tract symptoms 07/09/2021   Secondary erythrocytosis 10/25/2018    REFERRING DIAG: S/p right total knee   THERAPY DIAG:  Acute pain of right knee  Hx of total knee arthroplasty, right  Rationale for Evaluation and Treatment Rehabilitation  PERTINENT  HISTORY: Pt started experiencing right knee pain in February at which point he went to [place] and received an injection. He continued to experience pain even after the injection. Eventually it was advised for him to receive a total knee replacement and he is now POD #2. He has been discharged with packet of information outlining what he should do as far as icing knee and exercises.     PRECAUTIONS: None   SUBJECTIVE:  SUBJECTIVE STATEMENT:  Pt reports increased right knee pain especially when trying to keep his knee straight.    PAIN:  Are you having pain? Yes: NPRS scale: 7/10 Pain location: Right knee joint line  Pain description: Aching  Aggravating factors: Straightening knee  Relieving factors: Keep still and bending it a little bit    OBJECTIVE: (objective measures completed at initial evaluation unless otherwise dated)   VITALS: BP 139/89 HR 80 SpO2 79    DIAGNOSTIC FINDINGS:    CLINICAL DATA:  Status post total right knee arthroplasty.   EXAM: PORTABLE RIGHT KNEE - 1-2 VIEW   COMPARISON:  None Available.   FINDINGS: Status post recent total right knee arthroplasty. No perihardware lucency is seen to indicate hardware failure or loosening. Expected postoperative intra-articular and subcutaneous air. Mild-to-moderate joint effusion. No acute fracture or dislocation.   IMPRESSION: Status post total right knee arthroplasty without evidence of hardware failure.     Electronically Signed   By: Neita Garnet M.D.   On: 11/23/2022 16:54     PATIENT SURVEYS:  FOTO 31/100 with target of 58   COGNITION: Overall cognitive status: Within functional limits for tasks assessed                         SENSATION: WFL   EDEMA:  Circumferential: R 17.5 inch and L 18 inch    MUSCLE  LENGTH: Hamstrings: Right NT ; Left NT   Thomas test: Right NT deg; Left NT    POSTURE: No Significant postural limitations   PALPATION: Patella of right knee    LOWER EXTREMITY ROM:   AROM/PROM Right eval Left eval  Hip flexion      Hip extension      Hip abduction      Hip adduction      Hip internal rotation      Hip external rotation      Knee flexion 60/80* 130/130  Knee extension 15/15* 0/0  Ankle dorsiflexion      Ankle plantarflexion      Ankle inversion      Ankle eversion       (Blank rows = not tested)   LOWER EXTREMITY MMT:   MMT Right eval Left eval  Hip flexion 3 5  Hip extension      Hip abduction      Hip adduction      Hip internal rotation      Hip external rotation      Knee flexion 5 5  Knee extension NT  5  Ankle dorsiflexion      Ankle plantarflexion      Ankle inversion      Ankle eversion       (Blank rows = not tested)   LOWER EXTREMITY SPECIAL TESTS: None performed      FUNCTIONAL TESTS: None performed    GAIT: Distance walked: 50 ft  Assistive device utilized: Walker - 2 wheeled Level of assistance: Modified independence Comments: Decreased stance time on RLE and step length on LLE.      TODAY'S TREATMENT:  DATE:    12/02/22: Nu-Step seat and arms level 11 with no resistance   Adjusted RW for patient to proper wrist level and discussed need to buy new RW because his was too small  Seated Right Knee Flex AAROM 3 x 10  Supine Short Arch Quad on RLE x 1  -Unable to perform due to quad weakness   Reviewed leg lifter and where to purchase on [employer] to help with lifting leg  Standing TKE on RLE with yellow band 1 x 10  Standing TKE on RLE  with yellow band 1 x 10     11/25/22  All performed on RLE  Quad Sets for 5 sec hold 1 x 10  Glute Sets for 5 sec hold 1 x 10  Heel Slides 1 x 10         PATIENT EDUCATION:  Education details: form and technique for appropriate exercise and explanation about modalities for decreased swelling and pain relief  Person educated: Patient and wife  Education method: Consulting civil engineer, Demonstration, Verbal cues, and Handouts Education comprehension: verbalized understanding, returned demonstration, and verbal cues required   HOME EXERCISE PROGRAM: Access Code: [email or web address] URL: [email or web address] Date: 12/02/2022 Prepared by: Bradly Chris  Exercises - Supine Quad Set  - 3 x weekly - 3 sets - 10 reps - 5-10 sec  hold - Supine Heel Slide  - 3 x weekly - 3 sets - 10 reps - Supine Hip Abduction  - 3 x weekly - 3 sets - 10 reps - Supine Gluteal Sets  - 3 x weekly - 3 sets - 10 reps - 5-10 sec hold - Seated Knee Flexion AAROM  - 1 x daily - 3 sets - 10 reps - 1-2 sec  hold - Supine Ankle Pumps  - 1 x daily - 3 sets - 10 reps - Standing Terminal Knee Extension with Resistance  - 3 x weekly - 3 sets - 10 reps   ASSESSMENT:   CLINICAL IMPRESSION:  Pt presents for first f/u session s/p 9 days after right TKA. He continues to have limited right quad strength with inability to perform short arch quad as well as decreased knee mobility with difficulty flexing knee. He did show improvement with standing tolerance with ability to perform TKE in standing.  He will benefit from skilled to regain right knee mobility and strength to return to walking without an assistive device and bending and stooping to complete job related tasks.      OBJECTIVE IMPAIRMENTS: Abnormal gait, decreased balance, decreased endurance, decreased mobility, difficulty walking, decreased ROM, decreased strength, increased edema, and pain.    ACTIVITY LIMITATIONS: bending, standing, squatting, stairs, bed mobility, dressing, and locomotion level   PARTICIPATION LIMITATIONS: laundry, shopping, community activity, occupation, and yard work   PERSONAL FACTORS: Age,  Profession, and 1 comorbidity: HTN   are also affecting patient's functional outcome.    REHAB POTENTIAL: Good   CLINICAL DECISION MAKING: Stable/uncomplicated   EVALUATION COMPLEXITY: Low     GOALS: Goals reviewed with patient? No   SHORT TERM GOALS: Target date: 12/09/2022   Pt will be independent with HEP in order to improve strength and balance in order to decrease fall risk and improve function at home and work. Baseline: Given initial HEP  Goal status: INITIAL   2.  Patient will demonstrate understanding of scar tissue massage technique and perform independently.  Baseline: Awaiting bandage removal Goal status: INITIAL     LONG TERM GOALS: Target date: 02/03/2023   Patient  will have improved function and activity level as evidenced by an increase in FOTO score by 10 points or more.  Baseline:  Goal status: INITIAL   2.  Patient will improve right knee ROM flexion and extension to be nearly symmetrical to left knee AROM.   Baseline: Knee Flex R/L 60*/130, Knee Ext R/L 0*/15  Goal status: INITIAL   3.  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  Baseline: Right knee extension unable to test, Left knee extension 5/5 Goal status: INITIAL   4.  Patient will return to walking without an assistive device as a sign of right knee healing.  Baseline: NT  Goal status: INITIAL       PLAN:   PT FREQUENCY: 1-2x/week   PT DURATION: 10 weeks   PLANNED INTERVENTIONS: Therapeutic exercises, Neuromuscular re-education, Balance training, Gait training, Patient/Family education, Self Care, Joint mobilization, Joint manipulation, DME instructions, Electrical stimulation, Manual therapy, and Re-evaluation, Dry needling, Cryotherapy, Aquatic therapy    PLAN FOR NEXT SESSION: Progress knee mobility strengthening and mobility exercises: SAQ, Seated knee flexion, etc   Bradly Chris  PT, DPT  12/02/2022, 3:54 PM

## 2022-12-08 ENCOUNTER — Ambulatory Visit: Payer: Medicare HMO | Attending: Orthopedic Surgery | Admitting: Physical Therapy

## 2022-12-08 DIAGNOSIS — Z96651 Presence of right artificial knee joint: Secondary | ICD-10-CM | POA: Insufficient documentation

## 2022-12-08 DIAGNOSIS — M25561 Pain in right knee: Secondary | ICD-10-CM | POA: Diagnosis present

## 2022-12-08 NOTE — Therapy (Signed)
OUTPATIENT PHYSICAL THERAPY TREATMENT NOTE   Patient Name: Sergio [name] MRN: [id number] DOB:08-05-1951, 72 y.o., male Today's Date: 12/08/2022  PCP: Dr. Marlowe Sax  REFERRING PROVIDER: Dr. Charlies Constable   END OF SESSION:   PT End of Session - 12/08/22 1421     Visit Number 3    Number of Visits 20    Date for PT Re-Evaluation 02/03/23    Authorization Type [employer]    Authorization Time Period 11/25/22-02/03/23    Authorization - Visit Number 3    Authorization - Number of Visits 20    Progress Note Due on Visit 10    PT Start Time [id number]    PT Stop Time 1415    PT Time Calculation (min) 40 min    Activity Tolerance Patient tolerated treatment well;Patient limited by pain    Behavior During Therapy [facility] for tasks assessed/performed              Past Medical History:  Diagnosis Date   Arthritis    Enlarged prostate    Erectile dysfunction    History of colonoscopy    History of COVID-19    Hypertension    Low testosterone    Low testosterone    Pneumonia    2022   Past Surgical History:  Procedure Laterality Date   COLONOSCOPY     RETINAL DETACHMENT SURGERY Right    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   Procedure: TOTAL KNEE ARTHROPLASTY;  Surgeon: Willaim Sheng, MD;  Location: [facility];  Service: Orthopedics;  Laterality: Right;   WISDOM TOOTH EXTRACTION     Patient Active Problem List   Diagnosis Date Noted   Localized osteoarthritis of right knee 11/23/2022   Nonspecific abnormal electrocardiogram (ECG) (EKG) 10/06/2022   Pre-op evaluation 10/06/2022   Essential hypertension 07/09/2021   Erectile dysfunction 07/09/2021   Benign prostatic hyperplasia without lower urinary tract symptoms 07/09/2021   Secondary erythrocytosis 10/25/2018    REFERRING DIAG: S/p right total knee   THERAPY DIAG:  Acute pain of right knee  Hx of total knee arthroplasty, right  Rationale for Evaluation and Treatment Rehabilitation  PERTINENT  HISTORY: Pt started experiencing right knee pain in February at which point he went to [place] and received an injection. He continued to experience pain even after the injection. Eventually it was advised for him to receive a total knee replacement and he is now POD #2. He has been discharged with packet of information outlining what he should do as far as icing knee and exercises.     PRECAUTIONS: None   SUBJECTIVE:  SUBJECTIVE STATEMENT:  Pt reports that he just saw physician who said he is on track. He got a new rolling walker from his sister that fits him better.    PAIN:  Are you having pain? Yes: NPRS scale: 4-5/10 Pain location: Right knee joint line  Pain description: Aching  Aggravating factors: Straightening knee  Relieving factors: Keep still and bending it a little bit    OBJECTIVE: (objective measures completed at initial evaluation unless otherwise dated)   VITALS: BP 139/89 HR 80 SpO2 79    DIAGNOSTIC FINDINGS:    CLINICAL DATA:  Status post total right knee arthroplasty.   EXAM: PORTABLE RIGHT KNEE - 1-2 VIEW   COMPARISON:  None Available.   FINDINGS: Status post recent total right knee arthroplasty. No perihardware lucency is seen to indicate hardware failure or loosening. Expected postoperative intra-articular and subcutaneous air. Mild-to-moderate joint effusion. No acute fracture or dislocation.   IMPRESSION: Status post total right knee arthroplasty without evidence of hardware failure.     Electronically Signed   By: Yvonne Kendall M.D.   On: 11/23/2022 16:54     PATIENT SURVEYS:  FOTO 31/100 with target of 58   COGNITION: Overall cognitive status: Within functional limits for tasks assessed                         SENSATION: WFL   EDEMA:  Circumferential: R 17.5  inch and L 18 inch    MUSCLE LENGTH: Hamstrings: Right NT ; Left NT   Thomas test: Right NT deg; Left NT    POSTURE: No Significant postural limitations   PALPATION: Patella of right knee    LOWER EXTREMITY ROM:   AROM/PROM Right eval Left eval  Hip flexion      Hip extension      Hip abduction      Hip adduction      Hip internal rotation      Hip external rotation      Knee flexion 60/80* 130/130  Knee extension 15/15* 0/0  Ankle dorsiflexion      Ankle plantarflexion      Ankle inversion      Ankle eversion       (Blank rows = not tested)   LOWER EXTREMITY MMT:   MMT Right eval Left eval  Hip flexion 3 5  Hip extension      Hip abduction      Hip adduction      Hip internal rotation      Hip external rotation      Knee flexion 5 5  Knee extension NT  5  Ankle dorsiflexion      Ankle plantarflexion      Ankle inversion      Ankle eversion       (Blank rows = not tested)   LOWER EXTREMITY SPECIAL TESTS: None performed      FUNCTIONAL TESTS: None performed    GAIT: Distance walked: 50 ft  Assistive device utilized: Walker - 2 wheeled Level of assistance: Modified independence Comments: Decreased stance time on RLE and step length on LLE.      TODAY'S TREATMENT:  DATE:    12/08/22 Nu-Step seat and arms level 11 with no resistance   SLR on RLE  3 x 5  -Slight knee bend on RLE   Supine Hip Abduction RLE 1 x 5 -Pt able to lift RLE with quad activation   Side Lying Hip Abduction RLE 1 x 10  Side Lying Hip Abduction RLE with #2 AW 2 x 10  -min VC to maintain knee extension   Heel Slides with strap to provide additional knee flexion on RLE 3 x 10  -Attempted with gait belt and around ankle and foot   12/02/22: Nu-Step seat and arms level 11 with no resistance   Adjusted RW for patient to proper wrist level and discussed  need to buy new RW because his was too small  Seated Right Knee Flex AAROM 3 x 10  Supine Short Arch Quad on RLE x 1  -Unable to perform due to quad weakness   Reviewed leg lifter and where to purchase on [employer] to help with lifting leg  Standing TKE on RLE with yellow band 1 x 10  Standing TKE on RLE  with yellow band 1 x 10     11/25/22  All performed on RLE  Quad Sets for 5 sec hold 1 x 10  Glute Sets for 5 sec hold 1 x 10  Heel Slides 1 x 10        PATIENT EDUCATION:  Education details: form and technique for appropriate exercise and explanation about modalities for decreased swelling and pain relief  Person educated: Patient and wife  Education method: Consulting civil engineer, Media planner, Verbal cues, and Handouts Education comprehension: verbalized understanding, returned demonstration, and verbal cues required   HOME EXERCISE PROGRAM: Access Code: [email or web address] URL: [email or web address] Date: 12/08/2022 Prepared by: Bradly Chris  Exercises - Supine Quad Set  - 3 x weekly - 3 sets - 10 reps - 5-10 sec  hold - Supine Gluteal Sets  - 3 x weekly - 3 sets - 10 reps - 5-10 sec hold - Seated Knee Flexion AAROM  - 1 x daily - 3 sets - 10 reps - 1-2 sec  hold - Supine Ankle Pumps  - 1 x daily - 3 sets - 10 reps - Standing Terminal Knee Extension with Resistance  - 3 x weekly - 3 sets - 10 reps - Sidelying Hip Abduction  - 3 x weekly - 3 sets - 10 reps - Supine Heel Slide with Strap  - 1 x daily - 2 sets - 20 reps - Supine Active Straight Leg Raise  - 3 x weekly - 3 sets - 10 reps   ASSESSMENT:   CLINICAL IMPRESSION:  Pt presents for first f/u session s/p 14 days after right TKA. Pt shows improvement with right knee ROM and strength with ability to perform 80 deg knee flex AROM and straight leg with slight knee bend. He will continue to benefit from skilled to regain right knee mobility and strength to return to walking without an assistive device and bending and stooping  to complete job related tasks.     OBJECTIVE IMPAIRMENTS: Abnormal gait, decreased balance, decreased endurance, decreased mobility, difficulty walking, decreased ROM, decreased strength, increased edema, and pain.    ACTIVITY LIMITATIONS: bending, standing, squatting, stairs, bed mobility, dressing, and locomotion level   PARTICIPATION LIMITATIONS: laundry, shopping, community activity, occupation, and yard work   PERSONAL FACTORS: Age, Profession, and 1 comorbidity: HTN   are also affecting patient's functional outcome.    REHAB  POTENTIAL: Good   CLINICAL DECISION MAKING: Stable/uncomplicated   EVALUATION COMPLEXITY: Low     GOALS: Goals reviewed with patient? No   SHORT TERM GOALS: Target date: 12/09/2022   Pt will be independent with HEP in order to improve strength and balance in order to decrease fall risk and improve function at home and work. Baseline: Given initial HEP  Goal status: ONGOING    2.  Patient will demonstrate understanding of scar tissue massage technique and perform independently.  Baseline: Awaiting scaps to heal  Goal status: ONGOING     LONG TERM GOALS: Target date: 02/03/2023   Patient will have improved function and activity level as evidenced by an increase in FOTO score by 10 points or more.  Baseline: 31  Goal status: ONGOING    2.  Patient will improve right knee ROM flexion and extension to be nearly symmetrical to left knee AROM.   Baseline: Knee Flex R/L 60*/130, Knee Ext R/L 0*/15  Goal status: ONGOING    3.  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  Baseline: Right knee extension unable to test, Left knee extension 5/5 Goal status: ONGOING    4.  Patient will return to walking without an assistive device as a sign of right knee healing.  Baseline: Ambulating with RW   Goal status: ONGOING        PLAN:   PT FREQUENCY:  1-2x/week   PT DURATION: 10 weeks   PLANNED INTERVENTIONS: Therapeutic exercises, Neuromuscular re-education, Balance training, Gait training, Patient/Family education, Self Care, Joint mobilization, Joint manipulation, DME instructions, Electrical stimulation, Manual therapy, and Re-evaluation, Dry needling, Cryotherapy, Aquatic therapy    PLAN FOR NEXT SESSION: Progress knee mobility strengthening and mobility exercises: SAQ, standing exercises?  Bradly Chris PT, DPT  12/08/2022, 2:22 PM

## 2022-12-10 ENCOUNTER — Encounter: Payer: Self-pay | Admitting: Physical Therapy

## 2022-12-10 ENCOUNTER — Ambulatory Visit: Payer: Medicare HMO | Admitting: Physical Therapy

## 2022-12-10 DIAGNOSIS — M25561 Pain in right knee: Secondary | ICD-10-CM | POA: Diagnosis not present

## 2022-12-10 DIAGNOSIS — Z96651 Presence of right artificial knee joint: Secondary | ICD-10-CM

## 2022-12-10 NOTE — Therapy (Signed)
OUTPATIENT PHYSICAL THERAPY TREATMENT NOTE   Patient Name: Sergio [name] MRN: [id number] DOB:Mar 05, 1951, 72 y.o., male Today's Date: 12/10/2022  PCP: Dr. Marlowe Sax  REFERRING PROVIDER: Dr. Charlies Constable   END OF SESSION:   PT End of Session - 12/10/22 1357     Visit Number 4    Number of Visits 20    Date for PT Re-Evaluation 02/03/23    Authorization Type [employer]    Authorization Time Period 11/25/22-02/03/23    Authorization - Visit Number 4    Authorization - Number of Visits 20    Progress Note Due on Visit 10    PT Start Time 1330    PT Stop Time 1410    PT Time Calculation (min) 40 min    Activity Tolerance Patient tolerated treatment well;Patient limited by pain    Behavior During Therapy [facility] for tasks assessed/performed               Past Medical History:  Diagnosis Date   Arthritis    Enlarged prostate    Erectile dysfunction    History of colonoscopy    History of COVID-19    Hypertension    Low testosterone    Low testosterone    Pneumonia    2022   Past Surgical History:  Procedure Laterality Date   COLONOSCOPY     RETINAL DETACHMENT SURGERY Right    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   Procedure: TOTAL KNEE ARTHROPLASTY;  Surgeon: Willaim Sheng, MD;  Location: [facility];  Service: Orthopedics;  Laterality: Right;   WISDOM TOOTH EXTRACTION     Patient Active Problem List   Diagnosis Date Noted   Localized osteoarthritis of right knee 11/23/2022   Nonspecific abnormal electrocardiogram (ECG) (EKG) 10/06/2022   Pre-op evaluation 10/06/2022   Essential hypertension 07/09/2021   Erectile dysfunction 07/09/2021   Benign prostatic hyperplasia without lower urinary tract symptoms 07/09/2021   Secondary erythrocytosis 10/25/2018    REFERRING DIAG: S/p right total knee   THERAPY DIAG:  Acute pain of right knee  Hx of total knee arthroplasty, right  Rationale for Evaluation and Treatment Rehabilitation  PERTINENT  HISTORY: Pt started experiencing right knee pain in February at which point he went to [place] and received an injection. He continued to experience pain even after the injection. Eventually it was advised for him to receive a total knee replacement and he is now POD #2. He has been discharged with packet of information outlining what he should do as far as icing knee and exercises.     PRECAUTIONS: None   SUBJECTIVE:  SUBJECTIVE STATEMENT:  Pt reports that he just saw physician who said he is on track. He got a new rolling walker from his sister that fits him better.    PAIN:  Are you having pain? Yes: NPRS scale: 4-5/10 Pain location: Right knee joint line  Pain description: Aching  Aggravating factors: Straightening knee  Relieving factors: Keep still and bending it a little bit    OBJECTIVE: (objective measures completed at initial evaluation unless otherwise dated)   VITALS: BP 139/89 HR 80 SpO2 79    DIAGNOSTIC FINDINGS:    CLINICAL DATA:  Status post total right knee arthroplasty.   EXAM: PORTABLE RIGHT KNEE - 1-2 VIEW   COMPARISON:  None Available.   FINDINGS: Status post recent total right knee arthroplasty. No perihardware lucency is seen to indicate hardware failure or loosening. Expected postoperative intra-articular and subcutaneous air. Mild-to-moderate joint effusion. No acute fracture or dislocation.   IMPRESSION: Status post total right knee arthroplasty without evidence of hardware failure.     Electronically Signed   By: Yvonne Kendall M.D.   On: 11/23/2022 16:54     PATIENT SURVEYS:  FOTO 31/100 with target of 58   COGNITION: Overall cognitive status: Within functional limits for tasks assessed                         SENSATION: WFL   EDEMA:  Circumferential: R 17.5  inch and L 18 inch    MUSCLE LENGTH: Hamstrings: Right NT ; Left NT   Thomas test: Right NT deg; Left NT    POSTURE: No Significant postural limitations   PALPATION: Patella of right knee    LOWER EXTREMITY ROM:   AROM/PROM Right eval Left eval  Hip flexion      Hip extension      Hip abduction      Hip adduction      Hip internal rotation      Hip external rotation      Knee flexion 60/80* 130/130  Knee extension 15/15* 0/0  Ankle dorsiflexion      Ankle plantarflexion      Ankle inversion      Ankle eversion       (Blank rows = not tested)   LOWER EXTREMITY MMT:   MMT Right eval Left eval  Hip flexion 3 5  Hip extension      Hip abduction      Hip adduction      Hip internal rotation      Hip external rotation      Knee flexion 5 5  Knee extension NT  5  Ankle dorsiflexion      Ankle plantarflexion      Ankle inversion      Ankle eversion       (Blank rows = not tested)   LOWER EXTREMITY SPECIAL TESTS: None performed      FUNCTIONAL TESTS: None performed    GAIT: Distance walked: 50 ft  Assistive device utilized: Walker - 2 wheeled Level of assistance: Modified independence Comments: Decreased stance time on RLE and step length on LLE.      TODAY'S TREATMENT:  DATE:    12/10/22  THEREX   Nu-Step seat and arms level 7 with no resistance for 5 min  Long Arc Quads on RLE 1 X 5 -Unable to perform without hip flexion compensation  Supine Short Arc Quads on RLE 3 x 10   -8 inch bolster  Prone Extension on RLE 3 x 10   Right Side Lying Hip Adduction 3 x 10   MANUAL   Trigger point release of right quad  Quad massage with tension around lateral quad fibers  12/08/22 Nu-Step seat and arms level 11 with no resistance   SLR on RLE  3 x 5  -Slight knee bend on RLE   Supine Hip Abduction RLE 1 x 5 -Pt able to lift RLE with  quad activation   Side Lying Hip Abduction RLE 1 x 10  Side Lying Hip Abduction RLE with #2 AW 2 x 10  -min VC to maintain knee extension   Heel Slides with strap to provide additional knee flexion on RLE 3 x 10  -Attempted with gait belt and around ankle and foot   12/02/22: Nu-Step seat and arms level 11 with no resistance   Adjusted RW for patient to proper wrist level and discussed need to buy new RW because his was too small  Seated Right Knee Flex AAROM 3 x 10  Supine Short Arch Quad on RLE x 1  -Unable to perform due to quad weakness   Reviewed leg lifter and where to purchase on [employer] to help with lifting leg  Standing TKE on RLE with yellow band 1 x 10  Standing TKE on RLE  with yellow band 1 x 10     11/25/22  All performed on RLE  Quad Sets for 5 sec hold 1 x 10  Glute Sets for 5 sec hold 1 x 10  Heel Slides 1 x 10        PATIENT EDUCATION:  Education details: form and technique for appropriate exercise and explanation about modalities for decreased swelling and pain relief  Person educated: Patient and wife  Education method: Programmer, multimedia, Facilities manager, Verbal cues, and Handouts Education comprehension: verbalized understanding, returned demonstration, and verbal cues required   HOME EXERCISE PROGRAM: Access Code: [email or web address] URL: [email or web address] Date: 12/10/2022 Prepared by: Ellin Goodie  Exercises - Seated Knee Flexion AAROM  - 1 x daily - 3 sets - 10 reps - 1-2 sec  hold - Supine Ankle Pumps  - 1 x daily - 3 sets - 10 reps - Sidelying Hip Abduction  - 3 x weekly - 3 sets - 10 reps - Supine Heel Slide with Strap  - 1 x daily - 2 sets - 20 reps - Supine Active Straight Leg Raise  - 3 x weekly - 3 sets - 10 reps - Supine Short Arc Quad  - 3 x weekly - 3 sets - 10 reps - Prone Hip Extension  - 3 x weekly - 3 sets - 10 reps - Sidelying Hip Adduction  - 3 x weekly - 3 sets - 10 reps - Standing Terminal Knee Extension with Resistance  -  3 x weekly - 3 sets - 10 reps   ASSESSMENT:   CLINICAL IMPRESSION:  Pt presents 17 days s/p right TKA. He continues to show restrictions with knee ROM and strength. He is still unable to complete long arc quad on RLE with ongoing right quad weakness. He does show improvement with right quad strength with ability to perform short arc  quad and to maintain knee extension throughout hip 4 ways. He will continue to benefit from skilled to regain right knee mobility and strength to return to walking without an assistive device and bending and stooping to complete job related tasks.     OBJECTIVE IMPAIRMENTS: Abnormal gait, decreased balance, decreased endurance, decreased mobility, difficulty walking, decreased ROM, decreased strength, increased edema, and pain.    ACTIVITY LIMITATIONS: bending, standing, squatting, stairs, bed mobility, dressing, and locomotion level   PARTICIPATION LIMITATIONS: laundry, shopping, community activity, occupation, and yard work   PERSONAL FACTORS: Age, Profession, and 1 comorbidity: HTN   are also affecting patient's functional outcome.    REHAB POTENTIAL: Good   CLINICAL DECISION MAKING: Stable/uncomplicated   EVALUATION COMPLEXITY: Low     GOALS: Goals reviewed with patient? No   SHORT TERM GOALS: Target date: 12/09/2022   Pt will be independent with HEP in order to improve strength and balance in order to decrease fall risk and improve function at home and work. Baseline: Given initial HEP  Goal status: ONGOING    2.  Patient will demonstrate understanding of scar tissue massage technique and perform independently.  Baseline: Awaiting scaps to heal  Goal status: ONGOING     LONG TERM GOALS: Target date: 02/03/2023   Patient will have improved function and activity level as evidenced by an increase in FOTO score by 10 points or more.  Baseline: 31  Goal status: ONGOING    2.  Patient will improve right knee ROM flexion and extension to be nearly  symmetrical to left knee AROM.   Baseline: Knee Flex R/L 60*/130, Knee Ext R/L 0*/15  Goal status: ONGOING    3.  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  Baseline: Right knee extension unable to test, Left knee extension 5/5 Goal status: ONGOING    4.  Patient will return to walking without an assistive device as a sign of right knee healing.  Baseline: Ambulating with RW   Goal status: ONGOING        PLAN:   PT FREQUENCY: 1-2x/week   PT DURATION: 10 weeks   PLANNED INTERVENTIONS: Therapeutic exercises, Neuromuscular re-education, Balance training, Gait training, Patient/Family education, Self Care, Joint mobilization, Joint manipulation, DME instructions, Electrical stimulation, Manual therapy, and Re-evaluation, Dry needling, Cryotherapy, Aquatic therapy    PLAN FOR NEXT SESSION: Measure knee ROM and strength. Start working on walking mechanics: heel to toe and use of SPC.   Ellin Goodie PT, DPT  12/10/2022, 1:58 PM

## 2022-12-14 ENCOUNTER — Encounter: Payer: Self-pay | Admitting: Physical Therapy

## 2022-12-14 ENCOUNTER — Ambulatory Visit: Payer: Medicare HMO | Admitting: Physical Therapy

## 2022-12-14 DIAGNOSIS — M25561 Pain in right knee: Secondary | ICD-10-CM

## 2022-12-14 DIAGNOSIS — Z96651 Presence of right artificial knee joint: Secondary | ICD-10-CM

## 2022-12-14 NOTE — Therapy (Signed)
OUTPATIENT PHYSICAL THERAPY TREATMENT NOTE   Patient Name: Sergio [name] MRN: [id number] DOB:[birth year], 72 y.o., male Today's Date: 12/15/2022  PCP: Dr. Marlowe Sax  REFERRING PROVIDER: Dr. Charlies Constable   END OF SESSION:   PT End of Session - 12/14/22 1335     Visit Number 5    Number of Visits 20    Date for PT Re-Evaluation 02/03/23    Authorization Type [employer]    Authorization Time Period 11/25/22-02/03/23    Authorization - Visit Number 5    Authorization - Number of Visits 20    Progress Note Due on Visit 10    PT Start Time 1330    PT Stop Time 4401    PT Time Calculation (min) 45 min    Activity Tolerance Patient tolerated treatment well;Patient limited by pain    Behavior During Therapy [facility] for tasks assessed/performed               Past Medical History:  Diagnosis Date   Arthritis    Enlarged prostate    Erectile dysfunction    History of colonoscopy    History of COVID-19    Hypertension    Low testosterone    Low testosterone    Pneumonia    2022   Past Surgical History:  Procedure Laterality Date   COLONOSCOPY     RETINAL DETACHMENT SURGERY Right    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   Procedure: TOTAL KNEE ARTHROPLASTY;  Surgeon: Willaim Sheng, MD;  Location: [facility];  Service: Orthopedics;  Laterality: Right;   WISDOM TOOTH EXTRACTION     Patient Active Problem List   Diagnosis Date Noted   Localized osteoarthritis of right knee 11/23/2022   Nonspecific abnormal electrocardiogram (ECG) (EKG) 10/06/2022   Pre-op evaluation 10/06/2022   Essential hypertension 07/09/2021   Erectile dysfunction 07/09/2021   Benign prostatic hyperplasia without lower urinary tract symptoms 07/09/2021   Secondary erythrocytosis 10/25/2018    REFERRING DIAG: S/p right total knee   THERAPY DIAG:  Acute pain of right knee  Hx of total knee arthroplasty, right  Rationale for Evaluation and Treatment Rehabilitation  PERTINENT  HISTORY: Pt started experiencing right knee pain in February at which point he went to [place] and received an injection. He continued to experience pain even after the injection. Eventually it was advised for him to receive a total knee replacement and he is now POD #2. He has been discharged with packet of information outlining what he should do as far as icing knee and exercises.     PRECAUTIONS: None   SUBJECTIVE:  SUBJECTIVE STATEMENT:  Pt states that he continues to do exercises without much difficulty.    PAIN:  Are you having pain? Yes: NPRS scale: 4-5/10 Pain location: Right knee joint line  Pain description: Aching  Aggravating factors: Straightening knee  Relieving factors: Keep still and bending it a little bit    OBJECTIVE: (objective measures completed at initial evaluation unless otherwise dated)   VITALS: BP 139/89 HR 80 SpO2 79    DIAGNOSTIC FINDINGS:    CLINICAL DATA:  Status post total right knee arthroplasty.   EXAM: PORTABLE RIGHT KNEE - 1-2 VIEW   COMPARISON:  None Available.   FINDINGS: Status post recent total right knee arthroplasty. No perihardware lucency is seen to indicate hardware failure or loosening. Expected postoperative intra-articular and subcutaneous air. Mild-to-moderate joint effusion. No acute fracture or dislocation.   IMPRESSION: Status post total right knee arthroplasty without evidence of hardware failure.     Electronically Signed   By: Neita Garnet M.D.   On: 11/23/2022 16:54     PATIENT SURVEYS:  FOTO 31/100 with target of 58   COGNITION: Overall cognitive status: Within functional limits for tasks assessed                         SENSATION: WFL   EDEMA:  Circumferential: R 17.5 inch and L 18 inch    MUSCLE LENGTH: Hamstrings: Right NT ;  Left NT   Thomas test: Right NT deg; Left NT    POSTURE: No Significant postural limitations   PALPATION: Patella of right knee    LOWER EXTREMITY ROM:   AROM/PROM Right eval Left eval  Hip flexion      Hip extension      Hip abduction      Hip adduction      Hip internal rotation      Hip external rotation      Knee flexion 60/80* 130/130  Knee extension 15/15* 0/0  Ankle dorsiflexion      Ankle plantarflexion      Ankle inversion      Ankle eversion       (Blank rows = not tested)   LOWER EXTREMITY MMT:   MMT Right eval Left eval  Hip flexion 3 5  Hip extension      Hip abduction      Hip adduction      Hip internal rotation      Hip external rotation      Knee flexion 5 5  Knee extension NT  5  Ankle dorsiflexion      Ankle plantarflexion      Ankle inversion      Ankle eversion       (Blank rows = not tested)   LOWER EXTREMITY SPECIAL TESTS: None performed      FUNCTIONAL TESTS: None performed    GAIT: Distance walked: 50 ft  Assistive device utilized: Walker - 2 wheeled Level of assistance: Modified independence Comments: Decreased stance time on RLE and step length on LLE.      TODAY'S TREATMENT:  DATE:    12/14/22  THEREX  Nu-Step seat and arms level 8 with no resistance for 5 min  Right Knee Flexion AAROM with heel slider under foot 3 x 10    Gait Training   Walking Exercises: [email or web address]  Heel contact on RLE with 1 UE support 2 x 10  -mod VC for step length and to maintain upright posture   Swing phase on RLE with 1 UE support 1 x 10   10 m x 10 with SPC  -mod VC for sequencing of step to gait pattern   Ambulating outside over mulch outside with SPC 50 ft x 2  Down 1 ft curb and up 1 ft curb  -min VC for foot placement, down with bad and up with good    12/10/22  THEREX    Nu-Step seat and arms level 7 with no resistance for 5 min  Long Arc Quads on RLE 1 X 5 -Unable to perform without hip flexion compensation  Supine Short Arc Quads on RLE 3 x 10   -8 inch bolster  Prone Extension on RLE 3 x 10   Right Side Lying Hip Adduction 3 x 10   MANUAL   Trigger point release of right quad  Quad massage with tension around lateral quad fibers  12/08/22 Nu-Step seat and arms level 11 with no resistance   SLR on RLE  3 x 5  -Slight knee bend on RLE   Supine Hip Abduction RLE 1 x 5 -Pt able to lift RLE with quad activation   Side Lying Hip Abduction RLE 1 x 10  Side Lying Hip Abduction RLE with #2 AW 2 x 10  -min VC to maintain knee extension   Heel Slides with strap to provide additional knee flexion on RLE 3 x 10  -Attempted with gait belt and around ankle and foot   12/02/22: Nu-Step seat and arms level 11 with no resistance   Adjusted RW for patient to proper wrist level and discussed need to buy new RW because his was too small  Seated Right Knee Flex AAROM 3 x 10  Supine Short Arch Quad on RLE x 1  -Unable to perform due to quad weakness   Reviewed leg lifter and where to purchase on [employer] to help with lifting leg  Standing TKE on RLE with yellow band 1 x 10  Standing TKE on RLE  with yellow band 1 x 10     11/25/22  All performed on RLE  Quad Sets for 5 sec hold 1 x 10  Glute Sets for 5 sec hold 1 x 10  Heel Slides 1 x 10        PATIENT EDUCATION:  Education details: form and technique for appropriate exercise and explanation about modalities for decreased swelling and pain relief  Person educated: Patient and wife  Education method: Programmer, multimedia, Facilities manager, Verbal cues, and Handouts Education comprehension: verbalized understanding, returned demonstration, and verbal cues required   HOME EXERCISE PROGRAM: Access Code: [email or web address] URL: [email or web address] Date: 12/10/2022 Prepared by: Ellin Goodie  Exercises - Seated Knee Flexion AAROM  - 1 x daily - 3 sets - 10 reps - 1-2 sec  hold - Supine Ankle Pumps  - 1 x daily - 3 sets - 10 reps - Sidelying Hip Abduction  - 3 x weekly - 3 sets - 10 reps - Supine Heel Slide with Strap  - 1 x daily - 2 sets - 20 reps - Supine Active Straight  Leg Raise  - 3 x weekly - 3 sets - 10 reps - Supine Short Arc Quad  - 3 x weekly - 3 sets - 10 reps - Prone Hip Extension  - 3 x weekly - 3 sets - 10 reps - Sidelying Hip Adduction  - 3 x weekly - 3 sets - 10 reps - Standing Terminal Knee Extension with Resistance  - 3 x weekly - 3 sets - 10 reps   ASSESSMENT:   CLINICAL IMPRESSION:  Pt presents 3 weeks s/p right TKA. He shows improved mobility with ability to walk safely with use of SPC instead of rolling walker. Pt required moding cuing and increased tim to perform step through gait pattern consistently. He was able to walk over uneven surfaces safely with [facility] and he will attempt further walking with use of SPC at home. He will continue to benefit from skilled to regain right knee mobility and strength to return to walking without an assistive device and bending and stooping to complete job related tasks.      OBJECTIVE IMPAIRMENTS: Abnormal gait, decreased balance, decreased endurance, decreased mobility, difficulty walking, decreased ROM, decreased strength, increased edema, and pain.    ACTIVITY LIMITATIONS: bending, standing, squatting, stairs, bed mobility, dressing, and locomotion level   PARTICIPATION LIMITATIONS: laundry, shopping, community activity, occupation, and yard work   PERSONAL FACTORS: Age, Profession, and 1 comorbidity: HTN   are also affecting patient's functional outcome.    REHAB POTENTIAL: Good   CLINICAL DECISION MAKING: Stable/uncomplicated   EVALUATION COMPLEXITY: Low     GOALS: Goals reviewed with patient? No   SHORT TERM GOALS: Target date: 12/09/2022   Pt will be independent with HEP in order to improve  strength and balance in order to decrease fall risk and improve function at home and work. Baseline: Given initial HEP  Goal status: ONGOING    2.  Patient will demonstrate understanding of scar tissue massage technique and perform independently.  Baseline: Awaiting scaps to heal  Goal status: ONGOING     LONG TERM GOALS: Target date: 02/03/2023   Patient will have improved function and activity level as evidenced by an increase in FOTO score by 10 points or more.  Baseline: 31  Goal status: ONGOING    2.  Patient will improve right knee ROM flexion and extension to be nearly symmetrical to left knee AROM.   Baseline: Knee Flex R/L 60*/130, Knee Ext R/L 0*/15  Goal status: ONGOING    3.  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  Baseline: Right knee extension unable to test, Left knee extension 5/5 Goal status: ONGOING    4.  Patient will return to walking without an assistive device as a sign of right knee healing.  Baseline: Ambulating with RW   Goal status: ONGOING        PLAN:   PT FREQUENCY: 1-2x/week   PT DURATION: 10 weeks   PLANNED INTERVENTIONS: Therapeutic exercises, Neuromuscular re-education, Balance training, Gait training, Patient/Family education, Self Care, Joint mobilization, Joint manipulation, DME instructions, Electrical stimulation, Manual therapy, and Re-evaluation, Dry needling, Cryotherapy, Aquatic therapy    PLAN FOR NEXT SESSION: FOTO. Measure knee ROM and strength. Start working on walking mechanics: heel to toe and use of SPC. Practice walking up stairs with SPC. Knee hang   Ellin Goodie PT, DPT  12/15/2022, 11:12 AM

## 2022-12-17 ENCOUNTER — Ambulatory Visit: Payer: Medicare HMO

## 2022-12-17 DIAGNOSIS — M25561 Pain in right knee: Secondary | ICD-10-CM | POA: Diagnosis not present

## 2022-12-17 DIAGNOSIS — Z96651 Presence of right artificial knee joint: Secondary | ICD-10-CM

## 2022-12-17 NOTE — Therapy (Signed)
OUTPATIENT PHYSICAL THERAPY TREATMENT NOTE   Patient Name: Sergio [name] MRN: [id number] DOB:04-11-1951, 72 y.o., male Today's Date: 12/17/2022  PCP: Dr. Marlowe Sax  REFERRING PROVIDER: Dr. Charlies Constable   END OF SESSION:   PT End of Session - 12/17/22 1341     Visit Number 6    Number of Visits 20    Date for PT Re-Evaluation 02/03/23    Authorization Type [employer]    Authorization Time Period 11/25/22-02/03/23    Authorization - Visit Number 6    Authorization - Number of Visits 20    Progress Note Due on Visit 10    PT Start Time 1696    PT Stop Time 1411    PT Time Calculation (min) 39 min    Activity Tolerance Patient tolerated treatment well;Patient limited by pain    Behavior During Therapy [facility] for tasks assessed/performed               Past Medical History:  Diagnosis Date   Arthritis    Enlarged prostate    Erectile dysfunction    History of colonoscopy    History of COVID-19    Hypertension    Low testosterone    Low testosterone    Pneumonia    2022   Past Surgical History:  Procedure Laterality Date   COLONOSCOPY     RETINAL DETACHMENT SURGERY Right    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   Procedure: TOTAL KNEE ARTHROPLASTY;  Surgeon: Willaim Sheng, MD;  Location: [facility];  Service: Orthopedics;  Laterality: Right;   WISDOM TOOTH EXTRACTION     Patient Active Problem List   Diagnosis Date Noted   Localized osteoarthritis of right knee 11/23/2022   Nonspecific abnormal electrocardiogram (ECG) (EKG) 10/06/2022   Pre-op evaluation 10/06/2022   Essential hypertension 07/09/2021   Erectile dysfunction 07/09/2021   Benign prostatic hyperplasia without lower urinary tract symptoms 07/09/2021   Secondary erythrocytosis 10/25/2018    REFERRING DIAG: S/p right total knee   THERAPY DIAG:  Acute pain of right knee  Hx of total knee arthroplasty, right  Rationale for Evaluation and Treatment Rehabilitation  PERTINENT  HISTORY: Pt started experiencing right knee pain in February at which point he went to [place] and received an injection. He continued to experience pain even after the injection. Pt then went for TKA.    PRECAUTIONS: None   SUBJECTIVE:                                                                                                                                                                                      SUBJECTIVE STATEMENT:  Still quite tight  in the knee, HEP going well. Pt practiced with SPC yesterday at funeral, knee a little more sore.    PAIN:  Are you having pain? Yes: NPRS scale: 1/10 Pain location: Right knee joint line  Pain description: Aching  Aggravating factors: Straightening knee  Relieving factors: Keep still and bending it a little bit    OBJECTIVE: (objective measures completed at initial evaluation unless otherwise dated)      TODAY'S TREATMENT:                                                                                                                              DATE:    12/17/22  -AA/ROM: 3 minutes seat at 10, 1 minute seat 9, 1 minute 8 on Nustep -RLE 2nd step lunge stretch 4x30sec  -seated Hamstring -seated RLE heel slide A/ROM knee flexion/extension  -STS from c chair + airex 3x5, hands out forward stretched  -RLE SAQ over burgundy 4x10 (min-modA provided due to significant limitation remaining)   ROM this date: 22-86 degrees Rt knee flexion *discussion about better polar care temperature regulation to allow for wearing longer than 30 minutes. TKE stretch at home is uncomfortable and limiting.    12/14/22  THEREX Nu-Step seat and arms level 8 with no resistance for 5 min  Right Knee Flexion AAROM with heel slider under foot 3 x 10    Gait Training   Walking Exercises: [email or web address]  Heel contact on RLE with 1 UE support 2 x 10  -mod VC for step length and to maintain upright posture   Swing phase on RLE  with 1 UE support 1 x 10   10 m x 10 with SPC  -mod VC for sequencing of step to gait pattern   Ambulating outside over mulch outside with SPC 50 ft x 2  Down 1 ft curb and up 1 ft curb  -min VC for foot placement, down with bad and up with good    12/10/22  THEREX   Nu-Step seat and arms level 7 with no resistance for 5 min  Long Arc Quads on RLE 1 X 5 -Unable to perform without hip flexion compensation  Supine Short Arc Quads on RLE 3 x 10   -8 inch bolster  Prone Extension on RLE 3 x 10   Right Side Lying Hip Adduction 3 x 10   MANUAL   Trigger point release of right quad  Quad massage with tension around lateral quad fibers  12/08/22 Nu-Step seat and arms level 11 with no resistance   SLR on RLE  3 x 5  -Slight knee bend on RLE   Supine Hip Abduction RLE 1 x 5 -Pt able to lift RLE with quad activation   Side Lying Hip Abduction RLE 1 x 10  Side Lying Hip Abduction RLE with #2 AW 2 x 10  -min VC to maintain knee extension   Heel Slides with strap to provide additional knee  flexion on RLE 3 x 10  -Attempted with gait belt and around ankle and foot   12/02/22: Nu-Step seat and arms level 11 with no resistance   Adjusted RW for patient to proper wrist level and discussed need to buy new RW because his was too small  Seated Right Knee Flex AAROM 3 x 10  Supine Short Arch Quad on RLE x 1  -Unable to perform due to quad weakness   Reviewed leg lifter and where to purchase on [employer] to help with lifting leg  Standing TKE on RLE with yellow band 1 x 10  Standing TKE on RLE  with yellow band 1 x 10       PATIENT EDUCATION:  Education details: form and technique for appropriate exercise and explanation about modalities for decreased swelling and pain relief  Person educated: Patient and wife  Education method: Consulting civil engineer, Media planner, Verbal cues, and Handouts Education comprehension: verbalized understanding, returned demonstration, and verbal cues required    HOME EXERCISE PROGRAM: Access Code: [email or web address] URL: [email or web address] Date: 12/10/2022 Prepared by: Bradly Chris  Exercises - Seated Knee Flexion AAROM  - 1 x daily - 3 sets - 10 reps - 1-2 sec  hold - Supine Ankle Pumps  - 1 x daily - 3 sets - 10 reps - Sidelying Hip Abduction  - 3 x weekly - 3 sets - 10 reps - Supine Heel Slide with Strap  - 1 x daily - 2 sets - 20 reps - Supine Active Straight Leg Raise  - 3 x weekly - 3 sets - 10 reps - Supine Short Arc Quad  - 3 x weekly - 3 sets - 10 reps - Prone Hip Extension  - 3 x weekly - 3 sets - 10 reps - Sidelying Hip Adduction  - 3 x weekly - 3 sets - 10 reps - Standing Terminal Knee Extension with Resistance  - 3 x weekly - 3 sets - 10 reps   ASSESSMENT:   CLINICAL IMPRESSION:  Flexion ROM a little behind, but extension lag more concerning. Discussion on how to improve tolerance to stretching at home. Higher volume of isolated quads work in session today which remains weak and limited. Pt verbally confirms understanding of plan. He will continue to benefit from skilled to regain right knee mobility and strength to return to walking without an assistive device and bending and stooping to complete job related tasks.      OBJECTIVE IMPAIRMENTS: Abnormal gait, decreased balance, decreased endurance, decreased mobility, difficulty walking, decreased ROM, decreased strength, increased edema, and pain.    ACTIVITY LIMITATIONS: bending, standing, squatting, stairs, bed mobility, dressing, and locomotion level   PARTICIPATION LIMITATIONS: laundry, shopping, community activity, occupation, and yard work   PERSONAL FACTORS: Age, Profession, and 1 comorbidity: HTN   are also affecting patient's functional outcome.    REHAB POTENTIAL: Good   CLINICAL DECISION MAKING: Stable/uncomplicated   EVALUATION COMPLEXITY: Low     GOALS: Goals reviewed with patient? No   SHORT TERM GOALS: Target date: 12/09/2022   Pt will be  independent with HEP in order to improve strength and balance in order to decrease fall risk and improve function at home and work. Baseline: Given initial HEP  Goal status: ONGOING    2.  Patient will demonstrate understanding of scar tissue massage technique and perform independently.  Baseline: Awaiting scaps to heal  Goal status: ONGOING     LONG TERM GOALS: Target date: 02/03/2023   Patient will have improved function  and activity level as evidenced by an increase in FOTO score by 10 points or more.  Baseline: 31  Goal status: ONGOING    2.  Patient will improve right knee ROM flexion and extension to be nearly symmetrical to left knee AROM.   Baseline: Knee Flex R/L 60*/130, Knee Ext R/L 0*/15  Goal status: ONGOING    3.  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  Baseline: Right knee extension unable to test, Left knee extension 5/5 Goal status: ONGOING    4.  Patient will return to walking without an assistive device as a sign of right knee healing.  Baseline: Ambulating with RW   Goal status: ONGOING        PLAN:   PT FREQUENCY: 1-2x/week   PT DURATION: 10 weeks   PLANNED INTERVENTIONS: Therapeutic exercises, Neuromuscular re-education, Balance training, Gait training, Patient/Family education, Self Care, Joint mobilization, Joint manipulation, DME instructions, Electrical stimulation, Manual therapy, and Re-evaluation, Dry needling, Cryotherapy, Aquatic therapy    PLAN FOR NEXT SESSION: quads activation, ROM   1:50 PM, 12/17/22 Rosamaria Lints, PT, DPT Physical Therapist - [place] [phone] (Office)   12/17/2022, 1:50 PM

## 2022-12-21 ENCOUNTER — Encounter: Payer: Self-pay | Admitting: Physical Therapy

## 2022-12-21 ENCOUNTER — Ambulatory Visit: Payer: Medicare HMO | Admitting: Physical Therapy

## 2022-12-21 DIAGNOSIS — Z96651 Presence of right artificial knee joint: Secondary | ICD-10-CM

## 2022-12-21 DIAGNOSIS — M25561 Pain in right knee: Secondary | ICD-10-CM

## 2022-12-21 NOTE — Therapy (Signed)
OUTPATIENT PHYSICAL THERAPY TREATMENT NOTE   Patient Name: Sergio [name] MRN: [id number] DOB:04-27-1951, 72 y.o., male Today's Date: 12/21/2022  PCP: Dr. Richarda Blade  REFERRING PROVIDER: Dr. Weber Cooks   END OF SESSION:   PT End of Session - 12/21/22 1328     Visit Number 7    Number of Visits 20    Date for PT Re-Evaluation 02/03/23    Authorization Type [employer]    Authorization Time Period 11/25/22-02/03/23    Authorization - Visit Number 7    Authorization - Number of Visits 20    Progress Note Due on Visit 10    PT Start Time 1330    PT Stop Time 1415    PT Time Calculation (min) 45 min    Activity Tolerance Patient tolerated treatment well;Patient limited by pain    Behavior During Therapy [facility] for tasks assessed/performed               Past Medical History:  Diagnosis Date   Arthritis    Enlarged prostate    Erectile dysfunction    History of colonoscopy    History of COVID-19    Hypertension    Low testosterone    Low testosterone    Pneumonia    2022   Past Surgical History:  Procedure Laterality Date   COLONOSCOPY     RETINAL DETACHMENT SURGERY Right    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   Procedure: TOTAL KNEE ARTHROPLASTY;  Surgeon: Joen Laura, MD;  Location: [facility];  Service: Orthopedics;  Laterality: Right;   WISDOM TOOTH EXTRACTION     Patient Active Problem List   Diagnosis Date Noted   Localized osteoarthritis of right knee 11/23/2022   Nonspecific abnormal electrocardiogram (ECG) (EKG) 10/06/2022   Pre-op evaluation 10/06/2022   Essential hypertension 07/09/2021   Erectile dysfunction 07/09/2021   Benign prostatic hyperplasia without lower urinary tract symptoms 07/09/2021   Secondary erythrocytosis 10/25/2018    REFERRING DIAG: S/p right total knee   THERAPY DIAG:  Acute pain of right knee  Hx of total knee arthroplasty, right  Rationale for Evaluation and Treatment Rehabilitation  PERTINENT  HISTORY: Pt started experiencing right knee pain in February at which point he went to [place] and received an injection. He continued to experience pain even after the injection. Pt then went for TKA.    PRECAUTIONS: None   SUBJECTIVE:                                                                                                                                                                                      SUBJECTIVE STATEMENT:  Pt continues to  perform ROM and strengthening exercises. He is noticing some progress with being able to move leg up onto bed and moving leg back into flexion.   PAIN:  Are you having pain? Yes: NPRS scale: 1/10 Pain location: Right knee joint line  Pain description: Aching  Aggravating factors: Straightening knee  Relieving factors: Keep still and bending it a little bit    OBJECTIVE: (objective measures completed at initial evaluation unless otherwise dated)      TODAY'S TREATMENT:                                                                                                                              DATE:   12/21/22  Nu-Step level 9 for arms and feet with level 1 resistance for 5 min  Knee Ext R/L 15 deg/ 0 deg   Left Short Arc Quad PROM>AROM   NMES with performance of SAQ x 20 Wave[employer]: [place] Mode: Single   Intensity: 85 mA CC   Duration: 10 min  Cycle Time: 10/50   Ramp: 2 sec      12/17/22  -AA/ROM: 3 minutes seat at 10, 1 minute seat 9, 1 minute 8 on Nustep -RLE 2nd step lunge stretch 4x30sec  -seated Hamstring -seated RLE heel slide A/ROM knee flexion/extension  -STS from c chair + airex 3x5, hands out forward stretched  -RLE SAQ over burgundy 4x10 (min-modA provided due to significant limitation remaining)   ROM this date: 22-86 degrees Rt knee flexion *discussion about better polar care temperature regulation to allow for wearing longer than 30 minutes. TKE stretch at home is uncomfortable and limiting.     12/14/22  THEREX Nu-Step seat and arms level 8 with no resistance for 5 min  Right Knee Flexion AAROM with heel slider under foot 3 x 10    Gait Training   Walking Exercises: [email or web address]  Heel contact on RLE with 1 UE support 2 x 10  -mod VC for step length and to maintain upright posture   Swing phase on RLE with 1 UE support 1 x 10   10 m x 10 with SPC  -mod VC for sequencing of step to gait pattern   Ambulating outside over mulch outside with SPC 50 ft x 2  Down 1 ft curb and up 1 ft curb  -min VC for foot placement, down with bad and up with good    12/10/22  THEREX   Nu-Step seat and arms level 7 with no resistance for 5 min  Long Arc Quads on RLE 1 X 5 -Unable to perform without hip flexion compensation  Supine Short Arc Quads on RLE 3 x 10   -8 inch bolster  Prone Extension on RLE 3 x 10   Right Side Lying Hip Adduction 3 x 10   MANUAL   Trigger point release of right quad  Quad massage with tension around lateral quad fibers  12/08/22 Nu-Step seat and arms level  11 with no resistance   SLR on RLE  3 x 5  -Slight knee bend on RLE   Supine Hip Abduction RLE 1 x 5 -Pt able to lift RLE with quad activation   Side Lying Hip Abduction RLE 1 x 10  Side Lying Hip Abduction RLE with #2 AW 2 x 10  -min VC to maintain knee extension   Heel Slides with strap to provide additional knee flexion on RLE 3 x 10  -Attempted with gait belt and around ankle and foot   12/02/22: Nu-Step seat and arms level 11 with no resistance   Adjusted RW for patient to proper wrist level and discussed need to buy new RW because his was too small  Seated Right Knee Flex AAROM 3 x 10  Supine Short Arch Quad on RLE x 1  -Unable to perform due to quad weakness   Reviewed leg lifter and where to purchase on [employer] to help with lifting leg  Standing TKE on RLE with yellow band 1 x 10  Standing TKE on RLE  with yellow band 1 x 10       PATIENT  EDUCATION:  Education details: form and technique for appropriate exercise and explanation about modalities for decreased swelling and pain relief  Person educated: Patient and wife  Education method: Consulting civil engineer, Media planner, Verbal cues, and Handouts Education comprehension: verbalized understanding, returned demonstration, and verbal cues required   HOME EXERCISE PROGRAM: Access Code: [email or web address] URL: [email or web address] Date: 12/21/2022 Prepared by: Bradly Chris  Exercises - Seated Knee Flexion AAROM  - 1 x daily - 3 sets - 10 reps - 1-2 sec  hold - Supine Ankle Pumps  - 1 x daily - 3 sets - 10 reps - Sidelying Hip Abduction  - 3 x weekly - 3 sets - 10 reps - Supine Heel Slide with Strap  - 1 x daily - 2 sets - 20 reps - Supine Active Straight Leg Raise  - 3 x weekly - 3 sets - 10 reps - Supine Short Arc Quad  - 3 x weekly - 3 sets - 10 reps - Prone Hip Extension  - 3 x weekly - 3 sets - 10 reps - Sidelying Hip Adduction  - 3 x weekly - 3 sets - 10 reps - Standing Terminal Knee Extension with Resistance  - 3 x weekly - 3 sets - 10 reps - Seated Hamstring Stretch  - 1 x daily - 3 reps - 60 sec  hold ASSESSMENT:   CLINICAL IMPRESSION:  Pt continues knee extensor lag, but knee extension ROM limitations are not as pronounced as what was reported last session. This lag appears to be a combination of knee stiffness from swelling along with quad weakness having seen an increase in knee extension with PROM compared to AROM. Pt benefited from use of NMES to activate quads for increased knee extension during short arc quads. He will continue to benefit from skilled to regain right knee mobility and strength to return to walking without an assistive device and bending and stooping to complete job related tasks.    OBJECTIVE IMPAIRMENTS: Abnormal gait, decreased balance, decreased endurance, decreased mobility, difficulty walking, decreased ROM, decreased strength, increased  edema, and pain.    ACTIVITY LIMITATIONS: bending, standing, squatting, stairs, bed mobility, dressing, and locomotion level   PARTICIPATION LIMITATIONS: laundry, shopping, community activity, occupation, and yard work   PERSONAL FACTORS: Age, Profession, and 1 comorbidity: HTN   are also affecting patient's functional outcome.  REHAB POTENTIAL: Good   CLINICAL DECISION MAKING: Stable/uncomplicated   EVALUATION COMPLEXITY: Low     GOALS: Goals reviewed with patient? No   SHORT TERM GOALS: Target date: 12/09/2022   Pt will be independent with HEP in order to improve strength and balance in order to decrease fall risk and improve function at home and work. Baseline: Given initial HEP  Goal status: ONGOING    2.  Patient will demonstrate understanding of scar tissue massage technique and perform independently.  Baseline: Awaiting scaps to heal  Goal status: ONGOING     LONG TERM GOALS: Target date: 02/03/2023   Patient will have improved function and activity level as evidenced by an increase in FOTO score by 10 points or more.  Baseline: 31  Goal status: ONGOING    2.  Patient will improve right knee ROM flexion and extension to be nearly symmetrical to left knee AROM.   Baseline: Knee Flex R/L 60*/130, Knee Ext R/L 0*/15  Goal status: ONGOING    3.  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  Baseline: Right knee extension unable to test, Left knee extension 5/5 Goal status: ONGOING    4.  Patient will return to walking without an assistive device as a sign of right knee healing.  Baseline: Ambulating with RW   Goal status: ONGOING        PLAN:   PT FREQUENCY: 1-2x/week   PT DURATION: 10 weeks   PLANNED INTERVENTIONS: Therapeutic exercises, Neuromuscular re-education, Balance training, Gait training, Patient/Family education, Self Care, Joint mobilization, Joint  manipulation, DME instructions, Electrical stimulation, Manual therapy, and Re-evaluation, Dry needling, Cryotherapy, Aquatic therapy    PLAN FOR NEXT SESSION: Measure knee flexion and perform knee flex AROM on step. Continue with NMES and reassess HEP.    Bradly Chris PT, DPT

## 2022-12-28 ENCOUNTER — Encounter: Payer: Self-pay | Admitting: Physical Therapy

## 2022-12-28 ENCOUNTER — Ambulatory Visit: Payer: Medicare HMO | Admitting: Physical Therapy

## 2022-12-28 DIAGNOSIS — M25561 Pain in right knee: Secondary | ICD-10-CM

## 2022-12-28 DIAGNOSIS — Z96651 Presence of right artificial knee joint: Secondary | ICD-10-CM

## 2022-12-28 NOTE — Therapy (Signed)
OUTPATIENT PHYSICAL THERAPY TREATMENT NOTE/ Re-certification   Dates of Reporting: 11/25/22- 12/30/22  Patient Name: Sergio [name] MRN: [id number] DOB:10/13/1951, 72 y.o., male Today's Date: 12/28/2022  PCP: Dr. Richarda Blade  REFERRING PROVIDER: Dr. Weber Cooks   END OF SESSION:   PT End of Session - 12/28/22 1326     Visit Number 8    Number of Visits 20    Date for PT Re-Evaluation 02/03/23    Authorization Type [employer]    Authorization Time Period 11/25/22-02/03/23    Authorization - Visit Number 8    Authorization - Number of Visits 20    Progress Note Due on Visit 10    PT Start Time 1330    PT Stop Time 1415    PT Time Calculation (min) 45 min    Activity Tolerance Patient tolerated treatment well;Patient limited by pain    Behavior During Therapy [facility] for tasks assessed/performed               Past Medical History:  Diagnosis Date   Arthritis    Enlarged prostate    Erectile dysfunction    History of colonoscopy    History of COVID-19    Hypertension    Low testosterone    Low testosterone    Pneumonia    2022   Past Surgical History:  Procedure Laterality Date   COLONOSCOPY     RETINAL DETACHMENT SURGERY Right    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   Procedure: TOTAL KNEE ARTHROPLASTY;  Surgeon: Joen Laura, MD;  Location: [facility];  Service: Orthopedics;  Laterality: Right;   WISDOM TOOTH EXTRACTION     Patient Active Problem List   Diagnosis Date Noted   Localized osteoarthritis of right knee 11/23/2022   Nonspecific abnormal electrocardiogram (ECG) (EKG) 10/06/2022   Pre-op evaluation 10/06/2022   Essential hypertension 07/09/2021   Erectile dysfunction 07/09/2021   Benign prostatic hyperplasia without lower urinary tract symptoms 07/09/2021   Secondary erythrocytosis 10/25/2018    REFERRING DIAG: S/p right total knee   THERAPY DIAG:  Acute pain of right knee  Hx of total knee arthroplasty,  right  Rationale for Evaluation and Treatment Rehabilitation  PERTINENT HISTORY: Pt started experiencing right knee pain in February at which point he went to [place] and received an injection. He continued to experience pain even after the injection. Pt then went for TKA.    PRECAUTIONS: None   SUBJECTIVE:  SUBJECTIVE STATEMENT:  Pt reports that he feels like his right knee is feeling better. He was even able to walk a longer distance while at church. He is now using SPC to walk around instead of RW.  PAIN:  Are you having pain? Yes: NPRS scale: 1/10 Pain location: Right knee joint line  Pain description: Aching  Aggravating factors: Straightening knee  Relieving factors: Keep still and bending it a little bit    OBJECTIVE: (objective measures completed at initial evaluation unless otherwise dated)      TODAY'S TREATMENT:                                                                                                                              DATE:   12/28/22:  Nu-Step level 9 for arms and feet with level 3 resistance for 5 min  Knee Flex R/L 105/135, Knee Ext R/L 0/4  Knee Ext R/L 4+/5 Sit to Stand without BUE support 3 x 10  -NPS 2/10 in right knee joint  FOTO: 52/58    12/21/22  Nu-Step level 9 for arms and feet with level 1 resistance for 5 min  Knee Ext R/L 15 deg/ 0 deg   Left Short Arc Quad PROM>AROM   NMES with performance of SAQ x 20 Wavefore: [place] Channel Mode: Single   Intensity: 85 mA CC   Duration: 10 min  Cycle Time: 10/50   Ramp: 2 sec      12/17/22  -AA/ROM: 3 minutes seat at 10, 1 minute seat 9, 1 minute 8 on Nustep -RLE 2nd step lunge stretch 4x30sec  -seated Hamstring -seated RLE heel slide A/ROM knee flexion/extension  -STS from c chair + airex 3x5, hands out  forward stretched  -RLE SAQ over burgundy 4x10 (min-modA provided due to significant limitation remaining)   ROM this date: 22-86 degrees Rt knee flexion *discussion about better polar care temperature regulation to allow for wearing longer than 30 minutes. TKE stretch at home is uncomfortable and limiting.    12/14/22  THEREX Nu-Step seat and arms level 8 with no resistance for 5 min  Right Knee Flexion AAROM with heel slider under foot 3 x 10    Gait Training   Walking Exercises: [email or web address]  Heel contact on RLE with 1 UE support 2 x 10  -mod VC for step length and to maintain upright posture   Swing phase on RLE with 1 UE support 1 x 10   10 m x 10 with SPC  -mod VC for sequencing of step to gait pattern   Ambulating outside over mulch outside with SPC 50 ft x 2  Down 1 ft curb and up 1 ft curb  -min VC for foot placement, down with bad and up with good     PATIENT EDUCATION:  Education details: form and technique for appropriate exercise and explanation about modalities for decreased swelling and pain relief  Person educated: Patient and wife  Education method: Explanation, Demonstration, Verbal cues, and Handouts Education comprehension: verbalized understanding, returned demonstration, and verbal cues required   HOME EXERCISE PROGRAM: Access Code: [email or web address] URL: [email or web address] Date: 12/28/2022 Prepared by: Bradly Chris  Exercises - Seated Knee Flexion AAROM  - 1 x daily - 3 sets - 10 reps - 1-2 sec  hold - Supine Ankle Pumps  - 1 x daily - 3 sets - 10 reps - Sidelying Hip Abduction  - 3 x weekly - 3 sets - 10 reps - Supine Heel Slide with Strap  - 1 x daily - 2 sets - 20 reps - Supine Active Straight Leg Raise  - 3 x weekly - 3 sets - 10 reps - Supine Short Arc Quad  - 3 x weekly - 3 sets - 10 reps - Sidelying Hip Adduction  - 3 x weekly - 3 sets - 10 reps - Seated Hamstring Stretch  - 1 x daily - 3 reps - 60  sec  hold - Sit to Stand  - 3 x weekly - 3 sets - 10 reps  Patient Education - Scar Massage ASSESSMENT:   CLINICAL IMPRESSION:  Pt shows improved right knee ROM and strength and increased functional perception since his initial evaluation. PT progressed LE strengthening to sit to stand with pt able to perform without use of UE support. He still does show decreased right knee extension likely due to ongoing post surgical swelling. He will continue to benefit from skilled PT to regain right knee mobility and strength to return to walking without an assistive device and bending and stooping to complete job related tasks.    OBJECTIVE IMPAIRMENTS: Abnormal gait, decreased balance, decreased endurance, decreased mobility, difficulty walking, decreased ROM, decreased strength, increased edema, and pain.    ACTIVITY LIMITATIONS: bending, standing, squatting, stairs, bed mobility, dressing, and locomotion level   PARTICIPATION LIMITATIONS: laundry, shopping, community activity, occupation, and yard work   PERSONAL FACTORS: Age, Profession, and 1 comorbidity: HTN   are also affecting patient's functional outcome.    REHAB POTENTIAL: Good   CLINICAL DECISION MAKING: Stable/uncomplicated   EVALUATION COMPLEXITY: Low     GOALS: Goals reviewed with patient? No   SHORT TERM GOALS: Target date: 12/09/2022   Pt will be independent with HEP in order to improve strength and balance in order to decrease fall risk and improve function at home and work. Baseline: Given initial HEP  Goal status: ONGOING    2.  Patient will demonstrate understanding of scar tissue massage technique and perform independently.  Baseline: Awaiting scaps to heal  12/28/22: Demonstrated ability to perform scar massage independently  Goal status: Achieved      LONG TERM GOALS: Target date: 02/03/2023   Patient will have improved function and activity level as evidenced by an increase in FOTO score by 10 points or more.   Baseline: 31   12/28/22: 52  Goal status: Achieved     2.  Patient will improve right knee ROM flexion and extension to be nearly symmetrical to left knee AROM.   Baseline: Knee Flex R/L 60*/130, Knee Ext R/L 0*/15   12/28/22: Knee Flex R/L 105/135, Knee Ext R/L 0/4  Goal status: Progressing    3.  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  Baseline: Right knee extension unable to test, Left knee extension 5/5 12/28/22: Knee Ext R/L 4+/5  Goal status: ONGOING  4.  Patient will return to walking without an assistive device as a sign of right knee healing.  Baseline: Ambulating with RW  12/28/22: Currently using SPC  Goal status: ONGOING        PLAN:   PT FREQUENCY: 1-2x/week   PT DURATION: 10 weeks   PLANNED INTERVENTIONS: Therapeutic exercises, Neuromuscular re-education, Balance training, Gait training, Patient/Family education, Self Care, Joint mobilization, Joint manipulation, DME instructions, Electrical stimulation, Manual therapy, and Re-evaluation, Dry needling, Cryotherapy, Aquatic therapy    PLAN FOR NEXT SESSION: Continue with NMES and reassess HEP. Progress knee strength and mobility. 1/4 front lunge, start stationary bike, front and lateral step up and step downs    Bradly Chris PT, DPT

## 2022-12-31 ENCOUNTER — Ambulatory Visit: Payer: Medicare HMO | Admitting: Physical Therapy

## 2022-12-31 ENCOUNTER — Encounter: Payer: Self-pay | Admitting: Physical Therapy

## 2022-12-31 DIAGNOSIS — M25561 Pain in right knee: Secondary | ICD-10-CM | POA: Diagnosis not present

## 2022-12-31 DIAGNOSIS — Z96651 Presence of right artificial knee joint: Secondary | ICD-10-CM

## 2022-12-31 NOTE — Therapy (Addendum)
OUTPATIENT PHYSICAL THERAPY TREATMENT NOTE/ Re-certification   Dates of Reporting: 11/25/22- 12/30/22  Patient Name: Sergio [name] MRN: [id number] DOB:November 27, 1951, 72 y.o., male Today's Date: 12/31/2022  PCP: Dr. Richarda Blade  REFERRING PROVIDER: Dr. Weber Cooks   END OF SESSION:   PT End of Session - 12/31/22 1333     Visit Number 9    Number of Visits 20    Date for PT Re-Evaluation 02/03/23    Authorization Type [employer]    Authorization Time Period 11/25/22-02/03/23    Authorization - Visit Number 9    Authorization - Number of Visits 20    Progress Note Due on Visit 10    PT Start Time 1330    PT Stop Time 1415    PT Time Calculation (min) 45 min    Activity Tolerance Patient tolerated treatment well;Patient limited by pain    Behavior During Therapy [facility] for tasks assessed/performed               Past Medical History:  Diagnosis Date   Arthritis    Enlarged prostate    Erectile dysfunction    History of colonoscopy    History of COVID-19    Hypertension    Low testosterone    Low testosterone    Pneumonia    2022   Past Surgical History:  Procedure Laterality Date   COLONOSCOPY     RETINAL DETACHMENT SURGERY Right    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   Procedure: TOTAL KNEE ARTHROPLASTY;  Surgeon: Joen Laura, MD;  Location: [facility];  Service: Orthopedics;  Laterality: Right;   WISDOM TOOTH EXTRACTION     Patient Active Problem List   Diagnosis Date Noted   Localized osteoarthritis of right knee 11/23/2022   Nonspecific abnormal electrocardiogram (ECG) (EKG) 10/06/2022   Pre-op evaluation 10/06/2022   Essential hypertension 07/09/2021   Erectile dysfunction 07/09/2021   Benign prostatic hyperplasia without lower urinary tract symptoms 07/09/2021   Secondary erythrocytosis 10/25/2018    REFERRING DIAG: S/p right total knee   THERAPY DIAG:  Acute pain of right knee  Hx of total knee arthroplasty,  right  Rationale for Evaluation and Treatment Rehabilitation  PERTINENT HISTORY: Pt started experiencing right knee pain in February at which point he went to [place] and received an injection. He continued to experience pain even after the injection. Pt then went for TKA.    PRECAUTIONS: None   SUBJECTIVE:  SUBJECTIVE STATEMENT:  Pt states that he has been feeling increased right knee pain the past couple of days especially when he performs sit to stand.   PAIN:  Are you having pain? Yes: NPRS scale: 4/10 Pain location: Right knee joint line  Pain description: Aching  Aggravating factors: Straightening knee  Relieving factors: Keep still and bending it a little bit    OBJECTIVE: (objective measures completed at initial evaluation unless otherwise dated)      TODAY'S TREATMENT:                                                                                                                              DATE:   12/31/22: Matrix recumbent bicycle set to 17 with no resistance for 5 min  -Pt displays significant pain  Prone Knee Hang on RLE with #2 AW 5 x 30 sec  Seated Knee Hang on RLE  with 10 lb in backpack for 5 min  Partial Lunges with 1 UE support 3 x 10  -mod VC and TC to maintain posterior weight shift by keeping knees behind toes  Mini-Squat   12/28/22:  Nu-Step level 9 for arms and feet with level 3 resistance for 5 min  Knee Flex R/L 105/135, Knee Ext R/L 0/4  Knee Ext R/L 4+/5 Sit to Stand without BUE support 3 x 10  -NPS 2/10 in right knee joint  FOTO: 52/58    12/21/22  Nu-Step level 9 for arms and feet with level 1 resistance for 5 min  Knee Ext R/L 15 deg/ 0 deg   Left Short Arc Quad PROM>AROM   NMES with performance of SAQ x 20 Wavefore: [place] Channel Mode: Single   Intensity: 85  mA CC   Duration: 10 min  Cycle Time: 10/50   Ramp: 2 sec      12/17/22  -AA/ROM: 3 minutes seat at 10, 1 minute seat 9, 1 minute 8 on Nustep -RLE 2nd step lunge stretch 4x30sec  -seated Hamstring -seated RLE heel slide A/ROM knee flexion/extension  -STS from c chair + airex 3x5, hands out forward stretched  -RLE SAQ over burgundy 4x10 (min-modA provided due to significant limitation remaining)   ROM this date: 22-86 degrees Rt knee flexion *discussion about better polar care temperature regulation to allow for wearing longer than 30 minutes. TKE stretch at home is uncomfortable and limiting.     PATIENT EDUCATION:  Education details: form and technique for appropriate exercise and explanation about modalities for decreased swelling and pain relief  Person educated: Patient and wife  Education method: Explanation, Demonstration, Verbal cues, and Handouts Education comprehension: verbalized understanding, returned demonstration, and verbal cues required   HOME EXERCISE PROGRAM: Access Code: [email or web address] URL: [email or web address] Date: 12/31/2022 Prepared by: Ellin Goodie  Exercises - Seated Knee Flexion AAROM  - 1 x daily - 3 sets - 10 reps - 1-2 sec  hold - Supine Ankle Pumps  - 1 x daily -  3 sets - 10 reps - Sidelying Hip Abduction  - 3 x weekly - 3 sets - 10 reps - Supine Heel Slide with Strap  - 1 x daily - 2 sets - 20 reps - Supine Short Arc Quad  - 7 x weekly - 3 sets - 10 reps - Sidelying Hip Adduction  - 3 x weekly - 3 sets - 10 reps - Seated Hamstring Stretch  - 1 x daily - 3 reps - 60 sec  hold - Prone Knee Extension with Ankle Weight  - 1 x daily - 1 reps - 5-10 min hold - Partial Lunge with Chair  - 3 x weekly - 3 sets - 10 reps - Seated Knee Extension Stretch with Chair  - 1 x daily - 1 reps - 5-10 min hold  Patient Education - Scar Massage ASSESSMENT:   CLINICAL IMPRESSION:  Pt continues to show decreased right quad strength with inability to  perform short arc quad to full ROM. This appears to be more of a strength issue as PROM into knee extension results in full ROM. Continue to focus session on knee extension strengthening with partial lunges and knee extension mobility with seated knee extension with weight for added overpressure and prone knee hang. He will continue to benefit from skilled PT to regain right knee mobility and strength to return to walking without an assistive device and bending and stooping to complete job related tasks.   OBJECTIVE IMPAIRMENTS: Abnormal gait, decreased balance, decreased endurance, decreased mobility, difficulty walking, decreased ROM, decreased strength, increased edema, and pain.    ACTIVITY LIMITATIONS: bending, standing, squatting, stairs, bed mobility, dressing, and locomotion level   PARTICIPATION LIMITATIONS: laundry, shopping, community activity, occupation, and yard work   PERSONAL FACTORS: Age, Profession, and 1 comorbidity: HTN   are also affecting patient's functional outcome.    REHAB POTENTIAL: Good   CLINICAL DECISION MAKING: Stable/uncomplicated   EVALUATION COMPLEXITY: Low     GOALS: Goals reviewed with patient? No   SHORT TERM GOALS: Target date: 12/09/2022   Pt will be independent with HEP in order to improve strength and balance in order to decrease fall risk and improve function at home and work. Baseline: Given initial HEP  Goal status: ONGOING    2.  Patient will demonstrate understanding of scar tissue massage technique and perform independently.  Baseline: Awaiting scaps to heal  12/28/22: Demonstrated ability to perform scar massage independently  Goal status: Achieved      LONG TERM GOALS: Target date: 02/03/2023   Patient will have improved function and activity level as evidenced by an increase in FOTO score by 10 points or more.  Baseline: 31   12/28/22: 52  Goal status: Achieved     2.  Patient will improve right knee ROM flexion and extension to be  nearly symmetrical to left knee AROM.   Baseline: Knee Flex R/L 60*/130, Knee Ext R/L 0*/15   12/28/22: Knee Flex R/L 105/135, Knee Ext R/L 0/4  Goal status: Progressing    3.  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  Baseline: Right knee extension unable to test, Left knee extension 5/5 12/28/22: Knee Ext R/L 4+/5  Goal status: ONGOING    4.  Patient will return to walking without an assistive device as a sign of right knee healing.  Baseline: Ambulating with RW  12/28/22: Currently using SPC  Goal status: ONGOING        PLAN:   PT FREQUENCY: 1-2x/week   PT DURATION: 10 weeks   PLANNED INTERVENTIONS: Therapeutic exercises, Neuromuscular re-education, Balance training, Gait training, Patient/Family education, Self Care, Joint mobilization, Joint manipulation, DME instructions, Electrical stimulation, Manual therapy, and Re-evaluation, Dry needling, Cryotherapy, Aquatic therapy    PLAN FOR NEXT SESSION: Reassess goals. Focus on quad strengthening: TKE with black rogue band, NMES,1/4 front lunge, start stationary bike, front and lateral step up and step downs    Bradly Chris PT, DPT

## 2023-01-04 ENCOUNTER — Ambulatory Visit: Payer: Medicare HMO | Admitting: Physical Therapy

## 2023-01-06 ENCOUNTER — Ambulatory Visit: Payer: Medicare HMO | Admitting: Internal Medicine

## 2023-01-06 ENCOUNTER — Encounter: Payer: Self-pay | Admitting: Internal Medicine

## 2023-01-06 VITALS — BP 128/83 | HR 83 | Ht 68.0 in | Wt 177.6 lb

## 2023-01-06 DIAGNOSIS — I10 Essential (primary) hypertension: Secondary | ICD-10-CM

## 2023-01-06 DIAGNOSIS — Z01818 Encounter for other preprocedural examination: Secondary | ICD-10-CM

## 2023-01-06 NOTE — Progress Notes (Signed)
Primary Physician/Referring:  Sandrea Hughs, NP  Patient ID: Sergio [name], male    DOB: [birth year], 72 y.o.   MRN: [id number]  Chief Complaint  Patient presents with  . Abnormal ECG  . Follow-up  . Results   HPI:    Sergio [name]  is a 72 y.o. male with hypertension who is here to establish care with cardiology.  Patient is looking to have a knee replacement before the end of the year and his surgeon is requesting clearance from hematology, internal medicine, and cardiology.  Patient does not smoke or drink alcohol.  He has never had to see a cardiologist in the past.  He denies cardiac history in himself.  Patient does not have any chest pain, shortness of breath, palpitations, diaphoresis, syncope, edema, orthopnea, claudication.  Since he has not had a stress test or echo we will obtain the studies prior to clearing the patient for knee surgery.  Patient is agreeable to this.  He will follow-up in a few months after his surgery to check in on his blood pressure. This patient has been doing well.  Past Medical History:  Diagnosis Date  . Arthritis   . Enlarged prostate   . Erectile dysfunction   . History of colonoscopy   . History of COVID-19   . Hypertension   . Low testosterone   . Low testosterone   . Pneumonia    2022   Past Surgical History:  Procedure Laterality Date  . COLONOSCOPY    . RETINAL DETACHMENT SURGERY Right   . 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   Procedure: TOTAL KNEE ARTHROPLASTY;  Surgeon: Willaim Sheng, MD;  Location: [facility];  Service: Orthopedics;  Laterality: Right;  . WISDOM TOOTH EXTRACTION     Family History  Problem Relation Age of Onset  . Heart failure Mother   . Heart failure Father   . Arthritis Sister   . Arthritis Sister   . Arthritis Son     Social History   Tobacco Use  . Smoking status: Never  . Smokeless tobacco: Never  Substance Use Topics  . Alcohol use: Never   Marital Status: Married  ROS   Review of Systems  Cardiovascular:  Positive for irregular heartbeat.   Objective  Blood pressure 128/83, pulse 83, height 5\' 8"  (1.727 m), weight 177 lb 9.6 oz (80.6 kg), SpO2 98 %. Body mass index is 27 kg/m.     01/06/2023    8:12 AM 11/24/2022    1:36 PM 11/24/2022    9:40 AM  Vitals with BMI  Height 5\' 8"     Weight 177 lbs 10 oz    BMI 57.32    Systolic 202 542 706  Diastolic 83 86 80  Pulse 83 87 103     Physical Exam Vitals reviewed.  HENT:     Head: Normocephalic and atraumatic.  Cardiovascular:     Rate and Rhythm: Normal rate and regular rhythm.     Pulses: Normal pulses.     Heart sounds: Normal heart sounds. No murmur heard. Pulmonary:     Effort: Pulmonary effort is normal.     Breath sounds: Normal breath sounds.  Abdominal:     General: Bowel sounds are normal.  Musculoskeletal:     Right lower leg: No edema.     Left lower leg: No edema.  Skin:    General: Skin is warm and dry.  Neurological:     Mental Status: He is  alert.    Medications and allergies  No Known Allergies   Medication list after today's encounter   Current Outpatient Medications:  .  ascorbic acid (VITAMIN C) 500 MG tablet, Take 500 mg by mouth daily., Disp: , Rfl:  .  aspirin EC 81 MG tablet, Take 81 mg by mouth daily., Disp: , Rfl:  .  hydrochlorothiazide (HYDRODIURIL) 25 MG tablet, TAKE 1 TABLET (25 MG TOTAL) BY MOUTH DAILY., Disp: 90 tablet, Rfl: 1 .  lisinopril (ZESTRIL) 20 MG tablet, Take 1 tablet (20 mg total) by mouth daily., Disp: 90 tablet, Rfl: 1 .  Multiple Vitamin (MULTIVITAMIN) tablet, Take 1 tablet by mouth daily., Disp: , Rfl:  .  sildenafil (REVATIO) 20 MG tablet, Take 1 tablet (20 mg total) by mouth as needed., Disp: 10 tablet, Rfl: 0 .  silodosin (RAPAFLO) 8 MG CAPS capsule, Take 1 capsule (8 mg total) by mouth daily with breakfast., Disp: 30 capsule, Rfl:  .  Testosterone Cypionate 100 MG/ML SOLN, 10% GEL. 4 clicks daily. (Patient taking differently: Apply 4  Pump topically daily. 10% GEL. 4 clicks daily.), Disp: 100 mL, Rfl: 3  Laboratory examination:   Lab Results  Component Value Date   NA 132 (L) 11/24/2022   K 3.8 11/24/2022   CO2 23 11/24/2022   GLUCOSE 162 (H) 11/24/2022   BUN 15 11/24/2022   CREATININE 1.02 11/24/2022   CALCIUM 8.7 (L) 11/24/2022   EGFR 68 07/31/2022   GFRNONAA >60 11/24/2022       Latest Ref Rng & Units 11/24/2022    4:10 AM 11/17/2022   10:00 AM 07/31/2022    9:17 AM  CMP  Glucose 70 - 99 mg/dL 162  92  96   BUN 8 - 23 mg/dL 15  12  10    Creatinine 0.61 - 1.24 mg/dL 1.02  0.83  1.16   Sodium 135 - 145 mmol/L 132  131  134   Potassium 3.5 - 5.1 mmol/L 3.8  3.8  4.2   Chloride 98 - 111 mmol/L 102  99  100   CO2 22 - 32 mmol/L 23  24  24    Calcium 8.9 - 10.3 mg/dL 8.7  9.3  9.1   Total Protein 6.1 - 8.1 g/dL   6.7   Total Bilirubin 0.2 - 1.2 mg/dL   1.3   AST 10 - 35 U/L   23   ALT 9 - 46 U/L   24       Latest Ref Rng & Units 11/24/2022    4:10 AM 11/02/2022    8:19 AM 07/31/2022    9:17 AM  CBC  WBC 4.0 - 10.5 K/uL 10.2  5.6  4.7   Hemoglobin 13.0 - 17.0 g/dL 13.1  15.3  15.2   Hematocrit 39.0 - 52.0 % 42.0  48.7  50.1   Platelets 150 - 400 K/uL 198  218  188     Lipid Panel Recent Labs    01/23/22 0913 07/31/22 0917  CHOL 161 152  TRIG 175* 91  LDLCALC 92 91  HDL 42 42  CHOLHDL 3.8 3.6     HEMOGLOBIN A1C No results found for: "HGBA1C", "MPG" TSH Recent Labs    01/23/22 0913 07/31/22 0917  TSH 1.77 1.41     External labs:     Radiology:    Cardiac Studies:   No results found for this or any previous visit from the past 1095 days.     No results found for  this or any previous visit from the past 1095 days.     EKG:   10/06/2022 Sinus Rhythm, iRBBB, Left atrial enlargement. Nonspecific T-abnormality  Assessment   No diagnosis found.    No orders of the defined types were placed in this encounter.   No orders of the defined types were placed in this  encounter.   There are no discontinued medications.   Recommendations:   Sergio [name] is a 72 y.o.  male with hypertension   Nonspecific abnormal electrocardiogram (ECG) (EKG) Echocardiogram ordered   Essential hypertension Continue current cardiac medications. Encourage low-sodium diet, less than 2000 mg daily. Follow-up in 3 months or sooner if needed.   Pre-op evaluation Stress test ordered Will clear pt for knee surgery once at least stress test results are back     Floydene Flock, DO, [facility]  01/06/2023, 8:44 AM Office: [phone] Pager: [phone]

## 2023-01-07 ENCOUNTER — Encounter: Payer: Self-pay | Admitting: Physical Therapy

## 2023-01-07 ENCOUNTER — Ambulatory Visit: Payer: Medicare HMO | Attending: Orthopedic Surgery | Admitting: Physical Therapy

## 2023-01-07 DIAGNOSIS — M25561 Pain in right knee: Secondary | ICD-10-CM | POA: Insufficient documentation

## 2023-01-07 DIAGNOSIS — Z96651 Presence of right artificial knee joint: Secondary | ICD-10-CM | POA: Diagnosis present

## 2023-01-07 NOTE — Therapy (Addendum)
OUTPATIENT PHYSICAL THERAPY PROGRESS NOTE  Patient Name: Sergio [name] MRN: [id number] DOB:01-05-1951, 72 y.o., male Today's Date: 01/07/2023  PCP: Dr. Marlowe Sax  REFERRING PROVIDER: Dr. Charlies Constable   END OF SESSION:   PT End of Session - 01/07/23 1327     Visit Number 10    Number of Visits 20    Date for PT Re-Evaluation 02/03/23    Authorization Type [employer]    Authorization Time Period 11/25/22-02/03/23    Authorization - Visit Number 10    Authorization - Number of Visits 20    Progress Note Due on Visit 10    PT Start Time 1330    PT Stop Time 4166    PT Time Calculation (min) 45 min    Activity Tolerance Patient tolerated treatment well;Patient limited by pain    Behavior During Therapy [facility] for tasks assessed/performed               Past Medical History:  Diagnosis Date   Arthritis    Enlarged prostate    Erectile dysfunction    History of colonoscopy    History of COVID-19    Hypertension    Low testosterone    Low testosterone    Pneumonia    2022   Past Surgical History:  Procedure Laterality Date   COLONOSCOPY     RETINAL DETACHMENT SURGERY Right    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   Procedure: TOTAL KNEE ARTHROPLASTY;  Surgeon: Willaim Sheng, MD;  Location: [facility];  Service: Orthopedics;  Laterality: Right;   WISDOM TOOTH EXTRACTION     Patient Active Problem List   Diagnosis Date Noted   Localized osteoarthritis of right knee 11/23/2022   Nonspecific abnormal electrocardiogram (ECG) (EKG) 10/06/2022   Pre-op evaluation 10/06/2022   Essential hypertension 07/09/2021   Erectile dysfunction 07/09/2021   Benign prostatic hyperplasia without lower urinary tract symptoms 07/09/2021   Secondary erythrocytosis 10/25/2018    REFERRING DIAG: S/p right total knee   THERAPY DIAG:  Acute pain of right knee  Hx of total knee arthroplasty, right  Rationale for Evaluation and Treatment Rehabilitation  PERTINENT  HISTORY: Pt started experiencing right knee pain in February at which point he went to [place] and received an injection. He continued to experience pain even after the injection. Pt then went for TKA.    PRECAUTIONS: None   SUBJECTIVE:                                                                                                                                                                                      SUBJECTIVE STATEMENT:  Pt reports that he  had a recent apt with his orthopedist, who said his ROM and strength are good. He recently walked on gravel without can and felt somewhat steady. He occasionally uses SPC but tries not to use it when he does not need to.    PAIN:  Are you having pain? Yes: NPRS scale: 2/10 Pain location: Right knee joint line  Pain description: Aching  Aggravating factors: Straightening knee  Relieving factors: Keep still and bending it a little bit    OBJECTIVE: (objective measures completed at initial evaluation unless otherwise dated)      TODAY'S TREATMENT:                                                                                                                              DATE:   01/07/23:  Matrix recumbent bicycle set to 13 with no resistance for 5 min  -Pt displays significant pain  Knee Ext RLE MMT 5/5  Knee Ext AROM R/L 4/0 Knee Ext PROM R/L 3/-1 Knee Flex AROM R/L 105/140 Knee Flex PROM R/L 107/140  Prone Quad Stretch 3 x 60 sec  Short Arc Quad using [employer]- started 60 degrees flexion -AROM to 30  -PROM to 35  -lacking 5 degrees extension  Step up on 4 inch step on RLE 1 x 10  Step up on 8 inch step on RLE with 1 UE support 1 x 10  Step up on 6 inch step on RLE with 1 UE support  3 x 10   Gave pt TKA rehab protocol and explained that we are in 7-12 week range with goals and exercises.     12/31/22: Matrix recumbent bicycle set to 17 with no resistance for 5 min  -Pt displays significant pain  Prone Knee Hang on RLE with  #2 AW 5 x 30 sec  Seated Knee Hang on RLE  with 10 lb in backpack for 5 min  Partial Lunges with 1 UE support 3 x 10  -mod VC and TC to maintain posterior weight shift by keeping knees behind toes  Mini-Squat   12/28/22:  Nu-Step level 9 for arms and feet with level 3 resistance for 5 min  Knee Flex R/L 105/135, Knee Ext R/L 0/4  Knee Ext R/L 4+/5 Sit to Stand without BUE support 3 x 10  -NPS 2/10 in right knee joint  FOTO: 52/58    12/21/22  Nu-Step level 9 for arms and feet with level 1 resistance for 5 min  Knee Ext R/L 15 deg/ 0 deg   Left Short Arc Quad PROM>AROM   NMES with performance of SAQ x 20 Wavefore: [place] Mode: Single   Intensity: 85 mA CC   Duration: 10 min  Cycle Time: 10/50   Ramp: 2 sec     PATIENT EDUCATION:  Education details: form and technique for appropriate exercise and explanation about modalities for decreased swelling and pain relief  Person educated: Patient and wife  Education method: Explanation, Demonstration, Verbal cues, and Handouts Education comprehension: verbalized understanding, returned demonstration, and verbal cues required   HOME EXERCISE PROGRAM: Access Code: [email or web address] URL: [email or web address] Date: 01/07/2023 Prepared by: Ellin Goodie  Exercises - Seated Knee Extension Stretch with Chair  - 1 x daily - 1 reps - 5-10 min hold - Prone Knee Extension with Ankle Weight  - 1 x daily - 1 reps - 5-10 min hold - Seated Hamstring Stretch  - 1 x daily - 3 reps - 60 sec  hold - Prone Quadriceps Stretch with Strap  - 1 x daily - 3 reps - 60 sec hold - Sidelying Hip Abduction  - 3 x weekly - 3 sets - 10 reps - Sidelying Hip Adduction  - 3 x weekly - 3 sets - 10 reps - Forward Step Up with Counter Support  - 3 x weekly - 3 sets - 10 reps  Patient Education - Scar Massage  ASSESSMENT:   CLINICAL IMPRESSION:  Pt shows an improvement in right knee strength and ROM. He continues lack right knee extension and  flexion and he still uses SPC for ambulation. 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    OBJECTIVE IMPAIRMENTS: Abnormal gait, decreased balance, decreased endurance, decreased mobility, difficulty walking, decreased ROM, decreased strength, increased edema, and pain.    ACTIVITY LIMITATIONS: bending, standing, squatting, stairs, bed mobility, dressing, and locomotion level   PARTICIPATION LIMITATIONS: laundry, shopping, community activity, occupation, and yard work   PERSONAL FACTORS: Age, Profession, and 1 comorbidity: HTN   are also affecting patient's functional outcome.    REHAB POTENTIAL: Good   CLINICAL DECISION MAKING: Stable/uncomplicated   EVALUATION COMPLEXITY: Low     GOALS: Goals reviewed with patient? No   SHORT TERM GOALS: Target date: 12/09/2022   Pt will be independent with HEP in order to improve strength and balance in order to decrease fall risk and improve function at home and work. Baseline: Given initial HEP  Goal status: ONGOING    2.  Patient will demonstrate understanding of scar tissue massage technique and perform independently.  Baseline: Awaiting scaps to heal  12/28/22: Demonstrated ability to perform scar massage independently  Goal status: Achieved      LONG TERM GOALS: Target date: 02/03/2023   Patient will have improved function and activity level as evidenced by an increase in FOTO score by 10 points or more.  Baseline: 31   12/28/22: 52  Goal status: Achieved     2.  Patient will improve right knee ROM flexion and extension to be nearly symmetrical to left knee AROM.   Baseline: Knee Flex R/L 60*/130, Knee Ext R/L 0*/15   12/28/22: Knee Flex R/L 105/135, Knee Ext R/L 0/4 01/07/23: Knee Ext R/L 4/0, Knee Flex R/L 104/140  Goal status: Progressing    3.  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  Baseline: Right knee extension unable to test, Left knee extension 5/5 12/28/22: Knee Ext R/L 4+/5  Goal status: Achieved    4.  Patient will return to walking without an assistive device as a sign of right knee healing.  Baseline: Ambulating with RW  12/28/22: Currently using SPC  Goal status: ONGOING        PLAN:   PT  FREQUENCY: 1-2x/week   PT DURATION: 10 weeks   PLANNED INTERVENTIONS: Therapeutic exercises, Neuromuscular re-education, Balance training, Gait training, Patient/Family education, Self Care, Joint mobilization, Joint manipulation, DME instructions, Electrical stimulation, Manual therapy, and Re-evaluation, Dry needling, Cryotherapy, Aquatic therapy    PLAN FOR NEXT SESSION: FOTO. Decrease setting for stationary bike seat and increase resistance. Gait analysis and continue with progressing quad exercises   Bradly Chris PT, DPT

## 2023-01-11 ENCOUNTER — Encounter: Payer: Self-pay | Admitting: Physical Therapy

## 2023-01-11 ENCOUNTER — Ambulatory Visit: Payer: Medicare HMO | Admitting: Physical Therapy

## 2023-01-11 DIAGNOSIS — Z96651 Presence of right artificial knee joint: Secondary | ICD-10-CM

## 2023-01-11 DIAGNOSIS — M25561 Pain in right knee: Secondary | ICD-10-CM

## 2023-01-11 NOTE — Therapy (Addendum)
OUTPATIENT PHYSICAL THERAPY TREATMENT NOTE  Patient Name: Sergio [name] MRN: [id number] DOB:Jun 12, 1951, 72 y.o., male Today's Date: 01/11/2023  PCP: Dr. Richarda Blade  REFERRING PROVIDER: Dr. Weber Cooks   END OF SESSION:   PT End of Session - 01/11/23 1100     Visit Number 11    Number of Visits 20    Date for PT Re-Evaluation 02/03/23    Authorization Type [employer]    Authorization Time Period 11/25/22-02/03/23    Authorization - Visit Number 11    Authorization - Number of Visits 20    Progress Note Due on Visit 10    PT Start Time 1015    PT Stop Time 1100    PT Time Calculation (min) 45 min    Activity Tolerance Patient tolerated treatment well    Behavior During Therapy [facility] for tasks assessed/performed                Past Medical History:  Diagnosis Date   Arthritis    Enlarged prostate    Erectile dysfunction    History of colonoscopy    History of COVID-19    Hypertension    Low testosterone    Low testosterone    Pneumonia    2022   Past Surgical History:  Procedure Laterality Date   COLONOSCOPY     RETINAL DETACHMENT SURGERY Right    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   Procedure: TOTAL KNEE ARTHROPLASTY;  Surgeon: Joen Laura, MD;  Location: [facility];  Service: Orthopedics;  Laterality: Right;   WISDOM TOOTH EXTRACTION     Patient Active Problem List   Diagnosis Date Noted   Localized osteoarthritis of right knee 11/23/2022   Nonspecific abnormal electrocardiogram (ECG) (EKG) 10/06/2022   Pre-op evaluation 10/06/2022   Essential hypertension 07/09/2021   Erectile dysfunction 07/09/2021   Benign prostatic hyperplasia without lower urinary tract symptoms 07/09/2021   Secondary erythrocytosis 10/25/2018    REFERRING DIAG: S/p right total knee   THERAPY DIAG:  Acute pain of right knee  Hx of total knee arthroplasty, right  Rationale for Evaluation and Treatment Rehabilitation  PERTINENT HISTORY: Pt started  experiencing right knee pain in February at which point he went to [place] and received an injection. He continued to experience pain even after the injection. Pt then went for TKA.    PRECAUTIONS: None   SUBJECTIVE:                                                                                                                                                                                      SUBJECTIVE STATEMENT:  Pt reports that his knee is  feeling sore this morning. He has been walking around his house without cane and he walks around outside with cane for stability.   PAIN:  Are you having pain? Yes: NPRS scale: 2/10 Pain location: Right knee joint line  Pain description: Aching  Aggravating factors: Straightening knee  Relieving factors: Keep still and bending it a little bit    OBJECTIVE: (objective measures completed at initial evaluation unless otherwise dated)      TODAY'S TREATMENT:                                                                                                                              DATE:   01/11/23: Overground ambulation with no AD around front and back parking lot loops-770 ft x 2  -min VC for heel to toe ambulation   Sit to Stand 1 x 10  Sit to Stand with #8 DB 1 x 10  Sit to Stand with #8 DB 1 x 10   01/07/23:  Matrix recumbent bicycle set to 13 with no resistance for 5 min  -Pt displays significant pain  Knee Ext RLE MMT 5/5  Knee Ext AROM R/L 4/0 Knee Ext PROM R/L 3/-1 Knee Flex AROM R/L 105/140 Knee Flex PROM R/L 107/140  Prone Quad Stretch 3 x 60 sec  Short Arc Quad using [employer]- started 60 degrees flexion -AROM to 30  -PROM to 35  -lacking 5 degrees extension  Step up on 4 inch step on RLE 1 x 10  Step up on 8 inch step on RLE with 1 UE support 1 x 10  Step up on 6 inch step on RLE with 1 UE support  3 x 10   Gave pt TKA rehab protocol and explained that we are in 7-12 week range with goals and exercises.      12/31/22: Matrix recumbent bicycle set to 17 with no resistance for 5 min  -Pt displays significant pain  Prone Knee Hang on RLE with #2 AW 5 x 30 sec  Seated Knee Hang on RLE  with 10 lb in backpack for 5 min  Partial Lunges with 1 UE support 3 x 10  -mod VC and TC to maintain posterior weight shift by keeping knees behind toes  Mini-Squat   12/28/22:  Nu-Step level 9 for arms and feet with level 3 resistance for 5 min  Knee Flex R/L 105/135, Knee Ext R/L 0/4  Knee Ext R/L 4+/5 Sit to Stand without BUE support 3 x 10  -NPS 2/10 in right knee joint  FOTO: 52/58    12/21/22  Nu-Step level 9 for arms and feet with level 1 resistance for 5 min  Knee Ext R/L 15 deg/ 0 deg   Left Short Arc Quad PROM>AROM   NMES with performance of SAQ x 20 Wave[employer]: [employer] Mode: Single   Intensity: 85 mA CC   Duration: 10 min  Cycle Time: 10/50   Ramp: 2  sec     PATIENT EDUCATION:  Education details: form and technique for appropriate exercise and explanation about modalities for decreased swelling and pain relief  Person educated: Patient and wife  Education method: Explanation, Demonstration, Verbal cues, and Handouts Education comprehension: verbalized understanding, returned demonstration, and verbal cues required   HOME EXERCISE PROGRAM: Access Code: [email or web address] URL: [email or web address] Date: 01/11/2023 Prepared by: Bradly Chris  Exercises - Seated Knee Extension Stretch with Chair  - 1 x daily - 1 reps - 5-10 min hold - Prone Knee Extension with Ankle Weight  - 1 x daily - 1 reps - 5-10 min hold - Seated Hamstring Stretch  - 1 x daily - 3 reps - 60 sec  hold - Prone Quadriceps Stretch with Strap  - 1 x daily - 3 reps - 60 sec hold - Sidelying Hip Adduction  - 3 x weekly - 3 sets - 10 reps - Forward Step Up with Counter Support  - 3 x weekly - 3 sets - 10 reps - Sit to Stand  - 3 x weekly - 3 sets - 10 reps - Lateral Step Up with Counter Support  - 3 x  weekly - 3 sets - 10 reps  Patient Education - Scar Massage   ASSESSMENT:   CLINICAL IMPRESSION:  Pt continues to show improvement with LE strength with ability to perform side step ups and weighted sit to stand and overground walking without an AD. He did not have any increase in his right knee pain after performing exercises. 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    OBJECTIVE IMPAIRMENTS: Abnormal gait, decreased balance, decreased endurance, decreased mobility, difficulty walking, decreased ROM, decreased strength, increased edema, and pain.    ACTIVITY LIMITATIONS: bending, standing, squatting, stairs, bed mobility, dressing, and locomotion level   PARTICIPATION LIMITATIONS: laundry, shopping, community activity, occupation, and yard work   PERSONAL FACTORS: Age, Profession, and 1 comorbidity: HTN   are also affecting patient's functional outcome.    REHAB POTENTIAL: Good   CLINICAL DECISION MAKING: Stable/uncomplicated   EVALUATION COMPLEXITY: Low     GOALS: Goals reviewed with patient? No   SHORT TERM GOALS: Target date: 12/09/2022   Pt will be independent with HEP in order to improve strength and balance in order to decrease fall risk and improve function at home and work. Baseline: Given initial HEP  Goal status: ONGOING    2.  Patient will demonstrate understanding of scar tissue massage technique and perform independently.  Baseline: Awaiting scaps to heal  12/28/22: Demonstrated ability to perform scar massage independently  Goal status: Achieved      LONG TERM GOALS: Target date: 02/03/2023   Patient will have improved function and activity level as evidenced by an increase in FOTO score by 10 points or more.  Baseline: 31   12/28/22: 52  Goal status: Achieved     2.  Patient will improve right knee ROM flexion and extension to be nearly  symmetrical to left knee AROM.   Baseline: Knee Flex R/L 60*/130, Knee Ext R/L 0*/15   12/28/22: Knee Flex R/L 105/135, Knee Ext R/L 0/4 01/07/23: Knee Ext R/L 4/0, Knee Flex R/L 104/140  Goal status: Progressing    3.  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  Baseline: Right knee extension unable to test, Left knee extension 5/5 12/28/22: Knee Ext R/L 4+/5  Goal status: Achieved    4.  Patient will return to walking without an assistive device as a sign of right knee healing.  Baseline: Ambulating with RW  12/28/22: Currently using SPC  Goal status: ONGOING        PLAN:   PT FREQUENCY: 1-2x/week   PT DURATION: 10 weeks   PLANNED INTERVENTIONS: Therapeutic exercises, Neuromuscular re-education, Balance training, Gait training, Patient/Family education, Self Care, Joint mobilization, Joint manipulation, DME instructions, Electrical stimulation, Manual therapy, and Re-evaluation, Dry needling, Cryotherapy, Aquatic therapy    PLAN FOR NEXT SESSION: Decrease setting for stationary bike seat and increase resistance. Gait analysis and continue with progressing quad exercises. SLS   Bradly Chris PT, DPT

## 2023-01-13 ENCOUNTER — Ambulatory Visit: Payer: Medicare HMO | Admitting: Physical Therapy

## 2023-01-13 ENCOUNTER — Encounter: Payer: Self-pay | Admitting: Physical Therapy

## 2023-01-13 DIAGNOSIS — M25561 Pain in right knee: Secondary | ICD-10-CM | POA: Diagnosis not present

## 2023-01-13 DIAGNOSIS — Z96651 Presence of right artificial knee joint: Secondary | ICD-10-CM

## 2023-01-13 NOTE — Therapy (Signed)
OUTPATIENT PHYSICAL THERAPY TREATMENT NOTE  Patient Name: Sergio [name] MRN: [id number] DOB:1951/07/15, 72 y.o., male Today's Date: 01/13/2023  PCP: Dr. Marlowe Sax  REFERRING PROVIDER: Dr. Charlies Constable   END OF SESSION:   PT End of Session - 01/13/23 1329     Visit Number 12    Number of Visits 20    Date for PT Re-Evaluation 02/03/23    Authorization Type [employer]    Authorization Time Period 11/25/22-02/03/23    Authorization - Visit Number 12    Authorization - Number of Visits 20    Progress Note Due on Visit 10    PT Start Time 1330    PT Stop Time 1415    PT Time Calculation (min) 45 min    Equipment Utilized During Treatment Gait belt    Activity Tolerance Patient tolerated treatment well    Behavior During Therapy WFL for tasks assessed/performed                Past Medical History:  Diagnosis Date   Arthritis    Enlarged prostate    Erectile dysfunction    History of colonoscopy    History of COVID-19    Hypertension    Low testosterone    Low testosterone    Pneumonia    2022   Past Surgical History:  Procedure Laterality Date   COLONOSCOPY     RETINAL DETACHMENT SURGERY Right    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   Procedure: TOTAL KNEE ARTHROPLASTY;  Surgeon: Willaim Sheng, MD;  Location: [facility];  Service: Orthopedics;  Laterality: Right;   WISDOM TOOTH EXTRACTION     Patient Active Problem List   Diagnosis Date Noted   Localized osteoarthritis of right knee 11/23/2022   Nonspecific abnormal electrocardiogram (ECG) (EKG) 10/06/2022   Pre-op evaluation 10/06/2022   Essential hypertension 07/09/2021   Erectile dysfunction 07/09/2021   Benign prostatic hyperplasia without lower urinary tract symptoms 07/09/2021   Secondary erythrocytosis 10/25/2018    REFERRING DIAG: S/p right total knee   THERAPY DIAG:  Acute pain of right knee  Hx of total knee arthroplasty, right  Rationale for Evaluation and Treatment  Rehabilitation  PERTINENT HISTORY: Pt started experiencing right knee pain in February at which point he went to [facility] and received an injection. He continued to experience pain even after the injection. Pt then went for TKA.    PRECAUTIONS: None   SUBJECTIVE:  SUBJECTIVE STATEMENT:  Pt reports that his knee is feeling sore this morning. He has been walking around his house without cane and he walks around outside with cane for stability.   PAIN:  Are you having pain? Yes: NPRS scale: 2/10 Pain location: Right knee joint line  Pain description: Aching  Aggravating factors: Straightening knee  Relieving factors: Keep still and bending it a little bit    OBJECTIVE: (objective measures completed at initial evaluation unless otherwise dated)      TODAY'S TREATMENT:                                                                                                                              DATE:   01/13/23: Overground ambulation with no AD around front and back parking lots -1000 ft  -min VC for heel to toe on RLE  Side Step up on 4 inch step 1 x 10  Side Step up on 6 inch step 1 x 10  Side Step down on 4 inch step on RLE 2 x 10  Straight leg raise on RLE 1 x 10   Knee ROM: Extension R/L 2/0  Flexion R/L 110/135   Knee Flexion AROM in sitting on RLE 1 x 10 Prone Quad Stretch on RLE 3 x 30 sec  -Pt reports increased pain with flexion beyond 110  Standing right knee flexion on step 2 x 15     01/11/23: Overground ambulation with no AD around front and back parking lot loops-1000 ft x 2  -min VC for heel to toe ambulation   Sit to Stand 1 x 10  Sit to Stand with #8 DB 1 x 10  Sit to Stand with #8 DB 1 x 10   01/07/23:  Matrix recumbent bicycle set to 13 with no resistance for 5 min  -Pt displays  significant pain  Knee Ext RLE MMT 5/5  Knee Ext AROM R/L 4/0 Knee Ext PROM R/L 3/-1 Knee Flex AROM R/L 105/140 Knee Flex PROM R/L 107/140  Prone Quad Stretch 3 x 60 sec  Short Arc Quad using [employer]- started 60 degrees flexion -AROM to 30  -PROM to 35  -lacking 5 degrees extension  Step up on 4 inch step on RLE 1 x 10  Step up on 8 inch step on RLE with 1 UE support 1 x 10  Step up on 6 inch step on RLE with 1 UE support  3 x 10   Gave pt TKA rehab protocol and explained that we are in 7-12 week range with goals and exercises.    12/21/22  Nu-Step level 9 for arms and feet with level 1 resistance for 5 min  Knee Ext R/L 15 deg/ 0 deg   Left Short Arc Quad PROM>AROM   NMES with performance of SAQ x 20 Wavefore: [place] Mode: Single   Intensity: 85 mA CC   Duration: 10 min  Cycle Time: 10/50  Ramp: 2 sec     PATIENT EDUCATION:  Education details: form and technique for appropriate exercise and explanation about modalities for decreased swelling and pain relief  Person educated: Patient and wife  Education method: Explanation, Demonstration, Verbal cues, and Handouts Education comprehension: verbalized understanding, returned demonstration, and verbal cues required   HOME EXERCISE PROGRAM: Access Code: [email or web address] URL: [email or web address] Date: 01/13/2023 Prepared by: Bradly Chris  Exercises - Standing Knee Flexion Stretch on Step  - 1 x daily - 3 sets - 15 reps - Long Sitting Calf Stretch with Strap  - 1 x daily - 3 reps - 30-60 sec hold - Prone Quadriceps Stretch with Strap  - 1 x daily - 3 reps - 60 sec hold - Sidelying Hip Adduction  - 3 x weekly - 3 sets - 10 reps - Forward Step Up with Counter Support  - 3 x weekly - 3 sets - 10 reps - Sit to Stand  - 3 x weekly - 3 sets - 10 reps - Side Step Down with Counter Support  - 3 x weekly - 3 sets - 10 reps - Seated Knee Flexion AAROM  - 1 x daily - 3 sets - 10 reps  Patient Education -  Scar Massage  ASSESSMENT:   CLINICAL IMPRESSION:  Pt shows an improvement with right knee extension, but continues to show limited right knee flexion. Right quad strength much improved with ability to perform straight leg raises with no lag and side step down. Majority of session focused on modifying home exercise to include knee flexion ROM. Pt limited by pain and increased stiffness at around 110 degrees flexion. 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   OBJECTIVE IMPAIRMENTS: Abnormal gait, decreased balance, decreased endurance, decreased mobility, difficulty walking, decreased ROM, decreased strength, increased edema, and pain.    ACTIVITY LIMITATIONS: bending, standing, squatting, stairs, bed mobility, dressing, and locomotion level   PARTICIPATION LIMITATIONS: laundry, shopping, community activity, occupation, and yard work   PERSONAL FACTORS: Age, Profession, and 1 comorbidity: HTN   are also affecting patient's functional outcome.    REHAB POTENTIAL: Good   CLINICAL DECISION MAKING: Stable/uncomplicated   EVALUATION COMPLEXITY: Low     GOALS: Goals reviewed with patient? No   SHORT TERM GOALS: Target date: 12/09/2022   Pt will be independent with HEP in order to improve strength and balance in order to decrease fall risk and improve function at home and work. Baseline: Given initial HEP  Goal status: ONGOING    2.  Patient will demonstrate understanding of scar tissue massage technique and perform independently.  Baseline: Awaiting scaps to heal  12/28/22: Demonstrated ability to perform scar massage independently  Goal status: Achieved      LONG TERM GOALS: Target date: 02/03/2023   Patient will have improved function and activity level as evidenced by an increase in FOTO score by 10 points or more.  Baseline: 31   12/28/22: 52  Goal status: Achieved      2.  Patient will improve right knee ROM flexion and extension to be nearly symmetrical to left knee AROM.   Baseline: Knee Flex R/L 60*/130, Knee Ext R/L 0*/15   12/28/22: Knee Flex R/L 105/135, Knee Ext R/L 0/4 01/07/23: Knee Ext R/L 4/0, Knee Flex R/L 104/140  Goal status: Progressing    3.  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  Baseline: Right knee extension unable to test, Left knee extension 5/5 12/28/22: Knee Ext R/L 4+/5  Goal status: Achieved    4.  Patient will return to walking without an assistive device as a sign of right knee healing.  Baseline: Ambulating with RW  12/28/22: Currently using SPC  Goal status: ONGOING        PLAN:   PT FREQUENCY: 1-2x/week   PT DURATION: 10 weeks   PLANNED INTERVENTIONS: Therapeutic exercises, Neuromuscular re-education, Balance training, Gait training, Patient/Family education, Self Care, Joint mobilization, Joint manipulation, DME instructions, Electrical stimulation, Manual therapy, and Re-evaluation, Dry needling, Cryotherapy, Aquatic therapy    PLAN FOR NEXT SESSION: Continue to progress knee flexion ROM and progress quad and glute strengthening exercises   Ellin Goodie PT, DPT

## 2023-01-15 ENCOUNTER — Other Ambulatory Visit: Payer: Self-pay | Admitting: Family

## 2023-01-15 DIAGNOSIS — I10 Essential (primary) hypertension: Secondary | ICD-10-CM

## 2023-01-16 ENCOUNTER — Other Ambulatory Visit: Payer: Self-pay | Admitting: Nurse Practitioner

## 2023-01-16 DIAGNOSIS — I10 Essential (primary) hypertension: Secondary | ICD-10-CM

## 2023-01-18 ENCOUNTER — Ambulatory Visit: Payer: Medicare HMO | Admitting: Physical Therapy

## 2023-01-18 DIAGNOSIS — M25561 Pain in right knee: Secondary | ICD-10-CM | POA: Diagnosis not present

## 2023-01-18 DIAGNOSIS — Z96651 Presence of right artificial knee joint: Secondary | ICD-10-CM

## 2023-01-18 NOTE — Therapy (Signed)
OUTPATIENT PHYSICAL THERAPY TREATMENT NOTE  Patient Name: Sergio DEBORDE MRN: [id number] DOB:1951-09-05, 72 y.o., male Today's Date: 01/18/2023  PCP: Dr. Marlowe Sax  REFERRING PROVIDER: Dr. Charlies Constable   END OF SESSION:   PT End of Session - 01/18/23 0940     Visit Number 13    Number of Visits 20    Date for PT Re-Evaluation 02/03/23    Authorization Type [employer]    Authorization Time Period 11/25/22-02/03/23    Authorization - Visit Number 13    Authorization - Number of Visits 20    Progress Note Due on Visit 10    PT Start Time 0930    PT Stop Time 1015    PT Time Calculation (min) 45 min    Equipment Utilized During Treatment Gait belt    Activity Tolerance Patient tolerated treatment well    Behavior During Therapy WFL for tasks assessed/performed                 Past Medical History:  Diagnosis Date   Arthritis    Enlarged prostate    Erectile dysfunction    History of colonoscopy    History of COVID-19    Hypertension    Low testosterone    Low testosterone    Pneumonia    2022   Past Surgical History:  Procedure Laterality Date   COLONOSCOPY     RETINAL DETACHMENT SURGERY Right    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   Procedure: TOTAL KNEE ARTHROPLASTY;  Surgeon: Willaim Sheng, MD;  Location: [facility];  Service: Orthopedics;  Laterality: Right;   WISDOM TOOTH EXTRACTION     Patient Active Problem List   Diagnosis Date Noted   Localized osteoarthritis of right knee 11/23/2022   Nonspecific abnormal electrocardiogram (ECG) (EKG) 10/06/2022   Pre-op evaluation 10/06/2022   Essential hypertension 07/09/2021   Erectile dysfunction 07/09/2021   Benign prostatic hyperplasia without lower urinary tract symptoms 07/09/2021   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 He continued to experience pain even after the injection. Pt then went for TKA.    PRECAUTIONS: None   SUBJECTIVE:  SUBJECTIVE STATEMENT:  Pt reports increased left knee pain with sit to stands and he is worried about doing the exercise.  PAIN:  Are you having pain? Yes: NPRS scale: 2/10 Pain location: Right knee joint line  Pain description: Aching  Aggravating factors: Straightening knee  Relieving factors: Keep still and bending it a little bit    OBJECTIVE: (objective measures completed at initial evaluation unless otherwise dated)      TODAY'S TREATMENT:                                                                                                                              DATE:   01/18/23:  Matrix Recumbent Bicycle with resistance of 2 and seat setting of 13 for 5 min  Sit to Stand from 18 inch mat height 1 x 10  -Pt reports increased pain in right knee  Sit to Stand from 22 inch mat height with #8 lb 1 x 10  Side Step Down  on 4 inch step with BUE support 2 x 10  -no increased pain in right knee  Side Step Down on 4 inch step with 1 UE support 2 x 10  -Performed on RLE and LLE only using 1 UE support  Forward Step Downs with BUE support with railings 2 x 10  - From 6 inch step for BUE support   Reviewed patella mobilizations with patient using video: https://youtu.[email or web address] -Four way patella mobilizations   01/13/23: Overground ambulation with no AD around front and back parking lots -1000 ft  -min VC for heel to toe on RLE  Side Step up on 4 inch step 1 x 10  Side Step up on 6 inch step 1 x 10  Side Step down on 4 inch step on RLE 2  x 10  Straight leg raise on RLE 1 x 10   Knee ROM: Extension R/L 2/0  Flexion R/L 110/135   Knee Flexion AROM in sitting on RLE 1 x 10 Prone Quad Stretch on RLE 3 x 30 sec  -Pt reports increased pain with flexion beyond 110  Standing right knee flexion on step 2 x 15     01/11/23: Overground ambulation with no AD around front and back parking lot loops-1000 ft x 2  -min VC for heel to toe ambulation   Sit to Stand 1 x 10  Sit to Stand with #8 DB 1 x 10  Sit to Stand with #8 DB 1 x 10   01/07/23:  Matrix recumbent bicycle set to 13 with no resistance for 5 min  -Pt displays significant pain  Knee Ext RLE MMT 5/5  Knee Ext AROM R/L 4/0 Knee Ext PROM R/L 3/-1 Knee Flex AROM R/L 105/140 Knee Flex PROM R/L 107/140  Prone Quad Stretch 3 x 60 sec  Short Arc Quad using [employer]- started 60 degrees flexion -AROM to 30  -PROM to 35  -lacking 5 degrees extension  Step up  on 4 inch step on RLE 1 x 10  Step up on 8 inch step on RLE with 1 UE support 1 x 10  Step up on 6 inch step on RLE with 1 UE support  3 x 10   Gave pt TKA rehab protocol and explained that we are in 7-12 week range with goals and exercises.    12/21/22  Nu-Step level 9 for arms and feet with level 1 resistance for 5 min  Knee Ext R/L 15 deg/ 0 deg   Left Short Arc Quad PROM>AROM   NMES with performance of SAQ x 20 Wavefore: [place] Mode: Single   Intensity: 85 mA CC   Duration: 10 min  Cycle Time: 10/50   Ramp: 2 sec     PATIENT EDUCATION:  Education details: form and technique for appropriate exercise and explanation about modalities for decreased swelling and pain relief  Person educated: Patient and wife  Education method: Consulting civil engineer, Media planner, Verbal cues, and Handouts Education comprehension: verbalized understanding, returned demonstration, and verbal cues required   HOME EXERCISE PROGRAM: Access Code: [email or web address] URL: [email or web address] Date:  01/18/2023 Prepared by: Bradly Chris  Exercises - Standing Knee Flexion Stretch on Step  - 1 x daily - 3 sets - 15 reps - Long Sitting Calf Stretch with Strap  - 1 x daily - 3 reps - 30-60 sec hold - Prone Quadriceps Stretch with Strap  - 1 x daily - 3 reps - 60 sec hold - Sidelying Hip Adduction  - 3 x weekly - 3 sets - 10 reps - Forward Step Up with Counter Support  - 3 x weekly - 3 sets - 10 reps - Sit to Stand  - 3 x weekly - 3 sets - 10 reps - Side Step Down with Counter Support  - 3 x weekly - 3 sets - 10 reps - Seated Knee Flexion AAROM  - 1 x daily - 3 sets - 10 reps - Long Sitting Lateral Patellar Glide  - 1 x daily - 3 reps - 30 sec  hold - Long Sitting Inferior Patellar Glide  - 1 x daily - 3 reps - 30 sec  hold - Long Sitting Medial Patellar Glide  - 1 x daily - 3 reps - 30 sec hold - Long Sitting Superior Patellar Glide  - 1 x daily - 3 reps - 30 sec hold  Patient Education - Scar Massage ASSESSMENT:   CLINICAL IMPRESSION: Pt presents s/p 8 weeks right TKA. He continues to show improvement with right knee strength with ability to perform eccentric exercises albeit with BUE support. He does appear to be limited by left knee pain so sit to stand modified to include decreased depth to reduce knee pain. 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  OBJECTIVE IMPAIRMENTS: Abnormal gait, decreased balance, decreased endurance, decreased mobility, difficulty walking, decreased ROM, decreased strength, increased edema, and pain.    ACTIVITY LIMITATIONS: bending, standing, squatting, stairs, bed mobility, dressing, and locomotion level   PARTICIPATION LIMITATIONS: laundry, shopping, community activity, occupation, and yard work   PERSONAL FACTORS: Age, Profession, and 1 comorbidity: HTN   are also affecting patient's functional outcome.    REHAB POTENTIAL: Good    CLINICAL DECISION MAKING: Stable/uncomplicated   EVALUATION COMPLEXITY: Low     GOALS: Goals reviewed with patient? No   SHORT TERM GOALS: Target date: 12/09/2022  Pt will be independent with HEP in order to improve strength and balance in order to decrease fall risk and improve function at home and work. Baseline: Given initial HEP  Goal status: ONGOING    2.  Patient will demonstrate understanding of scar tissue massage technique and perform independently.  Baseline: Awaiting scaps to heal  12/28/22: Demonstrated ability to perform scar massage independently  Goal status: Achieved      LONG TERM GOALS: Target date: 02/03/2023   Patient will have improved function and activity level as evidenced by an increase in FOTO score by 10 points or more.  Baseline: 31   12/28/22: 52  Goal status: Achieved     2.  Patient will improve right knee ROM flexion and extension to be nearly symmetrical to left knee AROM.   Baseline: Knee Flex R/L 60*/130, Knee Ext R/L 0*/15   12/28/22: Knee Flex R/L 105/135, Knee Ext R/L 0/4 01/07/23: Knee Ext R/L 4/0, Knee Flex R/L 104/140  Goal status: Progressing    3.  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  Baseline: Right knee extension unable to test, Left knee extension 5/5 12/28/22: Knee Ext R/L 4+/5  Goal status: Achieved    4.  Patient will return to walking without an assistive device as a sign of right knee healing.  Baseline: Ambulating with RW  12/28/22: Currently using SPC  Goal status: ONGOING        PLAN:   PT FREQUENCY: 1-2x/week   PT DURATION: 10 weeks   PLANNED INTERVENTIONS: Therapeutic exercises, Neuromuscular re-education, Balance training, Gait training, Patient/Family education, Self Care, Joint mobilization, Joint manipulation, DME instructions, Electrical stimulation, Manual therapy, and Re-evaluation, Dry needling,  Cryotherapy, Aquatic therapy    PLAN FOR NEXT SESSION: Continue to progress knee flexion ROM and progress quad and glute strengthening exercises: Single leg stance exercise   Bradly Chris PT, DPT

## 2023-01-25 ENCOUNTER — Encounter: Payer: Self-pay | Admitting: Adult Health

## 2023-01-25 ENCOUNTER — Ambulatory Visit: Payer: Medicare HMO | Admitting: Physical Therapy

## 2023-01-25 ENCOUNTER — Ambulatory Visit (INDEPENDENT_AMBULATORY_CARE_PROVIDER_SITE_OTHER): Payer: Medicare HMO | Admitting: Adult Health

## 2023-01-25 VITALS — BP 130/78 | HR 85 | Temp 97.5°F | Resp 18 | Ht 68.0 in | Wt 179.1 lb

## 2023-01-25 DIAGNOSIS — M1711 Unilateral primary osteoarthritis, right knee: Secondary | ICD-10-CM

## 2023-01-25 DIAGNOSIS — J069 Acute upper respiratory infection, unspecified: Secondary | ICD-10-CM | POA: Diagnosis not present

## 2023-01-25 DIAGNOSIS — I10 Essential (primary) hypertension: Secondary | ICD-10-CM

## 2023-01-25 LAB — POCT INFLUENZA A/B
Influenza A, POC: NEGATIVE
Influenza B, POC: NEGATIVE

## 2023-01-25 LAB — POC COVID19 BINAXNOW: SARS Coronavirus 2 Ag: NEGATIVE

## 2023-01-25 MED ORDER — AZITHROMYCIN 250 MG PO TABS: ORAL_TABLET | ORAL | 0 refills | Status: AC

## 2023-01-25 NOTE — Progress Notes (Signed)
[facility]  Provider:   Durenda Age DNP  Code Status:  Miyuki Rzasa Medina-Vargas DNP  Goals of Care:     11/25/2022   11:07 AM  Advanced Directives  Does Patient Have a Medical Advance Directive? Yes  Type of Advance Directive [place]  Does patient want to make changes to medical advance directive? No - Patient declined  Copy of [place] in Chart? Yes - validated most recent copy scanned in chart (See row information)     Chief Complaint  Patient presents with   Acute Visit    Patient states that she is not feeling well    HPI: Patient is a 72 y.o. male seen today for an acute visit for nasal congestion. 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 Wife was sick last week and currently on antibiotics. He has nasal darainage and productive cough with yellowish phlegm. He stated that he had chills at first. He denies fever nor shortness of breath.   COVID-19 and Influenza A/B test were negative today.  BP 130/78, takes HCTZ and Lisinopril  He had right knee arthroplasty on 11/23/22. He has PT 1-2X/week, currently on medical leave.  Past Medical History:  Diagnosis Date   Arthritis    Enlarged prostate    Erectile dysfunction    History of colonoscopy    History of COVID-19    Hypertension    Low testosterone    Low testosterone    Pneumonia    2022    Past Surgical History:  Procedure Laterality Date   COLONOSCOPY     RETINAL DETACHMENT SURGERY Right    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   Procedure: TOTAL KNEE ARTHROPLASTY;  Surgeon: Willaim Sheng, MD;  Location: [facility];  Service: Orthopedics;  Laterality: Right;   WISDOM TOOTH EXTRACTION      No Known Allergies  Outpatient Encounter Medications as of 01/25/2023  Medication Sig   ascorbic acid (VITAMIN C) 500 MG tablet Take 500 mg by mouth daily.   aspirin EC 81 MG tablet Take 81 mg by mouth daily.   hydrochlorothiazide (HYDRODIURIL) 25 MG tablet TAKE 1  TABLET (25 MG TOTAL) BY MOUTH DAILY.   lisinopril (ZESTRIL) 20 MG tablet TAKE 1 TABLET BY MOUTH EVERY DAY   Multiple Vitamin (MULTIVITAMIN) tablet Take 1 tablet by mouth daily.   sildenafil (REVATIO) 20 MG tablet Take 1 tablet (20 mg total) by mouth as needed.   silodosin (RAPAFLO) 8 MG CAPS capsule Take 1 capsule (8 mg total) by mouth daily with breakfast.   Testosterone Cypionate 100 MG/ML SOLN 10% GEL. 4 clicks daily.   No facility-administered encounter medications on file as of 01/25/2023.    Review of Systems:  Review of Systems  Constitutional:  Positive for chills. Negative for activity change, appetite change and fever.  HENT:  Negative for sore throat.   Eyes: Negative.   Respiratory:  Positive for cough.        Productive cough with yellowish phlegm  Cardiovascular:  Negative for chest pain and leg swelling.  Gastrointestinal:  Negative for abdominal distention, diarrhea and vomiting.  Genitourinary:  Negative for dysuria, frequency and urgency.  Skin:  Negative for color change.  Neurological:  Negative for dizziness and headaches.  Psychiatric/Behavioral:  Negative for behavioral problems and sleep disturbance. The patient is not nervous/anxious.     Health Maintenance  Topic Date Due   Medicare Annual Wellness (AWV)  Never done   DTaP/Tdap/Td (1 - Tdap)  Never done   Zoster Vaccines- Shingrix (1 of 2) Never done   Pneumonia Vaccine 21+ Years old (1 of 1 - PCV) Never done   INFLUENZA VACCINE  07/07/2022   COVID-19 Vaccine (3 - 2023-24 season) 08/07/2022   COLONOSCOPY (Pts 45-27yr Insurance coverage will need to be confirmed)  10/18/2030   Hepatitis C Screening  Completed   HPV VACCINES  Aged Out    Physical Exam: Vitals:   01/25/23 0846  BP: 130/78  Pulse: 85  Resp: 18  Temp: (!) 97.5 F (36.4 C)  SpO2: 96%  Weight: 179 lb 2 oz (81.3 kg)  Height: 5' 8"$  (1.727 m)   Body mass index is 27.24 kg/m. Physical Exam Constitutional:      Appearance: Normal  appearance.  HENT:     Head: Normocephalic and atraumatic.     Mouth/Throat:     Mouth: Mucous membranes are moist.  Eyes:     Conjunctiva/sclera: Conjunctivae normal.  Cardiovascular:     Rate and Rhythm: Normal rate and regular rhythm.     Pulses: Normal pulses.     Heart sounds: Normal heart sounds.  Pulmonary:     Effort: Pulmonary effort is normal.     Breath sounds: Normal breath sounds.  Abdominal:     General: Bowel sounds are normal.     Palpations: Abdomen is soft.  Musculoskeletal:        General: No swelling. Normal range of motion.     Cervical back: Normal range of motion.  Skin:    General: Skin is warm and dry.  Neurological:     General: No focal deficit present.     Mental Status: He is alert and oriented to person, place, and time.  Psychiatric:        Mood and Affect: Mood normal.        Behavior: Behavior normal.        Thought Content: Thought content normal.        Judgment: Judgment normal.     Labs reviewed: Basic Metabolic Panel: Recent Labs    07/31/22 0917 11/17/22 1000 11/24/22 0410  NA 134* 131* 132*  K 4.2 3.8 3.8  CL 100 99 102  CO2 24 24 23  $ GLUCOSE 96 92 162*  BUN 10 12 15  $ CREATININE 1.16 0.83 1.02  CALCIUM 9.1 9.3 8.7*  TSH 1.41  --   --    Liver Function Tests: Recent Labs    07/31/22 0917  AST 23  ALT 24  BILITOT 1.3*  PROT 6.7   No results for input(s): "LIPASE", "AMYLASE" in the last 8760 hours. No results for input(s): "AMMONIA" in the last 8760 hours. CBC: Recent Labs    07/31/22 0917 11/02/22 0819 11/24/22 0410  WBC 4.7 5.6 10.2  NEUTROABS 2,834 3.4  --   HGB 15.2 15.3 13.1  HCT 50.1* 48.7 42.0  MCV 71.4* 69.9* 70.2*  PLT 188 218 198   Lipid Panel: Recent Labs    07/31/22 0917  CHOL 152  HDL 42  LDLCALC 91  TRIG 91  CHOLHDL 3.6   No results found for: "HGBA1C"  Procedures since last visit: No results found.  Assessment/Plan  1. Upper respiratory infection with cough and congestion -  POC COVID-19 negative - POC Influenza A/B negative - azithromycin (ZITHROMAX) 250 MG tablet; Take 2 tablets on day 1, then 1 tablet daily on days 2 through 5  Dispense: 6 tablet; Refill: 0 -  instructed  to drink plenty  of fluids  2. Essential hypertension -  BPs stable -  continue HCTZ and Lisinopril  3. Localized osteoarthritis of right knee -  S/P arthroplasty on 11/23/22 -  continue PT at outpatient rehab -  continue therapeutic strengthening exercises    Labs/tests ordered:  POC COVID-19 and A/B Influenza test  Next appt:  02/05/2023

## 2023-01-25 NOTE — Patient Instructions (Signed)

## 2023-01-27 ENCOUNTER — Encounter: Payer: Self-pay | Admitting: Physical Therapy

## 2023-01-27 ENCOUNTER — Ambulatory Visit: Payer: Medicare HMO | Admitting: Physical Therapy

## 2023-01-27 DIAGNOSIS — M25561 Pain in right knee: Secondary | ICD-10-CM

## 2023-01-27 DIAGNOSIS — Z96651 Presence of right artificial knee joint: Secondary | ICD-10-CM

## 2023-01-27 NOTE — Therapy (Signed)
OUTPATIENT PHYSICAL THERAPY TREATMENT NOTE  Patient Name: Sergio [name] MRN: [id number] DOB:September 12, 1951, 72 y.o., male Today's Date: 01/27/2023  PCP: Dr. Marlowe Sax  REFERRING PROVIDER: Dr. Charlies Constable   END OF SESSION:   PT End of Session - 01/27/23 1425     Visit Number 14    Number of Visits 20    Date for PT Re-Evaluation 02/03/23    Authorization Type [employer]    Authorization Time Period 11/25/22-02/03/23    Authorization - Visit Number 14    Authorization - Number of Visits 20    Progress Note Due on Visit 20    PT Start Time [id number]    PT Stop Time 1500    PT Time Calculation (min) 40 min    Equipment Utilized During Treatment Gait belt    Activity Tolerance Patient tolerated treatment well    Behavior During Therapy WFL for tasks assessed/performed                 Past Medical History:  Diagnosis Date   Arthritis    Enlarged prostate    Erectile dysfunction    History of colonoscopy    History of COVID-19    Hypertension    Low testosterone    Low testosterone    Pneumonia    2022   Past Surgical History:  Procedure Laterality Date   COLONOSCOPY     RETINAL DETACHMENT SURGERY Right    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   Procedure: TOTAL KNEE ARTHROPLASTY;  Surgeon: Willaim Sheng, MD;  Location: [facility];  Service: Orthopedics;  Laterality: Right;   WISDOM TOOTH EXTRACTION     Patient Active Problem List   Diagnosis Date Noted   Localized osteoarthritis of right knee 11/23/2022   Nonspecific abnormal electrocardiogram (ECG) (EKG) 10/06/2022   Pre-op evaluation 10/06/2022   Essential hypertension 07/09/2021   Erectile dysfunction 07/09/2021   Benign prostatic hyperplasia without lower urinary tract symptoms 07/09/2021   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 He continued to experience pain even after the injection. Pt then went for TKA.    PRECAUTIONS: None   SUBJECTIVE:  SUBJECTIVE STATEMENT:  Pt reports feeling sick last session which is why he cancelled. He reports still feel some weakness in the left leg but that he is improving overall.    PAIN:  Are you having pain? No   OBJECTIVE: (objective measures completed at initial evaluation unless otherwise dated)      TODAY'S TREATMENT:                                                                                                                              DATE:   01/27/23: Matrix Recumbent Bicycle with resistance of 4 and seat setting of 13 for 5 min  RLE Single leg balance 5 x 10 sec  Forward step down off 6 inch step on RLE with 1 UE support  -Pt unable to control lowering due to R knee weakness  Forward step down off 4 inch step on RLE with 1 UE support  -Pt is still unable to perform due to R knee weakness  Forward step down off 6 inch step on RLE with BUE support  -mod VC to decrease speed of exercise  Side Step down on 6 inch step on RLE with BUE support using stair rail  -mod VC to decrease speed of eccentric phase  Side Step down on 4 inch step on RLE with BUE support using mat  Sit to stand from 18 inch mat height 1 x 10  -min VC to inhale when standing and exhale when sitting  -min VC to increase speed of stand and decrease speed of sitting   01/18/23:  Matrix Recumbent Bicycle with resistance of 2 and seat setting of 13 for 5 min  Sit to Stand from 18 inch mat height 1 x 10  -Pt reports increased pain in right knee  Sit to Stand from 22  inch mat height with #8 lb 1 x 10  Side Step Down  on 4 inch step with BUE support 2 x 10  -no increased pain in right knee  Side Step Down on 4 inch step with 1 UE support 2 x 10  -Performed on RLE and LLE only using 1 UE support  Forward Step Downs with BUE support with railings 2 x 10  - From 6 inch step for BUE support   Reviewed patella mobilizations with patient using video: https://youtu.[email or web address] -Four way patella mobilizations    01/07/23:  Matrix recumbent bicycle set to 13 with no resistance for 5 min  -Pt displays significant pain  Knee Ext RLE MMT 5/5  Knee Ext AROM R/L 4/0 Knee Ext PROM R/L 3/-1 Knee Flex AROM R/L 105/140 Knee Flex PROM R/L 107/140  Prone Quad Stretch 3 x 60 sec  Short Arc Quad using [employer]- started 60 degrees flexion -AROM to 30  -PROM to 35  -lacking 5 degrees extension  Step up on 4 inch step on RLE 1 x 10  Step up on 8 inch step on RLE with  1 UE support 1 x 10  Step up on 6 inch step on RLE with 1 UE support  3 x 10   Gave pt TKA rehab protocol and explained that we are in 7-12 week range with goals and exercises.    12/21/22  Nu-Step level 9 for arms and feet with level 1 resistance for 5 min  Knee Ext R/L 15 deg/ 0 deg   Left Short Arc Quad PROM>AROM   NMES with performance of SAQ x 20 Wavefore: [place] Mode: Single   Intensity: 85 mA CC   Duration: 10 min  Cycle Time: 10/50   Ramp: 2 sec     PATIENT EDUCATION:  Education details: form and technique for appropriate exercise and explanation about modalities for decreased swelling and pain relief  Person educated: Patient and wife  Education method: Consulting civil engineer, Media planner, Verbal cues, and Handouts Education comprehension: verbalized understanding, returned demonstration, and verbal cues required   HOME EXERCISE PROGRAM: Access Code: [email or web address] URL: [email or web address] Date: 01/27/2023 Prepared by: Bradly Chris  Exercises - Standing Knee  Flexion Stretch on Step  - 1 x daily - 3 sets - 15 reps - Seated Knee Flexion AAROM  - 1 x daily - 3 sets - 10 reps - Long Sitting Calf Stretch with Strap  - 1 x daily - 3 reps - 30-60 sec hold - Prone Quadriceps Stretch with Strap  - 1 x daily - 3 reps - 60 sec hold - Sidelying Hip Adduction  - 3 x weekly - 3 sets - 10 reps - Forward Step Up with Counter Support  - 3 x weekly - 3 sets - 10 reps - Side Step Down with Counter Support  - 3 x weekly - 3 sets - 10 reps - Sit to Stand  - 3 x weekly - 3 sets - 10 reps - Single Leg Stance  - 1 x daily - 5 reps - 10 sec  hold - Side Step Down with Counter Support  - 3 x weekly - 3 sets - 10 reps - Long Sitting Lateral Patellar Glide  - 1 x daily - 3 reps - 30 sec  hold - Long Sitting Inferior Patellar Glide  - 1 x daily - 3 reps - 30 sec  hold - Long Sitting Medial Patellar Glide  - 1 x daily - 3 reps - 30 sec hold - Long Sitting Superior Patellar Glide  - 1 x daily - 3 reps - 30 sec hold - Forward Step Down with Heel Tap and Counter Support  - 3 x weekly - 3 sets - 10 reps  Patient Education - Scar Massage  ASSESSMENT:   CLINICAL IMPRESSION: Pt presents s/p 9 weeks right TKA. He shows improvement with right knee strength with ability to perform eccentric step down and side step downs albeit with bilateral UE support. Pt limited throughout session by coughing and shortness of breath and he required several seated rest break. He continues to require mod VC to slow down exercises especially eccentric portion of exercise. 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  OBJECTIVE IMPAIRMENTS: Abnormal gait, decreased balance, decreased endurance, decreased mobility, difficulty walking, decreased ROM, decreased strength, increased edema, and pain.    ACTIVITY LIMITATIONS: bending, standing, squatting, stairs, bed mobility, dressing, and  locomotion level   PARTICIPATION LIMITATIONS: laundry, shopping, community activity, occupation, and yard work   PERSONAL  FACTORS: Age, Profession, and 1 comorbidity: HTN   are also affecting patient's functional outcome.    REHAB POTENTIAL: Good   CLINICAL DECISION MAKING: Stable/uncomplicated   EVALUATION COMPLEXITY: Low     GOALS: Goals reviewed with patient? No   SHORT TERM GOALS: Target date: 12/09/2022   Pt will be independent with HEP in order to improve strength and balance in order to decrease fall risk and improve function at home and work. Baseline: Given initial HEP  Goal status: ONGOING    2.  Patient will demonstrate understanding of scar tissue massage technique and perform independently.  Baseline: Awaiting scaps to heal  12/28/22: Demonstrated ability to perform scar massage independently  Goal status: Achieved      LONG TERM GOALS: Target date: 02/03/2023   Patient will have improved function and activity level as evidenced by an increase in FOTO score by 10 points or more.  Baseline: 31   12/28/22: 52  Goal status: Achieved     2.  Patient will improve right knee ROM flexion and extension to be nearly symmetrical to left knee AROM.   Baseline: Knee Flex R/L 60*/130, Knee Ext R/L 0*/15   12/28/22: Knee Flex R/L 105/135, Knee Ext R/L 0/4 01/07/23: Knee Ext R/L 4/0, Knee Flex R/L 104/140  Goal status: Progressing    3.  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  Baseline: Right knee extension unable to test, Left knee extension 5/5 12/28/22: Knee Ext R/L 4+/5  Goal status: Achieved    4.  Patient will return to walking without an assistive device as a sign of right knee healing.  Baseline: Ambulating with RW  12/28/22: Currently using SPC  Goal status: ONGOING        PLAN:   PT FREQUENCY: 1-2x/week   PT DURATION: 10 weeks   PLANNED INTERVENTIONS:  Therapeutic exercises, Neuromuscular re-education, Balance training, Gait training, Patient/Family education, Self Care, Joint mobilization, Joint manipulation, DME instructions, Electrical stimulation, Manual therapy, and Re-evaluation, Dry needling, Cryotherapy, Aquatic therapy    PLAN FOR NEXT SESSION: Reassess goals for re-certification and continue to progress right knee mobility and strength exercises.   Bradly Chris PT, DPT

## 2023-02-01 ENCOUNTER — Ambulatory Visit: Payer: Medicare HMO | Admitting: Physical Therapy

## 2023-02-01 DIAGNOSIS — M25561 Pain in right knee: Secondary | ICD-10-CM | POA: Diagnosis not present

## 2023-02-01 DIAGNOSIS — Z96651 Presence of right artificial knee joint: Secondary | ICD-10-CM

## 2023-02-01 NOTE — Therapy (Signed)
OUTPATIENT PHYSICAL THERAPY TREATMENT NOTE/ Re-certification   Dates of reporting: 11/25/22-02/03/23   Patient Name: Sergio [name] MRN: [id number] DOB:[birth year], 72 y.o., male Today's Date: 02/01/2023  PCP: Dr. Marlowe Sax  REFERRING PROVIDER: Dr. Charlies Constable   END OF SESSION:   PT End of Session - 02/01/23 1019     Visit Number 15    Number of Visits 20    Date for PT Re-Evaluation 02/03/23    Authorization Type [employer]    Authorization Time Period 11/25/22-02/03/23    Authorization - Visit Number 15    Authorization - Number of Visits 20    Progress Note Due on Visit 20    PT Start Time 1015    PT Stop Time 1100    PT Time Calculation (min) 45 min    Equipment Utilized During Treatment Gait belt    Activity Tolerance Patient tolerated treatment well    Behavior During Therapy WFL for tasks assessed/performed                  Past Medical History:  Diagnosis Date   Arthritis    Enlarged prostate    Erectile dysfunction    History of colonoscopy    History of COVID-19    Hypertension    Low testosterone    Low testosterone    Pneumonia    2022   Past Surgical History:  Procedure Laterality Date   COLONOSCOPY     RETINAL DETACHMENT SURGERY Right    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   Procedure: TOTAL KNEE ARTHROPLASTY;  Surgeon: Willaim Sheng, MD;  Location: [facility];  Service: Orthopedics;  Laterality: Right;   WISDOM TOOTH EXTRACTION     Patient Active Problem List   Diagnosis Date Noted   Localized osteoarthritis of right knee 11/23/2022   Nonspecific abnormal electrocardiogram (ECG) (EKG) 10/06/2022   Pre-op evaluation 10/06/2022   Essential hypertension 07/09/2021   Erectile dysfunction 07/09/2021   Benign prostatic hyperplasia without lower urinary tract symptoms 07/09/2021   Secondary erythrocytosis 10/25/2018    REFERRING DIAG: S/p right total knee   THERAPY DIAG:  Acute pain of right knee  Hx of total  knee arthroplasty, right  Rationale for Evaluation and Treatment Rehabilitation  PERTINENT HISTORY: Pt started experiencing right knee pain in February at which point he went to [place] and received an injection. He continued to experience pain even after the injection. Pt then went for TKA.    PRECAUTIONS: None   SUBJECTIVE:  SUBJECTIVE STATEMENT:     PAIN:  Are you having pain? No   OBJECTIVE: (objective measures completed at initial evaluation unless otherwise dated)   VITALS: BP 139/89 HR 80 SpO2 79    DIAGNOSTIC FINDINGS:    CLINICAL DATA:  Status post total right knee arthroplasty.   EXAM: PORTABLE RIGHT KNEE - 1-2 VIEW   COMPARISON:  None Available.   FINDINGS: Status post recent total right knee arthroplasty. No perihardware lucency is seen to indicate hardware failure or loosening. Expected postoperative intra-articular and subcutaneous air. Mild-to-moderate joint effusion. No acute fracture or dislocation.   IMPRESSION: Status post total right knee arthroplasty without evidence of hardware failure.     Electronically Signed   By: Yvonne Kendall M.D.   On: 11/23/2022 16:54     PATIENT SURVEYS:  FOTO 31/100 with target of 58   COGNITION: Overall cognitive status: Within functional limits for tasks assessed                         SENSATION: WFL   EDEMA:  Circumferential: R 17.5 inch and L 18 inch    MUSCLE LENGTH: Hamstrings: Right NT ; Left NT   Thomas test: Right NT deg; Left NT    POSTURE: No Significant postural limitations   PALPATION: Patella of right knee    LOWER EXTREMITY ROM:   AROM/PROM Right eval Left eval  Hip flexion      Hip extension      Hip abduction      Hip adduction      Hip internal rotation      Hip external rotation      Knee flexion  60/80* 130/130  Knee extension 15/15* 0/0  Ankle dorsiflexion      Ankle plantarflexion      Ankle inversion      Ankle eversion       (Blank rows = not tested)   LOWER EXTREMITY MMT:   MMT Right eval Left eval  Hip flexion 3 5  Hip extension      Hip abduction      Hip adduction      Hip internal rotation      Hip external rotation      Knee flexion 5 5  Knee extension NT  5  Ankle dorsiflexion      Ankle plantarflexion      Ankle inversion      Ankle eversion       (Blank rows = not tested)   LOWER EXTREMITY SPECIAL TESTS: None performed      FUNCTIONAL TESTS: None performed    GAIT: Distance walked: 50 ft  Assistive device utilized: Walker - 2 wheeled Level of assistance: Modified independence Comments: Decreased stance time on RLE and step length on LLE.     TODAY'S TREATMENT:  DATE:   02/01/23: Matrix Recumbent Bicycle with resistance of 4 and seat setting of 11 for 6 min  FOTO:  Knee ROM:  -AROM Flex R/L 120/140 -PROM Flex R/L  120/140 AROM Ext R/L 0/0   Full kneel position 2 x 60 sec  -min VC to maintain upright posture for increased knee flexion  Reviewed HEP with progressions     01/27/23: Matrix Recumbent Bicycle with resistance of 4 and seat setting of 13 for 5 min  RLE Single leg balance 5 x 10 sec  Forward step down off 6 inch step on RLE with 1 UE support  -Pt unable to control lowering due to R knee weakness  Forward step down off 4 inch step on RLE with 1 UE support  -Pt is still unable to perform due to R knee weakness  Forward step down off 6 inch step on RLE with BUE support  -mod VC to decrease speed of exercise  Side Step down on 6 inch step on RLE with BUE support using stair rail  -mod VC to decrease speed of eccentric phase  Side Step down on 4 inch step on RLE with BUE support using mat  Sit to stand  from 18 inch mat height 1 x 10  -min VC to inhale when standing and exhale when sitting  -min VC to increase speed of stand and decrease speed of sitting   01/18/23:  Matrix Recumbent Bicycle with resistance of 2 and seat setting of 13 for 5 min  Sit to Stand from 18 inch mat height 1 x 10  -Pt reports increased pain in right knee  Sit to Stand from 22 inch mat height with #8 lb 1 x 10  Side Step Down  on 4 inch step with BUE support 2 x 10  -no increased pain in right knee  Side Step Down on 4 inch step with 1 UE support 2 x 10  -Performed on RLE and LLE only using 1 UE support  Forward Step Downs with BUE support with railings 2 x 10  - From 6 inch step for BUE support   Reviewed patella mobilizations with patient using video: https://youtu.[email or web address] -Four way patella mobilizations    01/07/23:  Matrix recumbent bicycle set to 13 with no resistance for 5 min  -Pt displays significant pain  Knee Ext RLE MMT 5/5  Knee Ext AROM R/L 4/0 Knee Ext PROM R/L 3/-1 Knee Flex AROM R/L 105/140 Knee Flex PROM R/L 107/140  Prone Quad Stretch 3 x 60 sec  Short Arc Quad using [employer]- started 60 degrees flexion -AROM to 30  -PROM to 35  -lacking 5 degrees extension  Step up on 4 inch step on RLE 1 x 10  Step up on 8 inch step on RLE with 1 UE support 1 x 10  Step up on 6 inch step on RLE with 1 UE support  3 x 10   Gave pt TKA rehab protocol and explained that we are in 7-12 week range with goals and exercises.    12/21/22  Nu-Step level 9 for arms and feet with level 1 resistance for 5 min  Knee Ext R/L 15 deg/ 0 deg   Left Short Arc Quad PROM>AROM   NMES with performance of SAQ x 20 Wavefore: [place] Channel Mode: Single   Intensity: 85 mA CC   Duration: 10 min  Cycle Time: 10/50   Ramp: 2 sec     PATIENT EDUCATION:  Education details:  form and technique for appropriate exercise and explanation about modalities for decreased swelling and pain relief  Person  educated: Patient and wife  Education method: Explanation, Demonstration, Verbal cues, and Handouts Education comprehension: verbalized understanding, returned demonstration, and verbal cues required   HOME EXERCISE PROGRAM: Access Code: [email or web address] URL: [email or web address] Date: 02/01/2023 Prepared by: Bradly Chris  Exercises - Standing Knee Flexion Stretch on Step  - 1 x daily - 3 sets - 15 reps - Long Sitting Calf Stretch with Strap  - 1 x daily - 3 reps - 30-60 sec hold - Prone Quadriceps Stretch with Strap  - 1 x daily - 3 reps - 60 sec hold - Sidelying Hip Adduction  - 3 x weekly - 3 sets - 10 reps - Forward Step Down with Heel Tap and Counter Support  - 3 x weekly - 3 sets - 10 reps - Side Step Down with Counter Support  - 3 x weekly - 3 sets - 10 reps - Sit to Stand  - 3 x weekly - 3 sets - 10 reps - Single Leg Stance  - 1 x daily - 5 reps - 10 sec  hold  Patient Education - Scar Massage  ASSESSMENT:   CLINICAL IMPRESSION: Pt presents s/p 10 weeks right TKA. He continues to show improvements with right knee ROM and perception of right knee function. He is able to perform all functional activities with exception of his work related activities, but he has not attempted to reintroduce these activities at this point. He continues to show decreased right knee flexion ROM compared to left knee flexion ROM. PT to spend next session focusing on performing functional work related tasks to improve pt's confidence to return to work. 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   OBJECTIVE IMPAIRMENTS: Abnormal gait, decreased balance, decreased endurance, decreased mobility, difficulty walking, decreased ROM, decreased strength, increased edema, and pain.    ACTIVITY LIMITATIONS: bending, standing, squatting, stairs, bed mobility, dressing, and locomotion  level   PARTICIPATION LIMITATIONS: laundry, shopping, community activity, occupation, and yard work   PERSONAL FACTORS: Age, Profession, and 1 comorbidity: HTN   are also affecting patient's functional outcome.    REHAB POTENTIAL: Good   CLINICAL DECISION MAKING: Stable/uncomplicated   EVALUATION COMPLEXITY: Low     GOALS: Goals reviewed with patient? No   SHORT TERM GOALS: Target date: 12/09/2022   Pt will be independent with HEP in order to improve strength and balance in order to decrease fall risk and improve function at home and work. Baseline: Performing independently  Goal status: Achieved    2.  Patient will demonstrate understanding of scar tissue massage technique and perform independently.  Baseline: Awaiting scaps to heal  12/28/22: Demonstrated ability to perform scar massage independently  Goal status: Achieved      LONG TERM GOALS: Target date: 02/03/2023   Patient will have improved function and activity level as evidenced by an increase in FOTO score by 10 points or more.  Baseline: 31   12/28/22: 52  Goal status: Achieved     2.  Patient will improve right knee ROM flexion and extension to be nearly symmetrical to left knee AROM.   Baseline: Knee Flex R/L 60*/130, Knee Ext R/L 0*/15   12/28/22: Knee Flex R/L 105/135, Knee Ext R/L 0/4 01/07/23: Knee Ext R/L 4/0, Knee Flex R/L 104/140 02/01/23: Knee Flex  120/140  Goal status: Partially Met    3.  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  Baseline: Right knee extension unable to test, Left knee extension 5/5 12/28/22: Knee Ext R/L 4+/5  Goal status: Achieved    4.  Patient will return to walking without an assistive device as a sign of right knee healing.  Baseline: Ambulating with RW  12/28/22: Currently using [facility] 02/01/23: Not using single point cane  Goal status: Achieved       PLAN:   PT FREQUENCY:  1-2x/week   PT DURATION: 10 weeks   PLANNED INTERVENTIONS: Therapeutic exercises, Neuromuscular re-education, Balance training, Gait training, Patient/Family education, Self Care, Joint mobilization, Joint manipulation, DME instructions, Electrical stimulation, Manual therapy, and Re-evaluation, Dry needling, Cryotherapy, Aquatic therapy    PLAN FOR NEXT SESSION: Reassess goals for re-certification and continue to progress right knee mobility and strength exercises.   Bradly Chris PT, DPT

## 2023-02-04 ENCOUNTER — Ambulatory Visit: Payer: Medicare HMO | Admitting: Physical Therapy

## 2023-02-04 DIAGNOSIS — M25561 Pain in right knee: Secondary | ICD-10-CM | POA: Diagnosis not present

## 2023-02-04 DIAGNOSIS — Z96651 Presence of right artificial knee joint: Secondary | ICD-10-CM

## 2023-02-04 NOTE — Therapy (Addendum)
OUTPATIENT PHYSICAL THERAPY TREATMENT NOTE  Patient Name: Sergio [name] MRN: [id number] DOB:[birth year], 72 y.o., male Today's Date: 02/04/2023  PCP: Dr. Marlowe Sax  REFERRING PROVIDER: Dr. Charlies Constable   END OF SESSION:    02/04/23 0923  PT Visits / Re-Eval  Visit Number 16  Number of Visits 20  Date for PT Re-Evaluation 04/05/23  Authorization  Authorization Type [employer]  Authorization Time Period 11/25/22-02/03/23  Authorization - Visit Number 16  Authorization - Number of Visits 20  Progress Note Due on Visit 20  PT Time Calculation  PT Start Time 0930  PT Stop Time 1015  PT Time Calculation (min) 45 min  PT - End of Session  Equipment Utilized During Treatment Gait belt  Activity Tolerance Patient tolerated treatment well  Behavior During Therapy [facility] for tasks assessed/performed      Past Medical History:  Diagnosis Date   Arthritis    Enlarged prostate    Erectile dysfunction    History of colonoscopy    History of COVID-19    Hypertension    Low testosterone    Low testosterone    Pneumonia    2022   Past Surgical History:  Procedure Laterality Date   COLONOSCOPY     RETINAL DETACHMENT SURGERY Right    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   Procedure: TOTAL KNEE ARTHROPLASTY;  Surgeon: Willaim Sheng, MD;  Location: [facility];  Service: Orthopedics;  Laterality: Right;   WISDOM TOOTH EXTRACTION     Patient Active Problem List   Diagnosis Date Noted   Localized osteoarthritis of right knee 11/23/2022   Nonspecific abnormal electrocardiogram (ECG) (EKG) 10/06/2022   Pre-op evaluation 10/06/2022   Essential hypertension 07/09/2021   Erectile dysfunction 07/09/2021   Benign prostatic hyperplasia without lower urinary tract symptoms 07/09/2021   Secondary erythrocytosis 10/25/2018    REFERRING DIAG: S/p right total knee   THERAPY DIAG:  Acute pain of right knee  Hx of total knee arthroplasty, right  Rationale for  Evaluation and Treatment Rehabilitation  PERTINENT HISTORY: Pt started experiencing right knee pain in February at which point he went to [place] and received an injection. He continued to experience pain even after the injection. Pt then went for TKA.    PRECAUTIONS: None   SUBJECTIVE:                                                                                                                                                                                      SUBJECTIVE STATEMENT:     PAIN:  Are you having pain? No   OBJECTIVE: (objective measures completed at initial evaluation unless otherwise dated)  VITALS: BP 139/89 HR 80 SpO2 79    DIAGNOSTIC FINDINGS:    CLINICAL DATA:  Status post total right knee arthroplasty.   EXAM: PORTABLE RIGHT KNEE - 1-2 VIEW   COMPARISON:  None Available.   FINDINGS: Status post recent total right knee arthroplasty. No perihardware lucency is seen to indicate hardware failure or loosening. Expected postoperative intra-articular and subcutaneous air. Mild-to-moderate joint effusion. No acute fracture or dislocation.   IMPRESSION: Status post total right knee arthroplasty without evidence of hardware failure.     Electronically Signed   By: Yvonne Kendall M.D.   On: 11/23/2022 16:54     PATIENT SURVEYS:  FOTO 31/100 with target of 58   COGNITION: Overall cognitive status: Within functional limits for tasks assessed                         SENSATION: WFL   EDEMA:  Circumferential: R 17.5 inch and L 18 inch    MUSCLE LENGTH: Hamstrings: Right NT ; Left NT   Thomas test: Right NT deg; Left NT    POSTURE: No Significant postural limitations   PALPATION: Patella of right knee    LOWER EXTREMITY ROM:   AROM/PROM Right eval Left eval  Hip flexion      Hip extension      Hip abduction      Hip adduction      Hip internal rotation      Hip external rotation      Knee flexion 60/80* 130/130  Knee extension 15/15* 0/0   Ankle dorsiflexion      Ankle plantarflexion      Ankle inversion      Ankle eversion       (Blank rows = not tested)   LOWER EXTREMITY MMT:   MMT Right eval Left eval  Hip flexion 3 5  Hip extension      Hip abduction      Hip adduction      Hip internal rotation      Hip external rotation      Knee flexion 5 5  Knee extension NT  5  Ankle dorsiflexion      Ankle plantarflexion      Ankle inversion      Ankle eversion       (Blank rows = not tested)   LOWER EXTREMITY SPECIAL TESTS: None performed      FUNCTIONAL TESTS: None performed    GAIT: Distance walked: 50 ft  Assistive device utilized: Walker - 2 wheeled Level of assistance: Modified independence Comments: Decreased stance time on RLE and step length on LLE.     TODAY'S TREATMENT:                                                                                                                              DATE:   02/04/23: Matrix Recumbent Bicycle with resistance of 3 and seat  setting of 11 for 5 min Standing Forward Lunges on RLE 1 x 10  Walking Forward Lunges 1 x 10  Omega Knee Extension on RLE #10 1 x 5  -Pt unable to achieve terminal knee extension  Omega Knee Extension on RLE #5 3 x 15 Omega HS curl on RLE #10 1 x 10  Omega HS curl on RLE #15 2 x 10    02/01/23: Matrix Recumbent Bicycle with resistance of 4 and seat setting of 11 for 6 min  FOTO: 6/100 Knee ROM:  -AROM Flex R/L 120/140 -PROM Flex R/L  120/140 AROM Ext R/L 0/0   Full kneel position 2 x 60 sec  -min VC to maintain upright posture for increased knee flexion  Reviewed HEP with progressions   01/27/23: Matrix Recumbent Bicycle with resistance of 4 and seat setting of 13 for 5 min  RLE Single leg balance 5 x 10 sec  Forward step down off 6 inch step on RLE with 1 UE support  -Pt unable to control lowering due to R knee weakness  Forward step down off 4 inch step on RLE with 1 UE support  -Pt is still unable to perform due to R  knee weakness  Forward step down off 6 inch step on RLE with BUE support  -mod VC to decrease speed of exercise  Side Step down on 6 inch step on RLE with BUE support using stair rail  -mod VC to decrease speed of eccentric phase  Side Step down on 4 inch step on RLE with BUE support using mat  Sit to stand from 18 inch mat height 1 x 10  -min VC to inhale when standing and exhale when sitting  -min VC to increase speed of stand and decrease speed of sitting   01/18/23:  Matrix Recumbent Bicycle with resistance of 2 and seat setting of 13 for 5 min  Sit to Stand from 18 inch mat height 1 x 10  -Pt reports increased pain in right knee  Sit to Stand from 22 inch mat height with #8 lb 1 x 10  Side Step Down  on 4 inch step with BUE support 2 x 10  -no increased pain in right knee  Side Step Down on 4 inch step with 1 UE support 2 x 10  -Performed on RLE and LLE only using 1 UE support  Forward Step Downs with BUE support with railings 2 x 10  - From 6 inch step for BUE support   Reviewed patella mobilizations with patient using video: [email or web address].[email or web address] -Four way patella mobilizations    01/07/23:  Matrix recumbent bicycle set to 13 with no resistance for 5 min  -Pt displays significant pain  Knee Ext RLE MMT 5/5  Knee Ext AROM R/L 4/0 Knee Ext PROM R/L 3/-1 Knee Flex AROM R/L 105/140 Knee Flex PROM R/L 107/140  Prone Quad Stretch 3 x 60 sec  Short Arc Quad using [employer]- started 60 degrees flexion -AROM to 30  -PROM to 35  -lacking 5 degrees extension  Step up on 4 inch step on RLE 1 x 10  Step up on 8 inch step on RLE with 1 UE support 1 x 10  Step up on 6 inch step on RLE with 1 UE support  3 x 10   Gave pt TKA rehab protocol and explained that we are in 7-12 week range with goals and exercises.    12/21/22  Nu-Step level 9 for arms and  feet with level 1 resistance for 5 min  Knee Ext R/L 15 deg/ 0 deg   Left Short Arc Quad PROM>AROM   NMES with  performance of SAQ x 20 Wavefore: [place] Mode: Single   Intensity: 85 mA CC   Duration: 10 min  Cycle Time: 10/50   Ramp: 2 sec     PATIENT EDUCATION:  Education details: form and technique for appropriate exercise and explanation about modalities for decreased swelling and pain relief  Person educated: Patient and wife  Education method: Consulting civil engineer, Media planner, Verbal cues, and Handouts Education comprehension: verbalized understanding, returned demonstration, and verbal cues required   HOME EXERCISE PROGRAM: Access Code: [email or web address] URL: [email or web address] Date: 02/04/2023 Prepared by: Bradly Chris  Exercises - Standing Knee Flexion Stretch on Step  - 1 x daily - 3 sets - 15 reps - Long Sitting Calf Stretch with Strap  - 1 x daily - 3 reps - 30-60 sec hold - Prone Quadriceps Stretch with Strap  - 1 x daily - 3 reps - 60 sec hold - Walking Forward Lunge  - 1 x daily - 10 reps - Forward Step Down with Heel Tap and Counter Support  - 3 x weekly - 3 sets - 10 reps - Side Step Down with Counter Support  - 3 x weekly - 3 sets - 10 reps - Single Leg Stance  - 1 x daily - 5 reps - 10 sec  hold  Patient Education - Scar Massage  ASSESSMENT:   CLINICAL IMPRESSION: Pt presents s/p 11 weeks right TKA. He continues to demonstrate improvements in right quad and hamstring strength with ability to perform exercises with increased resistance on OMEGA machines. PT discussed protein requirements for older adults to build muscles, 80 grams and pt's daily diet and potential to add more protein. Pt receptive to these suggestions and he will attempt to add a high protein snack to his diet. 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    OBJECTIVE IMPAIRMENTS: Abnormal gait, decreased balance, decreased endurance, decreased mobility, difficulty walking,  decreased ROM, decreased strength, increased edema, and pain.    ACTIVITY LIMITATIONS: bending, standing, squatting, stairs, bed mobility, dressing, and locomotion level   PARTICIPATION LIMITATIONS: laundry, shopping, community activity, occupation, and yard work   PERSONAL FACTORS: Age, Profession, and 1 comorbidity: HTN   are also affecting patient's functional outcome.    REHAB POTENTIAL: Good   CLINICAL DECISION MAKING: Stable/uncomplicated   EVALUATION COMPLEXITY: Low     GOALS: Goals reviewed with patient? No   SHORT TERM GOALS: Target date: 12/09/2022   Pt will be independent with HEP in order to improve strength and balance in order to decrease fall risk and improve function at home and work. Baseline: Performing independently  Goal status: Achieved    2.  Patient will demonstrate understanding of scar tissue massage technique and perform independently.  Baseline: Awaiting scaps to heal  12/28/22: Demonstrated ability to perform scar massage independently  Goal status: Achieved      LONG TERM GOALS: Target date: 02/03/2023   Patient will have improved function and activity level as evidenced by an increase in FOTO score by 10 points or more.  Baseline: 31   12/28/22: 52  Goal status: Achieved     2.  Patient will improve right knee ROM flexion and extension to be nearly symmetrical to left knee AROM.  Baseline: Knee Flex R/L 60*/130, Knee Ext R/L 0*/15   12/28/22: Knee Flex R/L 105/135, Knee Ext R/L 0/4 01/07/23: Knee Ext R/L 4/0, Knee Flex R/L 104/140 02/01/23: Knee Flex 120/140  Goal status: Partially Met    3.  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  Baseline: Right knee extension unable to test, Left knee extension 5/5 12/28/22: Knee Ext R/L 4+/5  Goal status: Achieved    4.  Patient will return to walking without an assistive device as a sign of right knee  healing.  Baseline: Ambulating with RW  12/28/22: Currently using [facility] 02/01/23: Not using single point cane  Goal status: Achieved       PLAN:   PT FREQUENCY: 1-2x/week   PT DURATION: 10 weeks   PLANNED INTERVENTIONS: Therapeutic exercises, Neuromuscular re-education, Balance training, Gait training, Patient/Family education, Self Care, Joint mobilization, Joint manipulation, DME instructions, Electrical stimulation, Manual therapy, and Re-evaluation, Dry needling, Cryotherapy, Aquatic therapy    PLAN FOR NEXT SESSION: Complete functional tasks related to his job and continue to strengthen muscles around knee and perform knee mobility exercises   Bradly Chris PT, DPT

## 2023-02-05 ENCOUNTER — Ambulatory Visit (INDEPENDENT_AMBULATORY_CARE_PROVIDER_SITE_OTHER): Payer: Medicare HMO | Admitting: Family

## 2023-02-05 ENCOUNTER — Encounter: Payer: Self-pay | Admitting: Family

## 2023-02-05 VITALS — BP 132/80 | HR 82 | Temp 97.8°F | Resp 17 | Ht 68.0 in | Wt 177.6 lb

## 2023-02-05 DIAGNOSIS — E785 Hyperlipidemia, unspecified: Secondary | ICD-10-CM

## 2023-02-05 DIAGNOSIS — N529 Male erectile dysfunction, unspecified: Secondary | ICD-10-CM

## 2023-02-05 DIAGNOSIS — I10 Essential (primary) hypertension: Secondary | ICD-10-CM | POA: Diagnosis not present

## 2023-02-05 DIAGNOSIS — N400 Enlarged prostate without lower urinary tract symptoms: Secondary | ICD-10-CM

## 2023-02-05 DIAGNOSIS — Z23 Encounter for immunization: Secondary | ICD-10-CM | POA: Diagnosis not present

## 2023-02-05 NOTE — Progress Notes (Unsigned)
Provider: Marlowe Sax FNP-C   Keitha Kolk, Nelda Bucks, NP  Patient Care Team: Alysa Duca, Nelda Bucks, NP as PCP - General (Family Medicine) Nicholas Lose, MD as Consulting Physician (Hematology and Oncology)  Extended Emergency Contact Information Primary Emergency Contact: [place] Phone: [phone] Mobile Phone: [phone] Relation: Spouse Secondary Emergency Contact: [place] Mobile Phone: [phone] Relation: Daughter  Code Status:  Full Code  Goals of care: Advanced Directive information    02/05/2023    8:55 AM  Advanced Directives  Does Patient Have a Medical Advance Directive? Yes  Type of Paramedic of [place];Living will  Does patient want to make changes to medical advance directive? No - Guardian declined     Chief Complaint  Patient presents with   Medical Management of Chronic Issues    6 month follow up.   Immunizations    Discussed the need for Tdap, shingles, pneumonia and d updated covid.[facility] verified    Quality Metric Gaps    Discussed the need for AWV    HPI:  Pt is a 72 y.o. male seen today for 6 months follow up for medical management of chronic diseases.   Had right knee replacement in December,2023.states doing well.tends to be stiff in the morning.continues with Physical therapy at least twice per week.Has not required any analgesics.Has up coming appointment with Orthopedic.  Has been exercising by walking. No home blood pressure readings for review.He denies any headache,dizziness,vision changes,fatigue,chest tightness,palpitation,chest pain or shortness of breath.      He was seen by Urologist 02/04/2023 for BPH states PSA was checked. Advised to continue on Rapaflo.Also continued on Sildenafil.  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  Past Medical History:  Diagnosis Date   Arthritis    Enlarged prostate    Erectile dysfunction     History of colonoscopy    History of COVID-19    Hypertension    Low testosterone    Low testosterone    Pneumonia    2022   Past Surgical History:  Procedure Laterality Date   COLONOSCOPY     RETINAL DETACHMENT SURGERY Right    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   Procedure: TOTAL KNEE ARTHROPLASTY;  Surgeon: Willaim Sheng, MD;  Location: [facility];  Service: Orthopedics;  Laterality: Right;   WISDOM TOOTH EXTRACTION      No Known Allergies  Allergies as of 02/05/2023   No Known Allergies      Medication List        Accurate as of February 05, 2023  9:01 AM. If you have any questions, ask your nurse or doctor.          ascorbic acid 500 MG tablet Commonly known as: VITAMIN C Take 500 mg by mouth daily.   aspirin EC 81 MG tablet Take 81 mg by mouth daily.   hydrochlorothiazide 25 MG tablet Commonly known as: HYDRODIURIL TAKE 1 TABLET (25 MG TOTAL) BY MOUTH DAILY.   lisinopril 20 MG tablet Commonly known as: ZESTRIL TAKE 1 TABLET BY MOUTH EVERY DAY   multivitamin tablet Take 1 tablet by mouth daily.   sildenafil 20 MG tablet Commonly known as: REVATIO Take 1 tablet (20 mg total) by mouth as needed.   silodosin 8 MG Caps capsule Commonly known as: RAPAFLO Take 1 capsule (8 mg total) by mouth daily with breakfast.   Testosterone Cypionate 100 MG/ML Soln 10% GEL. 4 clicks daily.  Review of Systems  Constitutional:  Negative for appetite change, chills, fatigue, fever and unexpected weight change.  HENT:  Negative for congestion, dental problem, ear discharge, ear pain, facial swelling, hearing loss, nosebleeds, postnasal drip, rhinorrhea, sinus pressure, sinus pain, sneezing, sore throat, tinnitus and trouble swallowing.   Eyes:  Negative for pain, discharge, redness, itching and visual disturbance.  Respiratory:  Negative for cough, chest tightness, shortness of breath and wheezing.   Cardiovascular:  Negative for chest pain,  palpitations and leg swelling.  Gastrointestinal:  Negative for abdominal distention, abdominal pain, blood in stool, constipation, diarrhea, nausea and vomiting.  Endocrine: Negative for cold intolerance, heat intolerance, polydipsia, polyphagia and polyuria.  Genitourinary:  Negative for difficulty urinating, dysuria, flank pain, frequency and urgency.  Musculoskeletal:  Negative for arthralgias, back pain, gait problem, joint swelling, myalgias, neck pain and neck stiffness.  Skin:  Negative for color change, pallor, rash and wound.  Neurological:  Negative for dizziness, syncope, speech difficulty, weakness, light-headedness, numbness and headaches.  Hematological:  Does not bruise/bleed easily.  Psychiatric/Behavioral:  Negative for agitation, behavioral problems, confusion, hallucinations, self-injury, sleep disturbance and suicidal ideas. The patient is not nervous/anxious.     Immunization History  Administered Date(s) Administered   Fluad Quad(high Dose 65+) 01/23/2022   Moderna Sars-Covid-2 Vaccination 12/11/2019, 01/08/2020   Pertinent  Health Maintenance Due  Topic Date Due   INFLUENZA VACCINE  03/07/2023 (Originally 07/07/2022)   COLONOSCOPY (Pts 45-3yr Insurance coverage will need to be confirmed)  10/18/2030      11/23/2022    6:00 PM 11/23/2022    8:00 PM 11/24/2022    8:00 AM 01/25/2023    8:50 AM 02/05/2023    8:55 AM  Fall Risk  Falls in the past year?    0 0  Was there an injury with Fall?    0 0  Fall Risk Category Calculator    0 0  (RETIRED) Patient Fall Risk Level High fall risk High fall risk High fall risk    Patient at Risk for Falls Due to    No Fall Risks No Fall Risks  Fall risk Follow up    Falls evaluation completed Falls evaluation completed   Functional Status Survey:    Vitals:   02/05/23 0850  BP: 132/80  Pulse: 82  Resp: 17  Temp: 97.8 F (36.6 C)  TempSrc: Temporal  SpO2: 97%  Weight: 177 lb 9.6 oz (80.6 kg)  Height: '5\' 8"'$  (1.727 m)    Body mass index is 27 kg/m. Physical Exam Vitals reviewed.  Constitutional:      General: He is not in acute distress.    Appearance: Normal appearance. He is overweight. He is not ill-appearing or diaphoretic.  HENT:     Head: Normocephalic.     Right Ear: There is impacted cerumen.     Left Ear: There is impacted cerumen.     Ears:     Comments: Bilateral ear cerumen lavaged with warm water and hydrogen peroxide moderate amounts of cerumen obtained.curette used.Tolerated procedure well.TM clear without any signs of infection.     Nose: Nose normal. No congestion or rhinorrhea.     Mouth/Throat:     Mouth: Mucous membranes are moist.     Pharynx: Oropharynx is clear. No oropharyngeal exudate or posterior oropharyngeal erythema.  Eyes:     General: No scleral icterus.       Right eye: No discharge.        Left eye: No discharge.  Extraocular Movements: Extraocular movements intact.     Conjunctiva/sclera: Conjunctivae normal.     Pupils: Pupils are equal, round, and reactive to light.  Neck:     Vascular: No carotid bruit.  Cardiovascular:     Rate and Rhythm: Normal rate and regular rhythm.     Pulses: Normal pulses.     Heart sounds: Normal heart sounds. No murmur heard.    No friction rub. No gallop.  Pulmonary:     Effort: Pulmonary effort is normal. No respiratory distress.     Breath sounds: Normal breath sounds. No wheezing, rhonchi or rales.  Chest:     Chest wall: No tenderness.  Abdominal:     General: Bowel sounds are normal. There is no distension.     Palpations: Abdomen is soft. There is no mass.     Tenderness: There is no abdominal tenderness. There is no right CVA tenderness, left CVA tenderness, guarding or rebound.  Musculoskeletal:        General: No swelling or tenderness. Normal range of motion.     Cervical back: Normal range of motion. No rigidity or tenderness.     Right lower leg: No edema.     Left lower leg: No edema.  Lymphadenopathy:      Cervical: No cervical adenopathy.  Skin:    General: Skin is warm and dry.     Coloration: Skin is not pale.     Findings: No bruising, erythema, lesion or rash.     Comments: Right knee surgical incision completely healed.   Neurological:     Mental Status: He is alert and oriented to person, place, and time.     Cranial Nerves: No cranial nerve deficit.     Sensory: No sensory deficit.     Motor: No weakness.     Coordination: Coordination normal.     Gait: Gait normal.  Psychiatric:        Mood and Affect: Mood normal.        Speech: Speech normal.        Behavior: Behavior normal.        Thought Content: Thought content normal.        Judgment: Judgment normal.     Labs reviewed: Recent Labs    07/31/22 0917 11/17/22 1000 11/24/22 0410  NA 134* 131* 132*  K 4.2 3.8 3.8  CL 100 99 102  CO2 '24 24 23  '$ GLUCOSE 96 92 162*  BUN '10 12 15  '$ CREATININE 1.16 0.83 1.02  CALCIUM 9.1 9.3 8.7*   Recent Labs    07/31/22 0917  AST 23  ALT 24  BILITOT 1.3*  PROT 6.7   Recent Labs    07/31/22 0917 11/02/22 0819 11/24/22 0410  WBC 4.7 5.6 10.2  NEUTROABS 2,834 3.4  --   HGB 15.2 15.3 13.1  HCT 50.1* 48.7 42.0  MCV 71.4* 69.9* 70.2*  PLT 188 218 198   Lab Results  Component Value Date   TSH 1.41 07/31/2022   No results found for: "HGBA1C" Lab Results  Component Value Date   CHOL 152 07/31/2022   HDL 42 07/31/2022   LDLCALC 91 07/31/2022   TRIG 91 07/31/2022   CHOLHDL 3.6 07/31/2022    Significant Diagnostic Results in last 30 days:  No results found.  Assessment/Plan There are no diagnoses linked to this encounter.   Family/ staff Communication: Reviewed plan of care with patient verbalized understanding.  Labs/tests ordered:  - CBC with Differential/Platelet -  CMP with eGFR([facility]) - TSH - Lipid panel  Next Appointment : Return in about 6 months (around 08/08/2023) for medical mangement of chronic issues.Sandrea Hughs, NP

## 2023-02-05 NOTE — Patient Instructions (Addendum)
-   Please get your Tetanus and shingles vaccine at the pharmacy

## 2023-02-06 LAB — CBC WITH DIFFERENTIAL/PLATELET
Absolute Monocytes: 420 cells/uL (ref 200–950)
Basophils Absolute: 22 cells/uL (ref 0–200)
Basophils Relative: 0.4 %
Eosinophils Absolute: 39 cells/uL (ref 15–500)
Eosinophils Relative: 0.7 %
HCT: 50.5 % — ABNORMAL HIGH (ref 38.5–50.0)
Hemoglobin: 15.5 g/dL (ref 13.2–17.1)
Lymphs Abs: 1490 cells/uL (ref 850–3900)
MCH: 22.8 pg — ABNORMAL LOW (ref 27.0–33.0)
MCHC: 30.7 g/dL — ABNORMAL LOW (ref 32.0–36.0)
MCV: 74.2 fL — ABNORMAL LOW (ref 80.0–100.0)
Monocytes Relative: 7.5 %
Neutro Abs: 3629 cells/uL (ref 1500–7800)
Neutrophils Relative %: 64.8 %
Platelets: 210 10*3/uL (ref 140–400)
RBC: 6.81 10*6/uL — ABNORMAL HIGH (ref 4.20–5.80)
RDW: 19.6 % — ABNORMAL HIGH (ref 11.0–15.0)
Total Lymphocyte: 26.6 %
WBC: 5.6 10*3/uL (ref 3.8–10.8)

## 2023-02-06 LAB — LIPID PANEL
Cholesterol: 156 mg/dL (ref <200)
HDL: 43 mg/dL (ref >=40)
LDL Cholesterol (Calc): 91 mg/dL (calc)
Non-HDL Cholesterol (Calc): 113 mg/dL (calc) (ref <130)
Total CHOL/HDL Ratio: 3.6 (calc) (ref <5.0)
Triglycerides: 127 mg/dL (ref <150)

## 2023-02-06 LAB — COMPLETE METABOLIC PANEL WITH GFR
AG Ratio: 1.7 (calc) (ref 1.0–2.5)
ALT: 18 U/L (ref 9–46)
AST: 21 U/L (ref 10–35)
Albumin: 4.3 g/dL (ref 3.6–5.1)
Alkaline phosphatase (APISO): 47 U/L (ref 35–144)
BUN: 11 mg/dL (ref 7–25)
CO2: 28 mmol/L (ref 20–32)
Calcium: 9.8 mg/dL (ref 8.6–10.3)
Chloride: 96 mmol/L — ABNORMAL LOW (ref 98–110)
Creat: 0.88 mg/dL (ref 0.70–1.28)
Globulin: 2.6 g/dL (calc) (ref 1.9–3.7)
Glucose, Bld: 83 mg/dL (ref 65–99)
Potassium: 3.8 mmol/L (ref 3.5–5.3)
Sodium: 133 mmol/L — ABNORMAL LOW (ref 135–146)
Total Bilirubin: 1.1 mg/dL (ref 0.2–1.2)
Total Protein: 6.9 g/dL (ref 6.1–8.1)
eGFR: 92 mL/min/{1.73_m2} (ref >=60)

## 2023-02-06 LAB — TSH: TSH: 1.18 mIU/L (ref 0.40–4.50)

## 2023-02-10 ENCOUNTER — Ambulatory Visit: Payer: Medicare HMO | Attending: Orthopedic Surgery | Admitting: Physical Therapy

## 2023-02-10 DIAGNOSIS — M25561 Pain in right knee: Secondary | ICD-10-CM | POA: Insufficient documentation

## 2023-02-10 DIAGNOSIS — Z96651 Presence of right artificial knee joint: Secondary | ICD-10-CM | POA: Insufficient documentation

## 2023-02-10 NOTE — Therapy (Signed)
OUTPATIENT PHYSICAL THERAPY TREATMENT NOTE  Patient Name: Sergio [name] MRN: [id number] DOB:18-Oct-1951, 72 y.o., male Today's Date: 02/10/2023  PCP: Dr. Marlowe Sax  REFERRING PROVIDER: Dr. Charlies Constable   END OF SESSION:   PT End of Session - 02/10/23 0929     Visit Number 17    Number of Visits 20    Date for PT Re-Evaluation 04/05/23    Authorization Type [employer]    Authorization Time Period 02/04/23-04/05/23    Authorization - Visit Number 17    Authorization - Number of Visits 20    Progress Note Due on Visit 20    PT Start Time 0930    PT Stop Time 1015    PT Time Calculation (min) 45 min    Equipment Utilized During Treatment Gait belt    Activity Tolerance Patient tolerated treatment well    Behavior During Therapy WFL for tasks assessed/performed                   Past Medical History:  Diagnosis Date   Arthritis    Enlarged prostate    Erectile dysfunction    History of colonoscopy    History of COVID-19    Hypertension    Low testosterone    Low testosterone    Pneumonia    2022   Past Surgical History:  Procedure Laterality Date   COLONOSCOPY     RETINAL DETACHMENT SURGERY Right    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   Procedure: TOTAL KNEE ARTHROPLASTY;  Surgeon: Willaim Sheng, MD;  Location: [facility];  Service: Orthopedics;  Laterality: Right;   WISDOM TOOTH EXTRACTION     Patient Active Problem List   Diagnosis Date Noted   Localized osteoarthritis of right knee 11/23/2022   Nonspecific abnormal electrocardiogram (ECG) (EKG) 10/06/2022   Pre-op evaluation 10/06/2022   Essential hypertension 07/09/2021   Erectile dysfunction 07/09/2021   Benign prostatic hyperplasia without lower urinary tract symptoms 07/09/2021   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 He continued to experience pain even after the injection. Pt then went for TKA.    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    PAIN:  Are you having pain? Yes: NPRS scale: 2/10 Pain location: Right knee pain  Pain description: Achy  Aggravating factors: Increased weight bearing  Relieving factors: Sitting still    OBJECTIVE: (objective measures completed at initial evaluation unless otherwise dated)   VITALS: BP 139/89 HR 80 SpO2 79    DIAGNOSTIC FINDINGS:    CLINICAL DATA:  Status post total right knee arthroplasty.   EXAM: PORTABLE RIGHT KNEE - 1-2 VIEW   COMPARISON:  None Available.   FINDINGS: Status post recent total right knee arthroplasty. No perihardware lucency is seen to indicate hardware failure or loosening. Expected postoperative intra-articular and subcutaneous air. Mild-to-moderate joint effusion. No acute fracture or dislocation.   IMPRESSION: Status post total right knee arthroplasty without evidence of hardware failure.     Electronically Signed   By: Yvonne Kendall M.D.   On: 11/23/2022 16:54     PATIENT SURVEYS:  FOTO 31/100 with target of 58   COGNITION: Overall cognitive status: Within functional limits for tasks assessed                         SENSATION: WFL   EDEMA:  Circumferential: R 17.5 inch and L 18 inch    MUSCLE LENGTH: Hamstrings: Right NT ; Left NT   Thomas test: Right NT deg; Left NT    POSTURE: No Significant postural limitations   PALPATION: Patella of right knee    LOWER  EXTREMITY ROM:   AROM/PROM Right eval Left eval  Hip flexion      Hip extension      Hip abduction      Hip adduction      Hip internal rotation      Hip external rotation      Knee flexion 60/80* 130/130  Knee extension 15/15* 0/0  Ankle dorsiflexion      Ankle plantarflexion      Ankle inversion      Ankle eversion       (Blank rows = not tested)   LOWER EXTREMITY MMT:   MMT Right eval Left eval  Hip flexion 3 5  Hip extension      Hip abduction      Hip adduction      Hip internal rotation      Hip external rotation      Knee flexion 5 5  Knee extension NT  5  Ankle dorsiflexion      Ankle plantarflexion      Ankle inversion      Ankle eversion       (Blank rows = not tested)   LOWER EXTREMITY SPECIAL TESTS: None performed      FUNCTIONAL TESTS: None performed    GAIT: Distance walked: 50 ft  Assistive device utilized: Walker - 2 wheeled Level of assistance: Modified independence Comments: Decreased stance time on RLE and step length on LLE.     TODAY'S TREATMENT:  DATE:   02/10/23: Matrix Recumbent Bicycle with resistance of 2 and seat setting of 12 for 5 min 10 meter x 2 ambulation  -No noticeable deficits in gait  Knee Flex AROM R/L 122/140    Full knee quad stretch 3 x 30 sec  -Pads placed under knees  Squat with #20 KB 2 x 10 -mod VC to maintain upright posture   02/04/23: Matrix Recumbent Bicycle with resistance of 3 and seat setting of 11 for 5 min Standing Forward Lunges on RLE 1 x 10  Walking Forward Lunges 1 x 10  Omega Knee Extension on RLE #10 1 x 5  -Pt unable to achieve terminal knee extension  Omega Knee Extension on RLE #5 3 x 15 Omega HS curl on RLE #10 1 x 10  Omega HS curl on RLE #15 2 x 10    02/01/23: Matrix Recumbent Bicycle with resistance of 4 and seat setting of 11 for 6 min  FOTO: 6/100 Knee  ROM:  -AROM Flex R/L 120/140 -PROM Flex R/L  120/140 AROM Ext R/L 0/0   Full kneel position 2 x 60 sec  -min VC to maintain upright posture for increased knee flexion  Reviewed HEP with progressions   01/27/23: Matrix Recumbent Bicycle with resistance of 4 and seat setting of 13 for 5 min  RLE Single leg balance 5 x 10 sec  Forward step down off 6 inch step on RLE with 1 UE support  -Pt unable to control lowering due to R knee weakness  Forward step down off 4 inch step on RLE with 1 UE support  -Pt is still unable to perform due to R knee weakness  Forward step down off 6 inch step on RLE with BUE support  -mod VC to decrease speed of exercise  Side Step down on 6 inch step on RLE with BUE support using stair rail  -mod VC to decrease speed of eccentric phase  Side Step down on 4 inch step on RLE with BUE support using mat  Sit to stand from 18 inch mat height 1 x 10  -min VC to inhale when standing and exhale when sitting  -min VC to increase speed of stand and decrease speed of sitting       PATIENT EDUCATION:  Education details: form and technique for appropriate exercise and explanation about modalities for decreased swelling and pain relief  Person educated: Patient and wife  Education method: Consulting civil engineer, Media planner, Verbal cues, and Handouts Education comprehension: verbalized understanding, returned demonstration, and verbal cues required   HOME EXERCISE PROGRAM: Access Code: [email or web address] URL: [email or web address] Date: 02/10/2023 Prepared by: Bradly Chris  Exercises - Standing Knee Flexion Stretch on Step  - 1 x daily - 3 sets - 15 reps - Long Sitting Calf Stretch with Strap  - 1 x daily - 3 reps - 30-60 sec hold - Prone Quadriceps Stretch with Strap  - 1 x daily - 3 reps - 60 sec hold - Walking Forward Lunge  - 1 x daily - 10 reps - Forward Step Down with Heel Tap and Counter Support  - 3 x weekly - 3 sets - 10 reps - Side Step Down with  Counter Support  - 3 x weekly - 3 sets - 10 reps - Mini Squat  - 3 x weekly - 3 sets - 10 reps Full Kneel 3 x 60 sec x 7 days per week   Patient Education - Scar Massage  ASSESSMENT:   CLINICAL IMPRESSION:  Pt presents s/p 12 weeks right TKA. He shows improvement in right knee ROM with increased flexion. He continues to struggle with functional tasks such as weight squats which are important activities for him to perform in his job such as lifting a person up from a stretch. Session shortened due to tornado drill. 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   OBJECTIVE IMPAIRMENTS: Abnormal gait, decreased balance, decreased endurance, decreased mobility, difficulty walking, decreased ROM, decreased strength, increased edema, and pain.    ACTIVITY LIMITATIONS: bending, standing, squatting, stairs, bed mobility, dressing, and locomotion level   PARTICIPATION LIMITATIONS: laundry, shopping, community activity, occupation, and yard work   PERSONAL FACTORS: Age, Profession, and 1 comorbidity: HTN   are also affecting patient's functional outcome.    REHAB POTENTIAL: Good   CLINICAL DECISION MAKING: Stable/uncomplicated   EVALUATION COMPLEXITY: Low     GOALS: Goals reviewed with patient? No   SHORT TERM GOALS: Target date: 12/09/2022   Pt will be independent with HEP in order to improve strength and balance in order to decrease fall risk and improve function at home and work. Baseline: Performing independently  Goal status: Achieved    2.  Patient will demonstrate understanding of scar tissue massage technique and perform independently.  Baseline: Awaiting scaps to heal  12/28/22: Demonstrated ability to perform scar massage independently  Goal status: Achieved      LONG TERM GOALS: Target date: 02/03/2023   Patient will have improved function and activity level as  evidenced by an increase in FOTO score by 10 points or more.  Baseline: 31   12/28/22: 52  Goal status: Achieved     2.  Patient will improve right knee ROM flexion and extension to be nearly symmetrical to left knee AROM.   Baseline: Knee Flex R/L 60*/130, Knee Ext R/L 0*/15   12/28/22: Knee Flex R/L 105/135, Knee Ext R/L 0/4 01/07/23: Knee Ext R/L 4/0, Knee Flex R/L 104/140 02/01/23: Knee Flex 120/140 02/10/23: Knee Flex R/L 122/140 Goal status: Partially Met    3.  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  Baseline: Right knee extension unable to test, Left knee extension 5/5 12/28/22: Knee Ext R/L 4+/5  Goal status: Achieved    4.  Patient will return to walking without an assistive device as a sign of right knee healing.  Baseline: Ambulating with RW  12/28/22: Currently using [facility] 02/01/23: Not using single point cane  Goal status: Achieved       PLAN:   PT FREQUENCY: 1-2x/week   PT DURATION: 10 weeks   PLANNED INTERVENTIONS: Therapeutic exercises, Neuromuscular re-education, Balance training, Gait training, Patient/Family education, Self Care, Joint mobilization, Joint manipulation, DME instructions, Electrical stimulation, Manual therapy, and Re-evaluation, Dry needling, Cryotherapy, Aquatic therapy    PLAN FOR NEXT SESSION: Omega hamstring curls and knee extension. Complete functional tasks related to his job and continue to strengthen muscles around knee and perform knee mobility exercises   Bradly Chris PT, DPT

## 2023-02-16 ENCOUNTER — Ambulatory Visit: Payer: Medicare HMO | Admitting: Physical Therapy

## 2023-02-16 ENCOUNTER — Encounter: Payer: Self-pay | Admitting: Physical Therapy

## 2023-02-16 DIAGNOSIS — M25561 Pain in right knee: Secondary | ICD-10-CM | POA: Diagnosis not present

## 2023-02-16 DIAGNOSIS — Z96651 Presence of right artificial knee joint: Secondary | ICD-10-CM

## 2023-02-16 NOTE — Therapy (Signed)
OUTPATIENT PHYSICAL THERAPY DISCHARGE NOTE  Patient Name: Sergio [name] MRN: [id number] DOB:01-Aug-1951, 72 y.o., male Today's Date: 02/16/2023  PCP: Dr. Marlowe Sax  REFERRING PROVIDER: Dr. Charlies Constable   END OF SESSION:   PT End of Session - 02/16/23 1425     Visit Number 18    Number of Visits 20    Date for PT Re-Evaluation 04/05/23    Authorization Type [employer]    Authorization Time Period 02/04/23-04/05/23    Authorization - Visit Number 18    Authorization - Number of Visits 20    Progress Note Due on Visit 20    PT Start Time [id number]    PT Stop Time 1500    PT Time Calculation (min) 40 min    Equipment Utilized During Treatment Gait belt    Activity Tolerance Patient tolerated treatment well    Behavior During Therapy WFL for tasks assessed/performed                   Past Medical History:  Diagnosis Date   Arthritis    Enlarged prostate    Erectile dysfunction    History of colonoscopy    History of COVID-19    Hypertension    Low testosterone    Low testosterone    Pneumonia    2022   Past Surgical History:  Procedure Laterality Date   COLONOSCOPY     RETINAL DETACHMENT SURGERY Right    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   Procedure: TOTAL KNEE ARTHROPLASTY;  Surgeon: Willaim Sheng, MD;  Location: [facility];  Service: Orthopedics;  Laterality: Right;   WISDOM TOOTH EXTRACTION     Patient Active Problem List   Diagnosis Date Noted   Localized osteoarthritis of right knee 11/23/2022   Nonspecific abnormal electrocardiogram (ECG) (EKG) 10/06/2022   Pre-op evaluation 10/06/2022   Essential hypertension 07/09/2021   Erectile dysfunction 07/09/2021   Benign prostatic hyperplasia without lower urinary tract symptoms 07/09/2021   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 He continued to experience pain even after the injection. Pt then went for TKA.    PRECAUTIONS: None   SUBJECTIVE:  SUBJECTIVE STATEMENT:  Pt states that he has been released by surgeon who believes he has reached knee ROM of goals. He is having him report back in a year. 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   PAIN:  Are you having pain? Yes: NPRS scale: 2/10 Pain location: Right knee pain  Pain description: Achy  Aggravating factors: Increased weight bearing  Relieving factors: Sitting still    OBJECTIVE: (objective measures completed at initial evaluation unless otherwise dated)   VITALS: BP 139/89 HR 80 SpO2 79    DIAGNOSTIC FINDINGS:    CLINICAL DATA:  Status post total right knee arthroplasty.   EXAM: PORTABLE RIGHT KNEE - 1-2 VIEW   COMPARISON:  None Available.   FINDINGS: Status post recent total right knee arthroplasty. No perihardware lucency is seen to indicate hardware failure or loosening. Expected postoperative intra-articular and subcutaneous air. Mild-to-moderate joint effusion. No acute fracture or dislocation.   IMPRESSION: Status post total right knee arthroplasty without evidence of hardware failure.     Electronically Signed   By: Yvonne Kendall M.D.   On: 11/23/2022 16:54     PATIENT SURVEYS:  FOTO 31/100 with target of 58   COGNITION: Overall cognitive status: Within functional limits for tasks assessed                         SENSATION: WFL   EDEMA:  Circumferential: R 17.5 inch and L 18 inch    MUSCLE LENGTH: Hamstrings: Right NT ; Left NT   Thomas test: Right NT deg; Left  NT    POSTURE: No Significant postural limitations   PALPATION: Patella of right knee    LOWER EXTREMITY ROM:   AROM/PROM Right eval Left eval  Hip flexion      Hip extension      Hip abduction      Hip adduction      Hip internal rotation      Hip external rotation      Knee flexion 60/80* 130/130  Knee extension 15/15* 0/0  Ankle dorsiflexion      Ankle plantarflexion      Ankle inversion      Ankle eversion       (Blank rows = not tested)   LOWER EXTREMITY MMT:   MMT Right eval Left eval  Hip flexion 3 5  Hip extension      Hip abduction      Hip adduction      Hip internal rotation      Hip external rotation      Knee flexion 5 5  Knee extension NT  5  Ankle dorsiflexion      Ankle plantarflexion      Ankle inversion      Ankle eversion       (Blank rows = not tested)   LOWER EXTREMITY SPECIAL TESTS: None performed      FUNCTIONAL TESTS: None performed    GAIT: Distance walked: 50 ft  Assistive device utilized: Walker - 2 wheeled Level of assistance: Modified independence Comments: Decreased stance time on RLE and step length on LLE.     TODAY'S TREATMENT:  DATE:   02/16/23: Nu-Step seat and arms at 8 with resistance 3 for 5 min  Knee Flex R/L: 120/135 Knee Ext R/L: 2/0 FOTO: 67/100 predicted 58  Reviewed HEP and progressions for exercises with patient demonstrating understanding.     02/10/23: Matrix Recumbent Bicycle with resistance of 2 and seat setting of 12 for 5 min 10 meter x 2 ambulation  -No noticeable deficits in gait  Knee Flex AROM R/L 122/140    Full knee quad stretch 3 x 30 sec  -Pads placed under knees  Squat with #20 KB 2 x 10 -mod VC to maintain upright posture   02/04/23: Matrix Recumbent Bicycle with resistance of 3 and seat setting of 11 for 5 min Standing Forward Lunges on RLE 1 x 10  Walking  Forward Lunges 1 x 10  Omega Knee Extension on RLE #10 1 x 5  -Pt unable to achieve terminal knee extension  Omega Knee Extension on RLE #5 3 x 15 Omega HS curl on RLE #10 1 x 10  Omega HS curl on RLE #15 2 x 10     PATIENT EDUCATION:  Education details: form and technique for appropriate exercise and explanation about modalities for decreased swelling and pain relief  Person educated: Patient and wife  Education method: Consulting civil engineer, Media planner, Verbal cues, and Handouts Education comprehension: verbalized understanding, returned demonstration, and verbal cues required   HOME EXERCISE PROGRAM: Access Code: [email or web address] URL: [email or web address] Date: 02/16/2023 Prepared by: Bradly Chris  Exercises - Standing Knee Flexion Stretch on Step  - 1 x daily - 3 sets - 15 reps - Long Sitting Calf Stretch with Strap  - 1 x daily - 3 reps - 60 sec hold - Prone Quadriceps Stretch with Strap  - 1 x daily - 3 reps - 60 sec hold - Walking Forward Lunge  - 1 x daily - 10 reps - Forward Step Down with Heel Tap and Counter Support  - 3 x weekly - 3 sets - 10 reps - Side Step Down with Counter Support  - 3 x weekly - 3 sets - 10 reps - Standing Heel Raise  - 1 x daily - 3 sets - 15 reps - Single Leg Knee Extension with Weight Machine  - 3 x weekly - 3 sets - 10 reps - Single Leg Hamstring Curl with Weight Machine  - 3 x weekly - 3 sets - 10 reps  ASSESSMENT:   CLINICAL IMPRESSION: Pt presents s/p 13 week right TKA. He shows improved functional perception of right knee. He continues to show limitations with right knee knee ROM but he is able to perform functional tasks required for his job. He is now ready for discharge.   OBJECTIVE IMPAIRMENTS: Abnormal gait, decreased balance, decreased endurance, decreased mobility, difficulty walking, decreased ROM, decreased strength, increased edema, and pain.    ACTIVITY LIMITATIONS: bending, standing, squatting, stairs, bed mobility,  dressing, and locomotion level   PARTICIPATION LIMITATIONS: laundry, shopping, community activity, occupation, and yard work   PERSONAL FACTORS: Age, Profession, and 1 comorbidity: HTN   are also affecting patient's functional outcome.    REHAB POTENTIAL: Good   CLINICAL DECISION MAKING: Stable/uncomplicated   EVALUATION COMPLEXITY: Low     GOALS: Goals reviewed with patient? No   SHORT TERM GOALS: Target date: 12/09/2022   Pt will be independent with HEP in order to improve strength and balance in order to decrease fall risk and improve function at home and work. Baseline: Performing independently  Goal  status: Achieved    2.  Patient will demonstrate understanding of scar tissue massage technique and perform independently.  Baseline: Awaiting scaps to heal  12/28/22: Demonstrated ability to perform scar massage independently  Goal status: Achieved      LONG TERM GOALS: Target date: 02/03/2023   Patient will have improved function and activity level as evidenced by an increase in FOTO score by 10 points or more.  Baseline: 31   12/28/22: 52  02/16/23: 67 Goal status: Achieved     2.  Patient will improve right knee ROM flexion and extension to be nearly symmetrical to left knee AROM.   Baseline: Knee Flex R/L 60*/130, Knee Ext R/L 0*/15   12/28/22: Knee Flex R/L 105/135, Knee Ext R/L 0/4 01/07/23: Knee Ext R/L 4/0, Knee Flex R/L 104/140 02/01/23: Knee Flex 120/140 02/10/23: Knee Flex R/L 122/140 02/16/23: Knee Flex R/L 120/135 Knee Ext R/L 2/0  Goal status: Not Met    3.  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  Baseline: Right knee extension unable to test, Left knee extension 5/5 12/28/22: Knee Ext R/L 4+/5  Goal status: Achieved    4.  Patient will return to walking without an assistive device as a sign of right knee healing.  Baseline: Ambulating with RW  12/28/22: Currently  using [facility] 02/01/23: Not using single point cane  Goal status: Achieved       PLAN:   PT FREQUENCY: 1-2x/week   PT DURATION: 10 weeks   PLANNED INTERVENTIONS: Therapeutic exercises, Neuromuscular re-education, Balance training, Gait training, Patient/Family education, Self Care, Joint mobilization, Joint manipulation, DME instructions, Electrical stimulation, Manual therapy, and Re-evaluation, Dry needling, Cryotherapy, Aquatic therapy    PLAN FOR NEXT SESSION: Discharge from [facility], DPT

## 2023-02-18 ENCOUNTER — Ambulatory Visit: Payer: Medicare HMO | Admitting: Physical Therapy

## 2023-02-23 ENCOUNTER — Encounter: Payer: Medicare HMO | Admitting: Physical Therapy

## 2023-02-25 ENCOUNTER — Encounter: Payer: Medicare HMO | Admitting: Physical Therapy

## 2023-03-02 ENCOUNTER — Encounter: Payer: Medicare HMO | Admitting: Physical Therapy

## 2023-03-04 ENCOUNTER — Encounter: Payer: Medicare HMO | Admitting: Physical Therapy

## 2023-03-08 ENCOUNTER — Telehealth: Payer: Self-pay

## 2023-03-08 ENCOUNTER — Other Ambulatory Visit: Payer: Self-pay | Admitting: Family

## 2023-03-08 MED ORDER — TESTOSTERONE MICRONIZED POWD: 0 refills | Status: DC

## 2023-03-08 NOTE — Telephone Encounter (Signed)
Noted script send to pharmacy.

## 2023-03-08 NOTE — Telephone Encounter (Signed)
Patient request refill on testosterone gel. Please send to pharmacy.   Message sent to Marlowe Sax, NP

## 2023-03-09 MED ORDER — TESTOSTERONE CYPIONATE 200 MG/ML IJ SOLN: INTRAMUSCULAR | 0 refills | Status: DC

## 2023-03-09 NOTE — Telephone Encounter (Signed)
EPIC has no powder form.please see comment for gel.

## 2023-03-09 NOTE — Telephone Encounter (Signed)
Testosterone was sent in as powder it is suppose to be gel and sent to [facility].

## 2023-03-09 NOTE — Telephone Encounter (Signed)
Testosterone prescription resend to pharmacy.

## 2023-03-09 NOTE — Telephone Encounter (Signed)
This is what the last prescription read    Testosterone Cypionate 100 MG/ML SOLN [id number]  DISCONTINUED   Order Details Dose, Route, Frequency: As Directed  Dispense Quantity: 100 mL Refills: 3        Sig: 10% GEL. 4 clicks daily.       Start Date: 07/31/22 End Date: 03/08/23  Discontinued by: Sandrea Hughs, NP on 03/08/2023 17:17  Reason: Discontinued by provider       Written Date: 07/31/22 Expiration Date: 01/27/23  Medication Notes:  Sandrea Hughs, NP  -- 03/08/2023 17:17  uses gel unable order gel on EPIC     Associated Diagnoses: Erectile dysfunction, unspecified erectile dysfunction type [N52.9]  Original Order: Testosterone Cypionate 100 MG/ML SOLN [id number]  Providers  Authorizing Provider: Sandrea Hughs, NP [facility], [place] [address] Phone: [phone]   Fax: [phone] DEA #: [id number]   NPI: [id number]      Ordering User: Sandrea Hughs, NP      Pharmacy  [place], [place] [address], [place] [address] Phone: [phone]  Fax: [phone] DEA #: --  DAW Reason: --

## 2023-03-10 ENCOUNTER — Telehealth: Payer: Self-pay

## 2023-03-10 NOTE — Telephone Encounter (Signed)
Betsy with [facility] called to question dispense number for patients testosterone 200mg /ml solution. Betsy states rx was written for 1.96 mL, however in the past patient has received 60 mL and she wanted to confirm that it is ok to make this change. Change was authorized.    RX was edited to reflect 60 mL as the dispense and marked as historical

## 2023-04-29 ENCOUNTER — Telehealth: Payer: Self-pay

## 2023-04-29 ENCOUNTER — Ambulatory Visit (INDEPENDENT_AMBULATORY_CARE_PROVIDER_SITE_OTHER): Payer: Medicare HMO | Admitting: Adult Health

## 2023-04-29 VITALS — BP 137/88 | HR 88 | Temp 97.3°F | Resp 18 | Ht 68.0 in | Wt 178.2 lb

## 2023-04-29 DIAGNOSIS — R059 Cough, unspecified: Secondary | ICD-10-CM

## 2023-04-29 DIAGNOSIS — J029 Acute pharyngitis, unspecified: Secondary | ICD-10-CM

## 2023-04-29 DIAGNOSIS — I10 Essential (primary) hypertension: Secondary | ICD-10-CM

## 2023-04-29 DIAGNOSIS — R0989 Other specified symptoms and signs involving the circulatory and respiratory systems: Secondary | ICD-10-CM | POA: Diagnosis not present

## 2023-04-29 DIAGNOSIS — E349 Endocrine disorder, unspecified: Secondary | ICD-10-CM | POA: Diagnosis not present

## 2023-04-29 LAB — POCT INFLUENZA A/B
Influenza A, POC: NEGATIVE
Influenza B, POC: NEGATIVE

## 2023-04-29 LAB — POC COVID19 BINAXNOW: SARS Coronavirus 2 Ag: NEGATIVE

## 2023-04-29 LAB — POCT RAPID STREP A (OFFICE): Rapid Strep A Screen: NEGATIVE

## 2023-04-29 MED ORDER — FLUTICASONE PROPIONATE 50 MCG/ACT NA SUSP
2.0000 | Freq: Every day | NASAL | 6 refills | Status: DC
Start: 2023-04-29 — End: 2024-05-15

## 2023-04-29 MED ORDER — DOXYCYCLINE HYCLATE 100 MG PO TABS
100.0000 mg | ORAL_TABLET | Freq: Two times a day (BID) | ORAL | 0 refills | Status: AC
Start: 2023-04-29 — End: 2023-05-09

## 2023-04-29 MED ORDER — GUAIFENESIN ER 600 MG PO TB12
600.0000 mg | ORAL_TABLET | Freq: Two times a day (BID) | ORAL | 0 refills | Status: AC
Start: 2023-04-29 — End: 2023-05-09

## 2023-04-29 MED ORDER — TESTOSTERONE CYPIONATE 200 MG/ML IJ SOLN: INTRAMUSCULAR | 0 refills | Status: DC

## 2023-04-29 NOTE — Telephone Encounter (Signed)
That's what he requested.

## 2023-04-29 NOTE — Telephone Encounter (Signed)
Call returned to [facility] with Sergio [name]'s response. The pharmacist asked to speak with Sergio [name] and Sergio [name] picked up on the call

## 2023-04-29 NOTE — Progress Notes (Signed)
[facility] clinic  Provider: Kenard Gower DNP  Code Status:  Full Code  Goals of Care:     02/05/2023    8:55 AM  Advanced Directives  Does Patient Have a Medical Advance Directive? Yes  Type of Estate agent of Sergio [name];Living will  Does patient want to make changes to medical advance directive? No - Guardian declined     Chief Complaint  Patient presents with   Acute Visit    Sore throat, runny nose     HPI: Patient is a 72 y.o. [name] seen today for an acute visit for sore throat and runny nose. He was accompanied by his wife. He started having sore throat and congestion 2 days ago. He has runny nose, feels stuffed up and was gagging this morning. He has productive cough with yellowish phlegm. He felt short of breath last night but now improved. He denies fever nor chills. He works as a Systems analyst.Lucila Maine has colds, sore throat and nasal congestion. He has good appetite but feels so tired.  Tested negative for COVID-19, Influenza A/B and strep throat A today at the clinic.   Past Medical History:  Diagnosis Date   Arthritis    Enlarged prostate    Erectile dysfunction    History of colonoscopy    History of COVID-19    Hypertension    Low testosterone    Low testosterone    Pneumonia    2022    Past Surgical History:  Procedure Laterality Date   COLONOSCOPY     RETINAL DETACHMENT SURGERY Right    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   Procedure: TOTAL KNEE ARTHROPLASTY;  Surgeon: Joen Laura, MD;  Location: [facility];  Service: Orthopedics;  Laterality: Right;   WISDOM TOOTH EXTRACTION      No Known Allergies  Outpatient Encounter Medications as of 04/29/2023  Medication Sig   ascorbic acid (VITAMIN C) 500 MG tablet Take 500 mg by mouth daily.   aspirin EC 81 MG tablet Take 81 mg by mouth daily.   doxycycline (VIBRA-TABS) 100 MG tablet Take 1 tablet (100 mg total) by mouth 2 (two) times daily for 10 days.    fluticasone (FLONASE) 50 MCG/ACT nasal spray Place 2 sprays into both nostrils daily.   guaiFENesin (MUCINEX) 600 MG 12 hr tablet Take 1 tablet (600 mg total) by mouth 2 (two) times daily for 10 days.   hydrochlorothiazide (HYDRODIURIL) 25 MG tablet TAKE 1 TABLET (25 MG TOTAL) BY MOUTH DAILY.   lisinopril (ZESTRIL) 20 MG tablet TAKE 1 TABLET BY MOUTH EVERY DAY   Multiple Vitamin (MULTIVITAMIN) tablet Take 1 tablet by mouth daily.   sildenafil (REVATIO) 20 MG tablet Take 1 tablet (20 mg total) by mouth as needed.   silodosin (RAPAFLO) 8 MG CAPS capsule Take 1 capsule (8 mg total) by mouth daily with breakfast.   [DISCONTINUED] Testosterone Cypionate 200 MG/ML SOLN Testosterone cypionate 100 mg /ml use 10% gel 4 clicks daily   [DISCONTINUED] Testosterone Micronized POWD 10% gel 4 clicks daily   Testosterone Cypionate 200 MG/ML SOLN Testosterone cypionate 100 mg /ml use 10% gel 4 clicks daily   No facility-administered encounter medications on file as of 04/29/2023.    Review of Systems:  Review of Systems  Constitutional:  Negative for activity change, appetite change and fever.  HENT:  Negative for sore throat.   Eyes: Negative.   Cardiovascular:  Negative for chest pain and leg swelling.  Gastrointestinal:  Negative for abdominal  distention, diarrhea and vomiting.  Genitourinary:  Negative for dysuria, frequency and urgency.  Skin:  Negative for color change.  Neurological:  Negative for dizziness and headaches.  Psychiatric/Behavioral:  Negative for behavioral problems and sleep disturbance. The patient is not nervous/anxious.     Health Maintenance  Topic Date Due   Medicare Annual Wellness (AWV)  Never done   DTaP/Tdap/Td (1 - Tdap) Never done   Zoster Vaccines- Shingrix (1 of 2) Never done   COVID-19 Vaccine (3 - 2023-24 season) 08/07/2022   INFLUENZA VACCINE  07/08/2023   Colonoscopy  10/18/2030   Pneumonia Vaccine 24+ Years old  Completed   Hepatitis C Screening  Completed    HPV VACCINES  Aged Out    Physical Exam: Vitals:   04/29/23 1511  BP: 137/88  Pulse: 88  Resp: 18  Temp: (!) 97.3 F (36.3 C)  SpO2: 96%  Weight: 178 lb 3.2 oz (80.8 kg)  Height: 5\' 8"  (1.727 m)   Body mass index is 27.1 kg/m. Physical Exam Constitutional:      Appearance: Normal appearance.  HENT:     Head: Normocephalic and atraumatic.     Mouth/Throat:     Mouth: Mucous membranes are moist.  Eyes:     Conjunctiva/sclera: Conjunctivae normal.  Cardiovascular:     Rate and Rhythm: Normal rate and regular rhythm.     Pulses: Normal pulses.     Heart sounds: Normal heart sounds.  Pulmonary:     Effort: Pulmonary effort is normal.     Breath sounds: Normal breath sounds.  Abdominal:     General: Bowel sounds are normal.     Palpations: Abdomen is soft.  Musculoskeletal:        General: No swelling. Normal range of motion.     Cervical back: Normal range of motion.     Left lower leg: Edema present.  Skin:    General: Skin is warm and dry.  Neurological:     General: No focal deficit present.     Mental Status: He is alert and oriented to person, place, and time.  Psychiatric:        Mood and Affect: Mood normal.        Behavior: Behavior normal.        Thought Content: Thought content normal.        Judgment: Judgment normal.     Labs reviewed: Basic Metabolic Panel: Recent Labs    07/31/22 0917 11/17/22 1000 11/24/22 0410 02/05/23 1002  NA 134* 131* 132* 133*  K 4.2 3.8 3.8 3.8  CL 100 99 102 96*  CO2 24 24 23 28   GLUCOSE 96 92 162* 83  BUN 10 12 Sergio 11   CREATININE 1.16 0.83 1.02 0.88  CALCIUM 9.1 9.3 8.7* 9.8  TSH 1.41  --   --  1.18   Liver Function Tests: Recent Labs    07/31/22 0917 02/05/23 1002  AST 23 21  ALT 24 18  BILITOT 1.3* 1.1  PROT 6.7 6.9   No results for input(s): "LIPASE", "AMYLASE" in the last 8760 hours. No results for input(s): "AMMONIA" in the last 8760 hours. CBC: Recent Labs    07/31/22 0917 11/02/22 0819  11/24/22 0410 02/05/23 1002  WBC 4.7 5.6 10.2 5.6  NEUTROABS 2,834 3.4  --  3,629  HGB Sergio.2 Sergio.3 13.1 Sergio.5  HCT 50.1* 48.7 42.0 50.5*  MCV 71.4* 69.9* 70.2* 74.2*  PLT 188 218 198 210   Lipid Panel: Recent Labs  07/31/22 0917 02/05/23 1002  CHOL 152 156  HDL 42 43  LDLCALC 91 91  TRIG 91 127  CHOLHDL 3.6 3.6   No results found for: "HGBA1C"  Procedures since last visit: No results found.  Assessment/Plan  1. Acute pharyngitis, unspecified etiology - doxycycline (VIBRA-TABS) 100 MG tablet; Take 1 tablet (100 mg total) by mouth 2 (two) times daily for 10 days.  Dispense: 20 tablet; Refill: 0 - guaiFENesin (MUCINEX) 600 MG 12 hr tablet; Take 1 tablet (600 mg total) by mouth 2 (two) times daily for 10 days.  Dispense: 20 tablet; Refill: 0 - fluticasone (FLONASE) 50 MCG/ACT nasal spray; Place 2 sprays into both nostrils daily.  Dispense: 16 g; Refill: 6  2. Cough, unspecified type Runny nose Sore throat - POC COVID-19 negative - POC Influenza A/B negative - POCT rapid strep A negative  3. Testosterone deficiency - Testosterone Cypionate 200 MG/ML SOLN; Testosterone cypionate 100 mg /ml use 10% gel 4 clicks daily  Dispense: 60 mL; Refill: 0  4. Essential hypertension -  BP 137/88, stable -  continue HCTZ and Lisinopril     Labs/tests ordered:  POC COVID-19, rapid strep A, Influenza A/B   Next appt:  08/13/2023

## 2023-04-29 NOTE — Telephone Encounter (Signed)
Betsy called to question rx sent over by [facility]ina[facility]arg[facility] for Testosterone Cypionate, patient has received Testosterone 200mg /mL (without the cypionate in the past). The pharmacy would like to verify rx before filling.   Pharmacist states if the testosterone cypionate was sent in error, please submit a new rx for the correct medication

## 2023-05-04 ENCOUNTER — Other Ambulatory Visit: Payer: Self-pay

## 2023-05-04 DIAGNOSIS — D751 Secondary polycythemia: Secondary | ICD-10-CM

## 2023-05-05 ENCOUNTER — Inpatient Hospital Stay: Payer: Medicare HMO | Admitting: Hematology and Oncology

## 2023-05-05 ENCOUNTER — Inpatient Hospital Stay: Payer: Medicare HMO

## 2023-05-21 ENCOUNTER — Encounter: Payer: Self-pay | Admitting: Family

## 2023-05-21 ENCOUNTER — Ambulatory Visit
Admission: RE | Admit: 2023-05-21 | Discharge: 2023-05-21 | Disposition: A | Discharge status: Home / Self Care | Payer: Medicare HMO | Source: Ambulatory Visit | Attending: Family | Admitting: Family

## 2023-05-21 ENCOUNTER — Ambulatory Visit (INDEPENDENT_AMBULATORY_CARE_PROVIDER_SITE_OTHER): Payer: Medicare HMO | Admitting: Family

## 2023-05-21 VITALS — BP 114/76 | HR 88 | Temp 98.0°F | Ht 68.0 in | Wt 176.6 lb

## 2023-05-21 DIAGNOSIS — R052 Subacute cough: Secondary | ICD-10-CM

## 2023-05-21 MED ORDER — GUAIFENESIN ER 600 MG PO TB12
600.0000 mg | ORAL_TABLET | Freq: Two times a day (BID) | ORAL | 0 refills | Status: AC
Start: 2023-05-21 — End: 2023-05-31

## 2023-05-21 NOTE — Patient Instructions (Addendum)
-   Take mucinex twice daily for cough   - Please get chest X-ray at [facility] imaging at [facility] then will call you with results.  - Notify provider or go to ED if symptoms worsen or fail to improve

## 2023-05-21 NOTE — Progress Notes (Signed)
Provider: Richarda Blade FNP-C  Vanassa Penniman, Donalee Citrin, NP  Patient Care Team: Swayzee Wadley, Donalee Citrin, NP as PCP - General (Family Medicine) Serena Croissant, MD as Consulting Physician (Hematology and Oncology)  Extended Emergency Contact Information Primary Emergency Contact: Ganci,[name] Home Phone: [phone] Mobile Phone: [phone] Relation: Spouse Secondary Emergency Contact: [name],[name] Mobile Phone: [phone] Relation: Daughter  Code Status:  Full Code Goals of care: Advanced Directive information    02/05/2023    8:55 AM  Advanced Directives  Does Patient Have a Medical Advance Directive? Yes  Type of Estate agent of [place];Living will  Does patient want to make changes to medical advance directive? No - Guardian declined     Chief Complaint  Patient presents with   Acute Visit    Patient presents today for a cough w/ mucus for 3 weeks ago.    HPI:  Pt is a 72 y.o. male seen today for an acute visit for evaluation of cough x 3 weeks.coughing up phlegm since 04/29/2023 .He was treated by Serena Colonel for acute Pharyngitis.He was treated with Doxycycline.  Has been been taking mucinex,Nquil,Flonase and dayQuil. Runny nose every now and then. He denies any fever ,chills,fatigue,wheezing ,shortness of breath.  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   aspirin EC 81 MG tablet Take 81 mg by mouth daily.   fluticasone (FLONASE) 50 MCG/ACT nasal spray Place 2 sprays into both nostrils daily.   hydrochlorothiazide (HYDRODIURIL) 25 MG tablet TAKE 1 TABLET (25 MG TOTAL) BY MOUTH DAILY.   lisinopril (ZESTRIL) 20 MG tablet TAKE 1 TABLET BY MOUTH EVERY DAY   Multiple Vitamin (MULTIVITAMIN) tablet Take 1 tablet by mouth daily.   sildenafil (REVATIO) 20 MG tablet Take 1 tablet (20 mg total) by mouth as needed.   silodosin (RAPAFLO) 8 MG CAPS capsule Take 1 capsule (8 mg total) by mouth daily with breakfast.   Testosterone Cypionate 200 MG/ML SOLN Testosterone cypionate 100 mg /ml use 10% gel 4 clicks daily   Testosterone POWD 200 mg by Does not apply route.   No facility-administered encounter medications on file as of 05/21/2023.    Review of Systems  Constitutional:  Negative for appetite change, chills, fatigue, fever and unexpected weight change.  HENT:  Negative for congestion, ear discharge, ear pain, facial swelling, hearing loss, nosebleeds, postnasal drip, rhinorrhea, sinus pressure, sinus pain, sneezing, sore throat, tinnitus and trouble swallowing.        Nasal drainage sometimes   Eyes:  Negative for pain, discharge, redness, itching and visual disturbance.  Respiratory:  Positive for cough. Negative for chest tightness, shortness of breath and wheezing.   Cardiovascular:  Negative for chest pain, palpitations and leg swelling.  Gastrointestinal:  Negative for abdominal distention, abdominal pain, blood in stool, constipation, diarrhea, nausea and vomiting.  Endocrine: Negative for cold intolerance, heat intolerance, polydipsia, polyphagia and polyuria.  Genitourinary:  Negative for difficulty urinating, dysuria, flank pain, frequency and urgency.  Musculoskeletal:  Negative for arthralgias, back pain, gait problem, joint swelling, myalgias, neck pain and neck  stiffness.  Skin:  Negative for color change, pallor, rash and wound.  Neurological:  Negative for dizziness, syncope, speech difficulty, weakness, light-headedness, numbness and headaches.  Hematological:  Does not bruise/bleed easily.  Psychiatric/Behavioral:  Negative for agitation, behavioral problems, confusion, hallucinations, self-injury, sleep disturbance and suicidal ideas. The patient is not nervous/anxious.     Immunization History  Administered Date(s) Administered   Fluad Quad(high Dose 65+) 01/23/2022   Moderna Sars-Covid-2 Vaccination 12/11/2019, 01/08/2020   PNEUMOCOCCAL CONJUGATE-20 02/05/2023   Pertinent  Health Maintenance Due  Topic Date Due   INFLUENZA VACCINE  07/08/2023   Colonoscopy  10/18/2030      11/23/2022    8:00 PM 11/24/2022    8:00 AM 01/25/2023    8:50 AM 02/05/2023    8:55 AM 04/29/2023    3:12 PM  Fall Risk  Falls in the past year?   0 0 0  Was there an injury with Fall?   0 0 0  Fall Risk Category Calculator   0 0 0  (RETIRED) Patient Fall Risk Level High fall risk High fall risk     Patient at Risk for Falls Due to   No Fall Risks No Fall Risks No Fall Risks  Fall risk Follow up   Falls evaluation completed Falls evaluation completed    Functional Status Survey:    Vitals:   05/21/23 1419  BP: 114/76  Pulse: 88  Temp: 98 F (36.7 C)  SpO2: 97%  Weight: 176 lb 9.6 oz (80.1 kg)  Height: 5\' 8"  (1.727 m)   Body mass index is 26.85 kg/m. Physical Exam Vitals reviewed.  Constitutional:      General: He is not in acute distress.    Appearance: Normal appearance. He is overweight. He is not ill-appearing or diaphoretic.  HENT:     Head: Normocephalic.     Right Ear: Tympanic membrane, ear canal and external ear normal. There is no impacted cerumen.     Left Ear: Tympanic membrane, ear canal and external ear normal. There is no impacted cerumen.     Nose: Nose normal. No congestion or rhinorrhea.     Mouth/Throat:     Mouth: Mucous  membranes are moist.     Pharynx: Oropharynx is clear. No oropharyngeal exudate or posterior oropharyngeal erythema.  Eyes:     General: No scleral icterus.       Right eye: No discharge.        Left eye: No discharge.     Extraocular Movements: Extraocular movements intact.     Conjunctiva/sclera: Conjunctivae normal.     Pupils: Pupils are equal, round, and reactive to light.  Neck:     Vascular: No carotid bruit.  Cardiovascular:     Rate and Rhythm: Normal rate and regular rhythm.     Pulses: Normal pulses.     Heart sounds: Normal heart sounds. No murmur heard.    No friction rub. No gallop.  Pulmonary:     Effort: Pulmonary effort is normal. No respiratory distress.     Breath sounds: Decreased air movement present. Examination of the left-middle field reveals wheezing. Examination of the right-lower field reveals wheezing.  Examination of the left-lower field reveals wheezing. Wheezing present. No rhonchi or rales.  Chest:     Chest wall: No tenderness.  Abdominal:     General: Bowel sounds are normal. There is no distension.     Palpations: Abdomen is soft. There is no mass.     Tenderness: There is no abdominal tenderness. There is no right CVA tenderness, left CVA tenderness, guarding or rebound.  Musculoskeletal:        General: No swelling or tenderness. Normal range of motion.     Cervical back: Normal range of motion. No rigidity or tenderness.     Right lower leg: No edema.     Left lower leg: No edema.  Lymphadenopathy:     Cervical: No cervical adenopathy.  Skin:    General: Skin is warm and dry.     Coloration: Skin is not pale.     Findings: No erythema or rash.  Neurological:     Mental Status: He is alert and oriented to person, place, and time.     Cranial Nerves: No cranial nerve deficit.     Sensory: No sensory deficit.     Motor: No weakness.     Coordination: Coordination normal.     Gait: Gait normal.  Psychiatric:        Mood and Affect: Mood  normal.        Speech: Speech normal.        Behavior: Behavior normal.     Labs reviewed: Recent Labs    11/17/22 1000 11/24/22 0410 02/05/23 1002  NA 131* 132* 133*  K 3.8 3.8 3.8  CL 99 102 96*  CO2 24 23 28   GLUCOSE 92 162* 83  BUN 12 15 11   CREATININE 0.83 1.02 0.88  CALCIUM 9.3 8.7* 9.8   Recent Labs    07/31/22 0917 02/05/23 1002  AST 23 21  ALT 24 18  BILITOT 1.3* 1.1  PROT 6.7 6.9   Recent Labs    07/31/22 0917 11/02/22 0819 11/24/22 0410 02/05/23 1002  WBC 4.7 5.6 10.2 5.6  NEUTROABS 2,834 3.4  --  3,629  HGB 15.2 15.3 13.1 15.5  HCT 50.1* 48.7 42.0 50.5*  MCV 71.4* 69.9* 70.2* 74.2*  PLT 188 218 198 210   Lab Results  Component Value Date   TSH 1.18 02/05/2023   No results found for: "HGBA1C" Lab Results  Component Value Date   CHOL 156 02/05/2023   HDL 43 02/05/2023   LDLCALC 91 02/05/2023   TRIG 127 02/05/2023   CHOLHDL 3.6 02/05/2023    Significant Diagnostic Results in last 30 days:  No results found.  Assessment/Plan  Subacute cough Afebrile  Bilateral Lower lobe and left mid lobe lung diminished to auscultation.cough ongoing for several weeks. Will obtain imaging to rule out acute abnormalities. - breathing stable will hold off on starting antibiotics until X-ray results  obtain. Aware to notify provider if running any fever or symptoms worsen.  - DG Chest 2 View; Future - - Please get chest X-ray at [facility] at [facility] then will call you with results. - guaiFENesin (MUCINEX) 600 MG 12 hr tablet; Take 1 tablet (600 mg total) by mouth 2 (two) times daily for 10 days.  Dispense: 20 tablet; Refill: 0  Family/ staff Communication: Reviewed plan of care with patient verbalized understanding.   Labs/tests ordered:  - DG Chest 2 View; Future  Next Appointment: Return if symptoms worsen or fail to  improve.   Caesar Bookman, NP

## 2023-05-25 ENCOUNTER — Telehealth: Payer: Self-pay | Admitting: Hematology and Oncology

## 2023-05-26 ENCOUNTER — Inpatient Hospital Stay: Payer: Medicare HMO | Admitting: Hematology and Oncology

## 2023-05-26 ENCOUNTER — Inpatient Hospital Stay: Payer: Medicare HMO

## 2023-05-27 ENCOUNTER — Telehealth: Payer: Self-pay

## 2023-05-27 NOTE — Telephone Encounter (Signed)
Patient called about his x-ray results that were done on 05/21/23. He would like to know if anything was find on the xray. Please advise.

## 2023-05-27 NOTE — Telephone Encounter (Signed)
-    chest x-ray showed no active pulmonary disease

## 2023-05-28 NOTE — Telephone Encounter (Signed)
Patient was advised and verbalized understanding. 

## 2023-05-28 NOTE — Progress Notes (Signed)
-    chest x-ray negative for pneumonia

## 2023-05-31 ENCOUNTER — Other Ambulatory Visit: Payer: Medicare HMO

## 2023-05-31 ENCOUNTER — Ambulatory Visit: Payer: Medicare HMO | Admitting: Hematology and Oncology

## 2023-06-05 ENCOUNTER — Ambulatory Visit (HOSPITAL_COMMUNITY)
Admission: RE | Admit: 2023-06-05 | Discharge: 2023-06-05 | Disposition: A | Discharge status: Home / Self Care | Payer: Medicare HMO | Source: Ambulatory Visit | Attending: Physician Assistant | Admitting: Physician Assistant

## 2023-06-05 ENCOUNTER — Other Ambulatory Visit (HOSPITAL_COMMUNITY): Payer: Self-pay | Admitting: Physician Assistant

## 2023-06-05 DIAGNOSIS — M79604 Pain in right leg: Secondary | ICD-10-CM

## 2023-06-05 DIAGNOSIS — M79661 Pain in right lower leg: Secondary | ICD-10-CM | POA: Diagnosis not present

## 2023-06-05 NOTE — Progress Notes (Signed)
VASCULAR LAB    Right lower extremity venous duplex has been performed.  See CV proc for preliminary results.  Called Kirstin Shepperson PA-C with results  Alexxander Kurt, RVT 06/05/2023, 1:59 PM

## 2023-06-17 NOTE — Progress Notes (Signed)
Patient Care Team: Ngetich, Donalee Citrin, NP as PCP - General (Family Medicine) Serena Croissant, MD as Consulting Physician (Hematology and Oncology)  DIAGNOSIS:  Encounter Diagnosis  Name Primary?   Secondary erythrocytosis Yes     CHIEF COMPLIANT: Follow-up secondary erythrocytosis   INTERVAL HISTORY: Sergio [name] is a 72 y.o. 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 He presents to the clinic for a follow-up. Pt reports that he is doing well. He has no complaints or issues to report to the clinic today.  ALLERGIES:  has No Known Allergies.  MEDICATIONS:  Current Outpatient Medications  Medication Sig Dispense Refill   ascorbic acid (VITAMIN C) 500 MG tablet Take 500 mg by mouth daily.     aspirin EC 81 MG tablet Take 81 mg by mouth daily.     fluticasone (FLONASE) 50 MCG/ACT nasal spray Place 2 sprays into both nostrils daily. 16 g 6   hydrochlorothiazide (HYDRODIURIL) 25 MG tablet TAKE 1 TABLET (25 MG TOTAL) BY MOUTH DAILY. 90 tablet 2   lisinopril (ZESTRIL) 20 MG tablet TAKE 1 TABLET BY MOUTH EVERY DAY 90 tablet 1   Multiple Vitamin (MULTIVITAMIN) tablet Take 1 tablet by mouth daily.     sildenafil (REVATIO) 20 MG tablet Take 1 tablet (20 mg total) by mouth as needed. 10 tablet 0   silodosin (RAPAFLO) 8 MG CAPS capsule Take 1 capsule (8 mg total) by mouth daily with breakfast. 30 capsule    Testosterone Cypionate 200 MG/ML SOLN Testosterone cypionate 100 mg /ml use 10% gel 4 clicks daily 60 mL 0   Testosterone POWD 200 mg by Does not apply route.     No current facility-administered medications for this visit.    PHYSICAL EXAMINATION: ECOG PERFORMANCE STATUS: 0 - Asymptomatic  Vitals:   06/18/23 0803  BP: 131/88  Pulse: 74  Resp: 18  Temp: 98.8 F (37.1 C)  SpO2: 97%   Filed Weights   06/18/23 0803  Weight: 177 lb 14.4 oz (80.7 kg)      LABORATORY DATA:  I have reviewed the data as listed    Latest Ref  Rng & Units 02/05/2023   10:02 AM 11/24/2022    4:10 AM 11/17/2022   10:00 AM  CMP  Glucose 65 - 99 mg/dL 83  119  92   BUN 7 - 25 mg/dL 11  15  12    Creatinine 0.70 - 1.28 mg/dL 1.47  8.29  5.62   Sodium 135 - 146 mmol/L 133  132  131   Potassium 3.5 - 5.3 mmol/L 3.8  3.8  3.8   Chloride 98 - 110 mmol/L 96  102  99   CO2 20 - 32 mmol/L 28  23  24    Calcium 8.6 - 10.3 mg/dL 9.8  8.7  9.3   Total Protein 6.1 - 8.1 g/dL 6.9     Total Bilirubin 0.2 - 1.2 mg/dL 1.1     AST 10 - 35 U/L 21     ALT 9 - 46 U/L 18       Lab Results  Component Value Date   WBC 6.6 06/18/2023   HGB 15.2 06/18/2023   HCT 47.3 06/18/2023   MCV 71.5 (L) 06/18/2023   PLT 170 06/18/2023   NEUTROABS 4.6 06/18/2023    ASSESSMENT & PLAN:  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 Previous work-up 2019: MPN panel: Negative for Jak 2, MPL, CALR Erythropoietin 11.3, hemoglobin 19.9  Current treatment: Phlebotomies every 2 weeks started 11/11/2018 switched to monthly phlebotomies 02/21/2019 switch to every 2 months 05/22/2019, switched to every 69-month starting 11/20/2019   Lab review:  07/31/22: Hemoglobin 15.2  11/02/2022: Hemoglobin 15.3 02/05/2023: Hemoglobin 15.5, hematocrit 50.5   Patient works as a Chartered certified accountant: Every 2 months phlebotomies at [employer].  (Goal hematocrit below 50) Return to clinic in 1 year for follow-up with labs    No orders of the defined types were placed in this encounter.  The patient has a good understanding of the overall plan. he agrees with it. he will call with any problems that may develop before the next visit here. Total time spent: 30 mins including face to face time and time spent for planning, charting and co-ordination of care   Tamsen Meek, MD 06/18/23    I Janan Ridge am acting as a Neurosurgeon for [employer]  I have reviewed the above documentation for accuracy and completeness, and I agree with the  above.

## 2023-06-18 ENCOUNTER — Other Ambulatory Visit: Payer: Self-pay

## 2023-06-18 ENCOUNTER — Inpatient Hospital Stay: Payer: Medicare HMO | Admitting: Hematology and Oncology

## 2023-06-18 ENCOUNTER — Inpatient Hospital Stay: Payer: Medicare HMO | Attending: Hematology and Oncology

## 2023-06-18 VITALS — BP 131/88 | HR 74 | Temp 98.8°F | Resp 18 | Ht 68.0 in | Wt 177.9 lb

## 2023-06-18 DIAGNOSIS — D751 Secondary polycythemia: Secondary | ICD-10-CM | POA: Diagnosis present

## 2023-06-18 LAB — CBC WITH DIFFERENTIAL (CANCER CENTER ONLY)
Abs Immature Granulocytes: 0.03 10*3/uL (ref 0.00–0.07)
Basophils Absolute: 0 10*3/uL (ref 0.0–0.1)
Basophils Relative: 1 %
Eosinophils Absolute: 0.1 10*3/uL (ref 0.0–0.5)
Eosinophils Relative: 1 %
HCT: 47.3 % (ref 39.0–52.0)
Hemoglobin: 15.2 g/dL (ref 13.0–17.0)
Immature Granulocytes: 1 %
Lymphocytes Relative: 19 %
Lymphs Abs: 1.3 10*3/uL (ref 0.7–4.0)
MCH: 23 pg — ABNORMAL LOW (ref 26.0–34.0)
MCHC: 32.1 g/dL (ref 30.0–36.0)
MCV: 71.5 fL — ABNORMAL LOW (ref 80.0–100.0)
Monocytes Absolute: 0.6 10*3/uL (ref 0.1–1.0)
Monocytes Relative: 9 %
Neutro Abs: 4.6 10*3/uL (ref 1.7–7.7)
Neutrophils Relative %: 69 %
Platelet Count: 170 10*3/uL (ref 150–400)
RBC: 6.62 MIL/uL — ABNORMAL HIGH (ref 4.22–5.81)
RDW: 21.3 % — ABNORMAL HIGH (ref 11.5–15.5)
WBC Count: 6.6 10*3/uL (ref 4.0–10.5)
nRBC: 0 % (ref 0.0–0.2)

## 2023-06-18 NOTE — Assessment & Plan Note (Signed)
Due to testosterone replacement therapy with androgen. 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  (Goal hematocrit below 45) Return to clinic in 6 months for follow-up with labs

## 2023-06-21 ENCOUNTER — Telehealth: Payer: Self-pay | Admitting: Hematology and Oncology

## 2023-06-21 NOTE — Telephone Encounter (Signed)
 Scheduled appointments per 7/12 los. Patient is aware of the made appointments.

## 2023-07-07 ENCOUNTER — Ambulatory Visit: Payer: Medicare HMO | Admitting: Cardiology

## 2023-07-09 ENCOUNTER — Other Ambulatory Visit: Payer: Self-pay | Admitting: Family

## 2023-07-09 DIAGNOSIS — I10 Essential (primary) hypertension: Secondary | ICD-10-CM

## 2023-07-19 ENCOUNTER — Other Ambulatory Visit: Payer: Self-pay | Admitting: *Deleted

## 2023-07-19 DIAGNOSIS — E349 Endocrine disorder, unspecified: Secondary | ICD-10-CM

## 2023-07-19 MED ORDER — TESTOSTERONE CYPIONATE 200 MG/ML IJ SOLN: INTRAMUSCULAR | 1 refills | Status: DC

## 2023-07-19 NOTE — Telephone Encounter (Signed)
[facility] called requesting refill.  Last Rf: 04/29/23  Pended Rx and sent to [facility] for approval.

## 2023-07-22 ENCOUNTER — Encounter: Payer: Self-pay | Admitting: Cardiology

## 2023-07-22 ENCOUNTER — Ambulatory Visit: Payer: Medicare HMO | Admitting: Cardiology

## 2023-07-22 VITALS — BP 126/86 | HR 80 | Resp 16 | Ht 68.0 in | Wt 176.2 lb

## 2023-07-22 DIAGNOSIS — I10 Essential (primary) hypertension: Secondary | ICD-10-CM

## 2023-07-22 NOTE — Progress Notes (Signed)
Follow up visit  Subjective:   Sergio [name], male    DOB: 26-Dec-1950, 72 y.o.   MRN: [id number]  HPI  Chief Complaint  Patient presents with   Hypertension   Follow-up    72 y.o. Caucasian male with hypertension  Patient was recently seen in our office prior to his knee replacement surgery.  Cardiac workup was unremarkable, detailed below. Blood pressure is well controlled.    Current Outpatient Medications:    ascorbic acid (VITAMIN C) 500 MG tablet, Take 500 mg by mouth daily., Disp: , Rfl:    aspirin EC 81 MG tablet, Take 81 mg by mouth daily., Disp: , Rfl:    fluticasone (FLONASE) 50 MCG/ACT nasal spray, Place 2 sprays into both nostrils daily., Disp: 16 g, Rfl: 6   hydrochlorothiazide (HYDRODIURIL) 25 MG tablet, TAKE 1 TABLET (25 MG TOTAL) BY MOUTH DAILY., Disp: 90 tablet, Rfl: 2   lisinopril (ZESTRIL) 20 MG tablet, TAKE 1 TABLET BY MOUTH EVERY DAY, Disp: 90 tablet, Rfl: 1   Multiple Vitamin (MULTIVITAMIN) tablet, Take 1 tablet by mouth daily., Disp: , Rfl:    sildenafil (REVATIO) 20 MG tablet, Take 1 tablet (20 mg total) by mouth as needed., Disp: 10 tablet, Rfl: 0   silodosin (RAPAFLO) 8 MG CAPS capsule, Take 1 capsule (8 mg total) by mouth daily with breakfast., Disp: 30 capsule, Rfl:    Testosterone Cypionate 200 MG/ML SOLN, Testosterone cypionate 100 mg /ml use 10% gel 4 clicks daily, Disp: 60 mL, Rfl: 1   Testosterone POWD, 200 mg by Does not apply route., Disp: , Rfl:    Cardiovascular & other pertient studies:  Reviewed external labs and tests, independently interpreted  EKG 07/22/2023: Sinus rhythm 79 bpm Left atrial enlargement Nonspecific T-abnormality    Echocardiogram 10/20/2022: Left ventricle cavity is normal in size. Mild concentric hypertrophy of the left ventricle. Normal global wall motion. Normal LV systolic function with EF 55%. Doppler evidence of grade I (impaired) diastolic dysfunction, normal LAP. Mild tricuspid regurgitation. No  evidence of pulmonary hypertension.  Lexiscan (with Mod Bruce protocol) Nuclear stress test 10/20/2022: Myocardial perfusion is normal. Overall LV systolic function is normal without regional wall motion abnormalities. Stress LV EF: 58%. Low risk study. Nondiagnostic ECG stress. The heart rate response was consistent with Regadenoson. The blood pressure response was physiologic. No previous exam available for comparison.    Recent labs: March-July 2024: Glucose 83, BUN/Cr 11/0.88. EGFR 92. Na/K 133/3.8. Rest of the CMP normal H/H 15/47. MCV 71. Platelets 170 HbA1C NA Chol 156, TG 127, HDL 43, LDL 91 TSH 1.1 normal   Review of Systems  Cardiovascular:  Negative for chest pain, dyspnea on exertion, leg swelling, palpitations and syncope.         Vitals:   07/22/23 1253  BP: 126/86  Pulse: 80  Resp: 16  SpO2: 97%    Body mass index is 26.79 kg/m. Filed Weights   07/22/23 1253  Weight: 176 lb 3.2 oz (79.9 kg)     Objective:   Physical Exam Vitals and nursing note reviewed.  Constitutional:      General: He is not in acute distress. Neck:     Vascular: No JVD.  Cardiovascular:     Rate and Rhythm: Normal rate and regular rhythm.     Heart sounds: Normal heart sounds. No murmur heard. Pulmonary:     Effort: Pulmonary effort is normal.     Breath sounds: Normal breath sounds. No wheezing or rales.  Musculoskeletal:     Right lower leg: No edema.     Left lower leg: No edema.             Visit diagnoses:   ICD-10-CM   1. Essential hypertension  I10 EKG 12-Lead       Orders Placed This Encounter  Procedures   EKG 12-Lead     Medication changes this visit: Medications Discontinued During This Encounter  Medication Reason   aspirin EC 81 MG tablet Discontinued by provider     Assessment & Recommendations:   72 y.o. Caucasian male with hypertension  Hypertension: Controlled. Continue lisinopril.  Okay to stop Aspirin. He is not interested  in statin therapy, thus not checking coronary calcium score.  F/u as needed.      Elder Negus, MD Pager: [phone] Office: [phone]

## 2023-07-29 ENCOUNTER — Ambulatory Visit: Payer: Medicare HMO | Admitting: Family

## 2023-08-05 ENCOUNTER — Ambulatory Visit (INDEPENDENT_AMBULATORY_CARE_PROVIDER_SITE_OTHER): Payer: Medicare HMO | Admitting: Family

## 2023-08-05 VITALS — BP 132/80 | HR 76 | Temp 98.1°F | Resp 18 | Ht 68.0 in | Wt 173.0 lb

## 2023-08-05 DIAGNOSIS — N400 Enlarged prostate without lower urinary tract symptoms: Secondary | ICD-10-CM | POA: Diagnosis not present

## 2023-08-05 DIAGNOSIS — E785 Hyperlipidemia, unspecified: Secondary | ICD-10-CM

## 2023-08-05 DIAGNOSIS — I10 Essential (primary) hypertension: Secondary | ICD-10-CM

## 2023-08-05 DIAGNOSIS — D751 Secondary polycythemia: Secondary | ICD-10-CM

## 2023-08-05 DIAGNOSIS — E349 Endocrine disorder, unspecified: Secondary | ICD-10-CM | POA: Diagnosis not present

## 2023-08-05 NOTE — Progress Notes (Signed)
Provider: Richarda Blade FNP-C   Samaia Iwata, Donalee Citrin, NP  Patient Care Team: Rielyn Krupinski, Donalee Citrin, NP as PCP - General (Family Medicine) Serena Croissant, MD as Consulting Physician (Hematology and Oncology)  Extended Emergency Contact Information Primary Emergency Contact: Crute,[name] Home Phone: [phone] Mobile Phone: [phone] Relation: Spouse Secondary Emergency Contact: [name],[name] Mobile Phone: [phone] Relation: Daughter  Code Status:  Full code  Goals of care: Advanced Directive information    08/05/2023    1:31 PM  Advanced Directives  Does Patient Have a Medical Advance Directive? Yes  Type of Estate agent of [place];Living will  Does patient want to make changes to medical advance directive? Yes (Inpatient - patient defers changing a medical advance directive at this time - Information given)  Copy of Healthcare Power of Attorney in Chart? No - copy requested     Chief Complaint  Patient presents with   Follow-up    Pt here for 6 month follow up. [facility] verified. Discuss covid, flu, AWV, zoster    HPI:  Pt is a 72 y.o. male seen today for 11-month follow-up for medical management of chronic diseases.    Seen by urologist today prior visit states PSA 5.0 MRI of prostate recommended.  Hypertension - states B/p in the 120's/80's - 130's/80's He denies any headache,dizziness,vision changes,fatigue,chest tightness,palpitation,chest pain or shortness of breath.     High hemoglobin - Hgb was 17.5 Donates blood every 10 weeks.  Recent Hgb 15 follows up with Oncologist Dr.Gudena Viinay.  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 If you have any questions, ask your nurse or doctor.          STOP taking these medications    Testosterone Powd       TAKE these medications    ascorbic acid 500 MG tablet Commonly known as: VITAMIN C Take 500 mg by mouth daily.   fluticasone 50 MCG/ACT nasal spray Commonly known as: FLONASE Place 2 sprays into both nostrils daily.   hydrochlorothiazide 25 MG tablet Commonly known as: HYDRODIURIL TAKE 1 TABLET (25 MG TOTAL) BY MOUTH DAILY.   lisinopril 20 MG tablet Commonly known as: ZESTRIL TAKE 1 TABLET BY MOUTH EVERY DAY   multivitamin tablet Take 1 tablet by mouth daily.   sildenafil 20 MG tablet Commonly known as: REVATIO Take 1 tablet (20 mg total) by mouth as needed.   silodosin 8 MG Caps capsule Commonly known as: RAPAFLO Take 1 capsule (8 mg total) by mouth daily with breakfast.   Testosterone Cypionate 200 MG/ML Soln Testosterone cypionate 100 mg /ml use  10% gel 4 clicks daily        Review of Systems  Constitutional:  Negative for appetite change, chills, fatigue, fever and unexpected weight change.  HENT:  Negative for congestion, dental problem, ear discharge, ear pain, facial swelling, hearing loss, nosebleeds, postnasal drip, rhinorrhea, sinus pressure, sinus pain, sneezing, sore throat, tinnitus and trouble swallowing.   Eyes:  Negative for pain, discharge, redness, itching and visual disturbance.  Respiratory:  Negative for cough, chest tightness, shortness of breath and wheezing.   Cardiovascular:   Negative for chest pain, palpitations and leg swelling.  Gastrointestinal:  Negative for abdominal distention, abdominal pain, blood in stool, constipation, diarrhea, nausea and vomiting.  Endocrine: Negative for cold intolerance, heat intolerance, polydipsia, polyphagia and polyuria.  Genitourinary:  Negative for difficulty urinating, dysuria, flank pain, frequency and urgency.  Musculoskeletal:  Negative for arthralgias, back pain, gait problem, joint swelling, myalgias, neck pain and neck stiffness.  Skin:  Negative for color change, pallor, rash and wound.  Neurological:  Negative for dizziness, syncope, speech difficulty, weakness, light-headedness, numbness and headaches.  Hematological:  Does not bruise/bleed easily.  Psychiatric/Behavioral:  Negative for agitation, behavioral problems, confusion, hallucinations, self-injury, sleep disturbance and suicidal ideas. The patient is not nervous/anxious.     Immunization History  Administered Date(s) Administered   Fluad Quad(high Dose 65+) 01/23/2022   Moderna Sars-Covid-2 Vaccination 12/11/2019, 01/08/2020   PNEUMOCOCCAL CONJUGATE-20 02/05/2023   Tdap 02/22/2023   Pertinent  Health Maintenance Due  Topic Date Due   INFLUENZA VACCINE  07/08/2023   Colonoscopy  10/18/2030      11/24/2022    8:00 AM 01/25/2023    8:50 AM 02/05/2023    8:55 AM 04/29/2023    3:12 PM 08/05/2023    1:32 PM  Fall Risk  Falls in the past year?  0 0 0 0  Was there an injury with Fall?  0 0 0   Fall Risk Category Calculator  0 0 0   (RETIRED) Patient Fall Risk Level High fall risk      Patient at Risk for Falls Due to  No Fall Risks No Fall Risks No Fall Risks   Fall risk Follow up  Falls evaluation completed Falls evaluation completed     Functional Status Survey:    Vitals:   08/05/23 1332  BP: 132/80  Pulse: 76  Temp: 98.1 F (36.7 C)  SpO2: 97%  Weight: 173 lb (78.5 kg)  Height: 5\' 8"  (1.727 m)   Body mass index is 26.3 kg/m. Physical  Exam Vitals reviewed.  Constitutional:      General: He is not in acute distress.    Appearance: Normal appearance. He is overweight. He is not ill-appearing or diaphoretic.  HENT:     Head: Normocephalic.     Right Ear: Tympanic membrane, ear canal and external ear normal. There is no impacted cerumen.     Left Ear: Tympanic membrane, ear canal and external ear normal. There is no impacted cerumen.     Nose: Nose normal. No congestion or rhinorrhea.     Mouth/Throat:     Mouth: Mucous membranes are moist.     Pharynx: Oropharynx is clear. No oropharyngeal exudate or posterior oropharyngeal erythema.  Eyes:     General: No scleral icterus.       Right eye: No discharge.        Left eye: No discharge.     Extraocular Movements: Extraocular movements intact.     Conjunctiva/sclera: Conjunctivae normal.  Pupils: Pupils are equal, round, and reactive to light.  Neck:     Vascular: No carotid bruit.  Cardiovascular:     Rate and Rhythm: Normal rate and regular rhythm.     Pulses: Normal pulses.     Heart sounds: Normal heart sounds. No murmur heard.    No friction rub. No gallop.  Pulmonary:     Effort: Pulmonary effort is normal. No respiratory distress.     Breath sounds: Normal breath sounds. No wheezing, rhonchi or rales.  Chest:     Chest wall: No tenderness.  Abdominal:     General: Bowel sounds are normal. There is no distension.     Palpations: Abdomen is soft. There is no mass.     Tenderness: There is no abdominal tenderness. There is no right CVA tenderness, left CVA tenderness, guarding or rebound.  Musculoskeletal:        General: No swelling or tenderness. Normal range of motion.     Cervical back: Normal range of motion. No rigidity or tenderness.     Right lower leg: No edema.     Left lower leg: No edema.  Lymphadenopathy:     Cervical: No cervical adenopathy.  Skin:    General: Skin is warm and dry.     Coloration: Skin is not pale.     Findings: No  bruising, erythema, lesion or rash.  Neurological:     Mental Status: He is alert and oriented to person, place, and time.     Cranial Nerves: No cranial nerve deficit.     Sensory: No sensory deficit.     Motor: No weakness.     Coordination: Coordination normal.     Gait: Gait normal.  Psychiatric:        Mood and Affect: Mood normal.        Speech: Speech normal.        Behavior: Behavior normal.        Thought Content: Thought content normal.        Judgment: Judgment normal.     Labs reviewed: Recent Labs    11/24/22 0410 02/05/23 1002 08/05/23 1432  NA 132* 133* 131*  K 3.8 3.8 3.5  CL 102 96* 96*  CO2 23 28 26   GLUCOSE 162* 83 85  BUN 15 11 12   CREATININE 1.02 0.88 0.89  CALCIUM 8.7* 9.8 9.5   Recent Labs    02/05/23 1002 08/05/23 1432  AST 21 18  ALT 18 19  BILITOT 1.1 2.0*  PROT 6.9 6.7   Recent Labs    02/05/23 1002 06/18/23 0737 08/05/23 1432  WBC 5.6 6.6 6.1  NEUTROABS 3,629 4.6 3,666  HGB 15.5 15.2 16.4  HCT 50.5* 47.3 53.0*  MCV 74.2* 71.5* 73.1*  PLT 210 170 185   Lab Results  Component Value Date   TSH 1.40 08/05/2023   No results found for: "HGBA1C" Lab Results  Component Value Date   CHOL 137 08/05/2023   HDL 45 08/05/2023   LDLCALC 74 08/05/2023   TRIG 92 08/05/2023   CHOLHDL 3.0 08/05/2023    Significant Diagnostic Results in last 30 days:  No results found.  Assessment/Plan 1. Essential hypertension Blood pressure well-controlled -Continue on lisinopril and hydrochlorothiazide -Dietary modification and exercise advised.  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 - TSH - COMPLETE METABOLIC PANEL WITH GFR - CBC with Differential/Platelet  2. Hyperlipidemia LDL goal <100 Previous LDL at goal -Continue with  dietary modification - Lipid panel  3. Benign prostatic hyperplasia without lower urinary tract symptoms -Prostate MRI recommended by urologist has upcoming appointment to screen for  prostate cancer PSA 5 -Continue on Rapaflo  4. Testosterone deficiency Continue to monitor testosterone level previous level low 223 Will repeat level with next lab work. -Continue on testosterone  5. Secondary erythrocytosis Follows up with oncologist Dr. Dalbert Mayotte thought due to testosterone replacement.  Continue with phlebotomies donates blood at the [facility] which has maintained latest hemoglobin 15.5  Family/ staff Communication: Reviewed plan of care with patient verbalized understanding  Labs/tests ordered:  - TSH - COMPLETE METABOLIC PANEL WITH GFR - CBC with Differential/Platelet - Lipid panel  Next Appointment : Return in about 6 months (around 02/04/2024) for medical mangement of chronic issues.Caesar Bookman, NP

## 2023-08-06 LAB — CBC WITH DIFFERENTIAL/PLATELET
Absolute Monocytes: 525 {cells}/uL (ref 200–950)
Basophils Absolute: 43 {cells}/uL (ref 0–200)
Basophils Relative: 0.7 %
Eosinophils Absolute: 110 {cells}/uL (ref 15–500)
Eosinophils Relative: 1.8 %
HCT: 53 % — ABNORMAL HIGH (ref 38.5–50.0)
Hemoglobin: 16.4 g/dL (ref 13.2–17.1)
Lymphs Abs: 1757 {cells}/uL (ref 850–3900)
MCH: 22.6 pg — ABNORMAL LOW (ref 27.0–33.0)
MCHC: 30.9 g/dL — ABNORMAL LOW (ref 32.0–36.0)
MCV: 73.1 fL — ABNORMAL LOW (ref 80.0–100.0)
Monocytes Relative: 8.6 %
Neutro Abs: 3666 {cells}/uL (ref 1500–7800)
Neutrophils Relative %: 60.1 %
Platelets: 185 10*3/uL (ref 140–400)
RBC: 7.25 10*6/uL — ABNORMAL HIGH (ref 4.20–5.80)
RDW: 19.3 % — ABNORMAL HIGH (ref 11.0–15.0)
Total Lymphocyte: 28.8 %
WBC: 6.1 10*3/uL (ref 3.8–10.8)

## 2023-08-06 LAB — COMPLETE METABOLIC PANEL WITH GFR
AG Ratio: 1.8 (calc) (ref 1.0–2.5)
ALT: 19 U/L (ref 9–46)
AST: 18 U/L (ref 10–35)
Albumin: 4.3 g/dL (ref 3.6–5.1)
Alkaline phosphatase (APISO): 43 U/L (ref 35–144)
BUN: 12 mg/dL (ref 7–25)
CO2: 26 mmol/L (ref 20–32)
Calcium: 9.5 mg/dL (ref 8.6–10.3)
Chloride: 96 mmol/L — ABNORMAL LOW (ref 98–110)
Creat: 0.89 mg/dL (ref 0.70–1.28)
Globulin: 2.4 g/dL (ref 1.9–3.7)
Glucose, Bld: 85 mg/dL (ref 65–139)
Potassium: 3.5 mmol/L (ref 3.5–5.3)
Sodium: 131 mmol/L — ABNORMAL LOW (ref 135–146)
Total Bilirubin: 2 mg/dL — ABNORMAL HIGH (ref 0.2–1.2)
Total Protein: 6.7 g/dL (ref 6.1–8.1)
eGFR: 92 mL/min/{1.73_m2} (ref >=60)

## 2023-08-06 LAB — TSH: TSH: 1.4 mIU/L (ref 0.40–4.50)

## 2023-08-06 LAB — LIPID PANEL
Cholesterol: 137 mg/dL (ref <200)
HDL: 45 mg/dL (ref >=40)
LDL Cholesterol (Calc): 74 mg/dL
Non-HDL Cholesterol (Calc): 92 mg/dL (ref <130)
Total CHOL/HDL Ratio: 3 (calc) (ref <5.0)
Triglycerides: 92 mg/dL (ref <150)

## 2023-08-10 ENCOUNTER — Encounter: Payer: Self-pay | Admitting: Family

## 2023-08-13 ENCOUNTER — Other Ambulatory Visit: Payer: Self-pay | Admitting: Urology

## 2023-08-13 ENCOUNTER — Ambulatory Visit: Payer: Medicare HMO | Admitting: Family

## 2023-08-13 DIAGNOSIS — R972 Elevated prostate specific antigen [PSA]: Secondary | ICD-10-CM

## 2023-08-18 NOTE — Progress Notes (Signed)
Yes, okay with me.It looks like he has had hyponatremia for many years.  Thanks MJP

## 2023-08-20 ENCOUNTER — Telehealth: Payer: Medicare HMO

## 2023-08-20 NOTE — Telephone Encounter (Signed)
-----   Message from Donalee Citrin Ngetich sent at 08/18/2023  2:42 PM EDT ----- Labs are normal except sodium level is slightly lower than previous level suspect due to hydrochlorothiazide.will consult with Cardiology Dr. Truett Mainland if okay to reduce dose from 25 mg tablet to 12.5 mg tablet then recheck BMP in 4 weeks.  Hemoglobin level still high.continue with Phlebotomy.

## 2023-09-20 ENCOUNTER — Encounter: Payer: Self-pay | Admitting: Family

## 2023-09-20 ENCOUNTER — Ambulatory Visit: Payer: Medicare HMO | Admitting: Family

## 2023-09-20 VITALS — BP 122/84 | HR 85 | Temp 98.4°F | Ht 68.0 in | Wt 175.0 lb

## 2023-09-20 DIAGNOSIS — Z23 Encounter for immunization: Secondary | ICD-10-CM | POA: Diagnosis not present

## 2023-09-20 DIAGNOSIS — E785 Hyperlipidemia, unspecified: Secondary | ICD-10-CM | POA: Diagnosis not present

## 2023-09-20 DIAGNOSIS — I10 Essential (primary) hypertension: Secondary | ICD-10-CM | POA: Diagnosis not present

## 2023-09-20 NOTE — Progress Notes (Signed)
Provider: Richarda Blade FNP-C   Damaria Vachon, Donalee Citrin, NP  Patient Care Team: Walterine Amodei, Donalee Citrin, NP as PCP - General (Family Medicine) Serena Croissant, MD as Consulting Physician (Hematology and Oncology)  Extended Emergency Contact Information Primary Emergency Contact: Leugers,[name] Home Phone: [phone] Mobile Phone: [phone] Relation: Spouse Secondary Emergency Contact: [name],[name] Mobile Phone: [phone] Relation: Daughter  Code Status:  Full Code  Goals of care: Advanced Directive information    08/05/2023    1:31 PM  Advanced Directives  Does Patient Have a Medical Advance Directive? Yes  Type of Estate agent of [place];Living will  Does patient want to make changes to medical advance directive? Yes (Inpatient - patient defers changing a medical advance directive at this time - Information given)  Copy of Healthcare Power of Attorney in Chart? No - copy requested     Chief Complaint  Patient presents with   Medical Management of Chronic Issues    Patient presents today for HTN recheck.    HPI:  Pt is a 72 y.o. male seen today for medical management of chronic diseases. He denies any acute issues.  BPH - follows up with urologist.MRI recommended.No urine obstruction.   Hypertension - checks B/p at work.He denies any headache,dizziness,vision changes,fatigue,chest tightness,palpitation,chest pain or shortness of breath.      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 If you have any questions, ask your nurse or doctor.          ascorbic acid 500 MG tablet Commonly known as: VITAMIN C Take 500 mg by mouth daily.   fluticasone 50 MCG/ACT nasal spray Commonly known as: FLONASE Place 2 sprays into both nostrils daily.   hydrochlorothiazide 25 MG tablet Commonly known as: HYDRODIURIL TAKE 1 TABLET (25 MG TOTAL) BY MOUTH DAILY.   lisinopril 20 MG tablet Commonly known as: ZESTRIL TAKE 1 TABLET BY MOUTH EVERY DAY   multivitamin tablet Take 1 tablet by mouth daily.   sildenafil 20 MG tablet Commonly known as: REVATIO Take 1 tablet (20 mg total) by mouth as needed.   silodosin 8 MG Caps capsule Commonly known as: RAPAFLO Take 1 capsule (8 mg total) by mouth daily with breakfast.   Testosterone Cypionate 200 MG/ML Soln Testosterone cypionate 100 mg /ml use 10% gel 4 clicks daily        Review of Systems  Constitutional:  Negative for appetite change, chills, fatigue, fever and unexpected weight change.  HENT:  Negative for congestion, dental problem, ear discharge, ear pain, facial swelling, hearing loss, nosebleeds, postnasal drip, rhinorrhea, sinus pressure, sinus pain, sneezing, sore throat, tinnitus and trouble swallowing.   Eyes:  Negative for pain, discharge, redness, itching  and visual disturbance.  Respiratory:  Negative for cough, chest tightness, shortness of breath and wheezing.   Cardiovascular:  Negative for chest pain, palpitations and leg swelling.  Gastrointestinal:  Negative for abdominal distention, abdominal pain, blood in stool, constipation, diarrhea, nausea and vomiting.  Endocrine: Negative for cold intolerance, heat intolerance, polydipsia, polyphagia and polyuria.  Genitourinary:  Negative for difficulty urinating, dysuria, flank pain,  frequency and urgency.  Musculoskeletal:  Negative for arthralgias, back pain, gait problem, joint swelling, myalgias, neck pain and neck stiffness.  Skin:  Negative for color change, pallor, rash and wound.  Neurological:  Negative for dizziness, syncope, speech difficulty, weakness, light-headedness, numbness and headaches.  Hematological:  Does not bruise/bleed easily.  Psychiatric/Behavioral:  Negative for agitation, behavioral problems, confusion, hallucinations and sleep disturbance. The patient is not nervous/anxious.     Immunization History  Administered Date(s) Administered   Fluad Quad(high Dose 65+) 01/23/2022   Fluad Trivalent(High Dose 65+) 09/20/2023   Moderna Sars-Covid-2 Vaccination 12/11/2019, 01/08/2020   PNEUMOCOCCAL CONJUGATE-20 02/05/2023   Tdap 02/22/2023   Pertinent  Health Maintenance Due  Topic Date Due   Colonoscopy  10/18/2030   INFLUENZA VACCINE  Completed      11/24/2022    8:00 AM 01/25/2023    8:50 AM 02/05/2023    8:55 AM 04/29/2023    3:12 PM 08/05/2023    1:32 PM  Fall Risk  Falls in the past year?  0 0 0 0  Was there an injury with Fall?  0 0 0   Fall Risk Category Calculator  0 0 0   (RETIRED) Patient Fall Risk Level High fall risk      Patient at Risk for Falls Due to  No Fall Risks No Fall Risks No Fall Risks   Fall risk Follow up  Falls evaluation completed Falls evaluation completed     Functional Status Survey:    Vitals:   09/20/23 0953  BP: 122/84  Pulse: 85  Temp: 98.4 F (36.9 C)  SpO2: 97%  Weight: 175 lb (79.4 kg)  Height: 5\' 8"  (1.727 m)   Body mass index is 26.61 kg/m. Physical Exam Vitals reviewed.  Constitutional:      General: He is not in acute distress.    Appearance: Normal appearance. He is overweight. He is not ill-appearing or diaphoretic.  HENT:     Head: Normocephalic.     Right Ear: Tympanic membrane, ear canal and external ear normal. There is no impacted cerumen.     Left Ear: Tympanic membrane, ear  canal and external ear normal. There is no impacted cerumen.     Nose: Nose normal. No congestion or rhinorrhea.     Mouth/Throat:     Mouth: Mucous membranes are moist.     Pharynx: Oropharynx is clear. No oropharyngeal exudate or posterior oropharyngeal erythema.  Eyes:     General: No scleral icterus.       Right eye: No discharge.        Left eye: No discharge.     Extraocular Movements: Extraocular movements intact.     Conjunctiva/sclera: Conjunctivae normal.     Pupils: Pupils are equal, round, and reactive to light.  Neck:     Vascular: No carotid bruit.  Cardiovascular:     Rate and Rhythm: Normal rate and regular rhythm.     Pulses: Normal pulses.     Heart sounds: Normal heart sounds. No murmur heard.    No friction rub. No gallop.  Pulmonary:  Effort: Pulmonary effort is normal. No respiratory distress.     Breath sounds: Normal breath sounds. No wheezing, rhonchi or rales.  Chest:     Chest wall: No tenderness.  Abdominal:     General: Bowel sounds are normal. There is no distension.     Palpations: Abdomen is soft. There is no mass.     Tenderness: There is no abdominal tenderness. There is no right CVA tenderness, left CVA tenderness, guarding or rebound.  Musculoskeletal:        General: No swelling or tenderness. Normal range of motion.     Cervical back: Normal range of motion. No rigidity or tenderness.     Right lower leg: No edema.     Left lower leg: No edema.  Lymphadenopathy:     Cervical: No cervical adenopathy.  Skin:    General: Skin is warm and dry.     Coloration: Skin is not pale.     Findings: No bruising, erythema, lesion or rash.  Neurological:     Mental Status: He is alert and oriented to person, place, and time.     Cranial Nerves: No cranial nerve deficit.     Sensory: No sensory deficit.     Motor: No weakness.     Coordination: Coordination normal.     Gait: Gait normal.  Psychiatric:        Mood and Affect: Mood normal.         Speech: Speech normal.        Behavior: Behavior normal.        Thought Content: Thought content normal.        Judgment: Judgment normal.     Labs reviewed: Recent Labs    11/24/22 0410 02/05/23 1002 08/05/23 1432  NA 132* 133* 131*  K 3.8 3.8 3.5  CL 102 96* 96*  CO2 23 28 26   GLUCOSE 162* 83 85  BUN 15 11 12   CREATININE 1.02 0.88 0.89  CALCIUM 8.7* 9.8 9.5   Recent Labs    02/05/23 1002 08/05/23 1432  AST 21 18  ALT 18 19  BILITOT 1.1 2.0*  PROT 6.9 6.7   Recent Labs    02/05/23 1002 06/18/23 0737 08/05/23 1432  WBC 5.6 6.6 6.1  NEUTROABS 3,629 4.6 3,666  HGB 15.5 15.2 16.4  HCT 50.5* 47.3 53.0*  MCV 74.2* 71.5* 73.1*  PLT 210 170 185   Lab Results  Component Value Date   TSH 1.40 08/05/2023   No results found for: "HGBA1C" Lab Results  Component Value Date   CHOL 137 08/05/2023   HDL 45 08/05/2023   LDLCALC 74 08/05/2023   TRIG 92 08/05/2023   CHOLHDL 3.0 08/05/2023    Significant Diagnostic Results in last 30 days:  No results found.  Assessment/Plan 1. Need for influenza vaccination Afebrile  Flu shot administered by CMA no acute reaction reported.  - Flu Vaccine Trivalent High Dose (Fluad)  2. Essential hypertension Blood pressure well-controlled -Continue on lisinopril and hydrochlorothiazide -Continue to monitor blood pressure at home and notify provider if greater than 140/90 -Dietary modification and exercise advised - TSH; Future - COMPLETE METABOLIC PANEL WITH GFR; Future - CBC with Differential/Platelet; Future  3. Hyperlipidemia LDL goal <100 LDL at goal -Continue with dietary modification and exercise - Lipid panel; Future  Family/ staff Communication: Reviewed plan of care with patient verbalized understanding  Labs/tests ordered:  - TSH; Future - COMPLETE METABOLIC PANEL WITH GFR; Future - CBC with Differential/Platelet; Future -  Lipid panel; Future  Next Appointment : Return in about 6 months (around 03/20/2024)  for medical mangement of chronic issues., Fasting labs in 6 months prior to visit.   Caesar Bookman, NP

## 2023-09-30 ENCOUNTER — Ambulatory Visit
Admission: RE | Admit: 2023-09-30 | Discharge: 2023-09-30 | Disposition: A | Discharge status: Home / Self Care | Payer: Medicare HMO | Source: Ambulatory Visit | Attending: Urology | Admitting: Urology

## 2023-09-30 DIAGNOSIS — R972 Elevated prostate specific antigen [PSA]: Secondary | ICD-10-CM

## 2023-09-30 MED ORDER — GADOPICLENOL 0.5 MMOL/ML IV SOLN
7.5000 mL | Freq: Once | INTRAVENOUS | Status: AC | PRN
  Administered 2023-09-30: 7.5 mL via INTRAVENOUS

## 2023-11-07 DIAGNOSIS — C61 Malignant neoplasm of prostate: Secondary | ICD-10-CM

## 2023-11-07 HISTORY — DX: Malignant neoplasm of prostate: C61

## 2023-11-25 ENCOUNTER — Other Ambulatory Visit: Payer: Self-pay

## 2023-11-25 DIAGNOSIS — E349 Endocrine disorder, unspecified: Secondary | ICD-10-CM

## 2023-11-25 MED ORDER — TESTOSTERONE CYPIONATE 200 MG/ML IJ SOLN
INTRAMUSCULAR | 1 refills | Status: DC
Start: 2023-11-25 — End: 2023-11-25

## 2023-11-25 MED ORDER — TESTOSTERONE CYPIONATE 200 MG/ML IJ SOLN: INTRAMUSCULAR | 0 refills | Status: DC

## 2023-11-25 NOTE — Telephone Encounter (Signed)
Patient is requesting a refill of the following medications: Requested Prescriptions   Pending Prescriptions Disp Refills   Testosterone Cypionate 200 MG/ML SOLN 60 mL 1    Sig: Testosterone cypionate 100 mg /ml use 10% gel 4 clicks daily    Date of last refill:  Refill amount:   Treatment agreement date:

## 2023-11-26 ENCOUNTER — Other Ambulatory Visit: Payer: Self-pay

## 2023-11-26 DIAGNOSIS — E349 Endocrine disorder, unspecified: Secondary | ICD-10-CM

## 2023-11-26 MED ORDER — TESTOSTERONE CYPIONATE 200 MG/ML IJ SOLN: INTRAMUSCULAR | 0 refills | Status: DC

## 2023-11-26 NOTE — Telephone Encounter (Signed)
Medication was sent to wrong pharmacy. Correct pharmacy is listed "[facility]". Please refill prescription. Message routed to Ella Bodo, NP due to PCP Ngetich, Donalee Citrin, NP being out of office.

## 2023-12-21 ENCOUNTER — Telehealth: Payer: Self-pay | Admitting: Radiation Oncology

## 2023-12-21 NOTE — Telephone Encounter (Signed)
 Left message for patient to call back to schedule consult per 11/4 referral.

## 2023-12-22 NOTE — Progress Notes (Signed)
GU Location of Tumor / Histology: Prostate Ca  If Prostate Cancer, Gleason Score is (3 + 4) and PSA is (5.26 on 07/2023)  Biopsies     09/30/2023 Dr. Jettie Pagan MR Prostate with/without Contrast CLINICAL DATA: Elevated PSA level of 5.26. R97.20   IMPRESSION: 1. Three PI-RADS category 4 lesions are present in the peripheral zone. Targeting data sent to [facility]. 2. Prostatomegaly and benign prostatic hypertrophy.    Past/Anticipated interventions by urology, if any:     Past/Anticipated interventions by medical oncology, if any: NA  Weight changes, if any: No, maintaining weight .  IPSS:  8 SHIM:  21  Bowel/Bladder complaints, if any:  No  Nausea/Vomiting, if any: No  Pain issues, if any:  0/10  SAFETY ISSUES: Prior radiation?  No Pacemaker/ICD? No Possible current pregnancy? Male Is the patient on methotrexate? No  Current Complaints / other details:

## 2023-12-30 ENCOUNTER — Ambulatory Visit
Admission: RE | Admit: 2023-12-30 | Discharge: 2023-12-30 | Disposition: A | Discharge status: Home / Self Care | Payer: Medicare HMO | Source: Ambulatory Visit | Attending: Radiation Oncology | Admitting: Radiation Oncology

## 2023-12-30 ENCOUNTER — Encounter: Payer: Self-pay | Admitting: Radiation Oncology

## 2023-12-30 VITALS — BP 148/89 | HR 76 | Temp 98.1°F | Resp 20 | Ht 68.0 in | Wt 172.6 lb

## 2023-12-30 DIAGNOSIS — M129 Arthropathy, unspecified: Secondary | ICD-10-CM | POA: Insufficient documentation

## 2023-12-30 DIAGNOSIS — Z7982 Long term (current) use of aspirin: Secondary | ICD-10-CM | POA: Insufficient documentation

## 2023-12-30 DIAGNOSIS — N529 Male erectile dysfunction, unspecified: Secondary | ICD-10-CM | POA: Insufficient documentation

## 2023-12-30 DIAGNOSIS — I10 Essential (primary) hypertension: Secondary | ICD-10-CM | POA: Diagnosis not present

## 2023-12-30 DIAGNOSIS — Z79899 Other long term (current) drug therapy: Secondary | ICD-10-CM | POA: Insufficient documentation

## 2023-12-30 DIAGNOSIS — Z923 Personal history of irradiation: Secondary | ICD-10-CM | POA: Insufficient documentation

## 2023-12-30 DIAGNOSIS — Z8616 Personal history of COVID-19: Secondary | ICD-10-CM | POA: Insufficient documentation

## 2023-12-30 DIAGNOSIS — C61 Malignant neoplasm of prostate: Secondary | ICD-10-CM | POA: Insufficient documentation

## 2023-12-30 NOTE — Progress Notes (Signed)
Introduced myself to the patient, and his wife, as the prostate nurse navigator.  No barriers to care identified at this time.  He is here to discuss his radiation treatment options and will proceed with active surveillance for now. Patient is schedule to follow up with urology on 01/20/24.  I gave him my business card and asked him to call me with questions or concerns.  Verbalized understanding.

## 2023-12-30 NOTE — Progress Notes (Signed)
Radiation Oncology         [phone] ________________________________  Initial Outpatient Consultation  Name: ELLIS [name] MRN: [id number]  Date: 12/30/2023  DOB: 24-Oct-1951  NU:[id number], Donalee Citrin, NP  Jannifer Hick, MD   REFERRING PHYSICIAN: Jannifer Hick, MD  DIAGNOSIS: 73 y.o. gentleman with Stage T1c adenocarcinoma of the prostate with Gleason score of 3+4, and PSA of 5.26.    ICD-10-CM   1. Malignant neoplasm of prostate (HCC)  C61       HISTORY OF PRESENT ILLNESS: Sergio [name] is a 73 y.o. male with a diagnosis of prostate cancer. He is an established urology patient, previously followed by Dr. Benancio Deeds since at least December 2021, for BPH with BOO, hypogonadism and erectile dysfunction.  He did note some improvement in his LUTS when his medication was changed from Flomax to Rapaflo for the BPH.  He has continued TRT with his PCP and Scilla dose and for ED.  On his initial visit, the digital rectal exam was.  His PSA and testosterone levels were being monitored with his PCP and apparently had been elevated at 7.36 in August 2022 so he was referred back to urology for evaluation of the elevated PSA on 07/17/2022.  A repeat PSA at that time remained elevated at 5.41 and DRE was without concerning findings so the patient elected to continue to monitor the PSA.  A repeat PSA in March 2024 remained stable elevated at 5.38.  He had a follow-up visit with Dr. Cardell Peach on 07/22/2023 and PSA remained elevated at 5.26.  An MRI of the prostate was recommended for further evaluation and this was performed on 09/30/2023 showing 3 PI-RADS 4 lesions in the peripheral zone with 2 on the left and 1 on the right.  The patient proceeded to MRI fusion transrectal ultrasound with 21 biopsies of the prostate on 12/07/2023.  The prostate volume measured 101 cc.  Out of 21 core biopsies, 2 were positive.  The maximum Gleason score was 3+4, and this was seen in the left base.  Additionally, Gleason 3+3 was seen  in the left base lateral but all 9 cores from the MRI ROI's were negative.  The patient reviewed the biopsy results with his urologist and he has kindly been referred today for discussion of potential radiation treatment options.   PREVIOUS RADIATION THERAPY: No  PAST MEDICAL HISTORY:  Past Medical History:  Diagnosis Date   Arthritis    Enlarged prostate    Erectile dysfunction    History of colonoscopy    History of COVID-19    Hypertension    Low testosterone    Low testosterone    Pneumonia    2022      PAST SURGICAL HISTORY: Past Surgical History:  Procedure Laterality Date   COLONOSCOPY     RETINAL DETACHMENT SURGERY Right    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   Procedure: TOTAL KNEE ARTHROPLASTY;  Surgeon: Joen Laura, MD;  Location: [facility];  Service: Orthopedics;  Laterality: Right;   WISDOM TOOTH EXTRACTION      FAMILY HISTORY:  Family History  Problem Relation Age of Onset   Heart failure Mother    Heart failure Father    Arthritis Sister    Arthritis Sister    Arthritis Son     SOCIAL HISTORY:  Social History   Socioeconomic History   Marital status: Married    Spouse name: [name]   Number of children: 2   Years of  education: Not on file   Highest education level: Associate degree: occupational, Scientist, product/process development, or vocational program  Occupational History   Not on file  Tobacco Use   Smoking status: Never   Smokeless tobacco: Never  Vaping Use   Vaping status: Never Used  Substance and Sexual Activity   Alcohol use: Never   Drug use: Never   Sexual activity: Yes  Other Topics Concern   Not on file  Social History Narrative   Tobacco use, amount per day now:   Past tobacco use, amount per day:   How many years did you use tobacco:   Alcohol use (drinks per week):   Diet:   Do you drink/eat things with caffeine: Yes   Marital status:  Married                                What year were you married? 1974   Do you live in a  house, apartment, assisted living, condo, trailer, etc.? House   Is it one or more stories? 1   How many persons live in your home? 2   Do you have pets in your home?( please list) No   Highest Level of education completed? 14   Current or past profession: Retired [employer] you exercise?   No                               Type and how often?   Do you have a living will? Yes   Do you have a DNR form?                                   If not, do you want to discuss one? No   Do you have signed POA/HPOA forms?   Yes                     If so, please bring to you appointment      Do you have any difficulty bathing or dressing yourself? No   Do you have any difficulty preparing food or eating? No   Do you have any difficulty managing your medications? No   Do you have any difficulty managing your finances? No   Do you have any difficulty affording your medications? No   Social Drivers of Corporate investment banker Strain: Low Risk  (09/16/2023)   Overall Financial Resource Strain (CARDIA)    Difficulty of Paying Living Expenses: Not hard at all  Food Insecurity: No Food Insecurity (09/16/2023)   Hunger Vital Sign    Worried About Running Out of Food in the Last Year: Never true    Ran Out of Food in the Last Year: Never true  Transportation Needs: No Transportation Needs (09/16/2023)   PRAPARE - Administrator, Civil Service (Medical): No    Lack of Transportation (Non-Medical): No  Physical Activity: Unknown (09/16/2023)   Exercise Vital Sign    Days of Exercise per Week: 5 days    Minutes of Exercise per Session: Patient declined  Stress: No Stress Concern Present (09/16/2023)   [employer] of Occupational Health - Occupational Stress Questionnaire    Feeling of Stress : Not at all  Social Connections: Socially Integrated (09/16/2023)   Social Connection  and Isolation Panel [NHANES]    Frequency of Communication with Friends and Family: More than three  times a week    Frequency of Social Gatherings with Friends and Family: Three times a week    Attends Religious Services: More than 4 times per year    Active Member of Clubs or Organizations: Yes    Attends Banker Meetings: 1 to 4 times per year    Marital Status: Married  Catering manager Violence: Not At Risk (11/23/2022)   Humiliation, Afraid, Rape, and Kick questionnaire    Fear of Current or Ex-Partner: No    Emotionally Abused: No    Physically Abused: No    Sexually Abused: No    ALLERGIES: Patient has no known allergies.  MEDICATIONS:  Current Outpatient Medications  Medication Sig Dispense Refill   ascorbic acid (VITAMIN C) 500 MG tablet Take 500 mg by mouth daily.     aspirin EC 81 MG tablet Take 81 mg by mouth daily. Swallow whole.     Cholecalciferol (VITAMIN D3) 10 MCG (400 UNIT) CAPS Take 400 Units by mouth daily.     hydrochlorothiazide (HYDRODIURIL) 25 MG tablet TAKE 1 TABLET (25 MG TOTAL) BY MOUTH DAILY. 90 tablet 2   lisinopril (ZESTRIL) 20 MG tablet TAKE 1 TABLET BY MOUTH EVERY DAY 90 tablet 1   Multiple Vitamin (MULTIVITAMIN) tablet Take 1 tablet by mouth daily.     sildenafil (REVATIO) 20 MG tablet Take 1 tablet (20 mg total) by mouth as needed. 10 tablet 0   silodosin (RAPAFLO) 8 MG CAPS capsule Take 1 capsule (8 mg total) by mouth daily with breakfast. 30 capsule    Testosterone Cypionate 200 MG/ML SOLN Testosterone cypionate 100 mg /ml use 10% gel 4 clicks daily 60 mL 0   fluticasone (FLONASE) 50 MCG/ACT nasal spray Place 2 sprays into both nostrils daily. (Patient not taking: Reported on 12/30/2023) 16 g 6   No current facility-administered medications for this encounter.    REVIEW OF SYSTEMS:  On review of systems, the patient reports that he is doing well overall. He denies any chest pain, shortness of breath, cough, fevers, chills, night sweats, unintended weight changes. He denies any bowel disturbances, and denies abdominal pain, nausea or  vomiting. He denies any new musculoskeletal or joint aches or pains. His IPSS was 8, indicating mild-moderate urinary symptoms that are tolerable with Rapaflo daily. His SHIM was 21, indicating he does not have erectile dysfunction. A complete review of systems is obtained and is otherwise negative.    PHYSICAL EXAM:  Wt Readings from Last 3 Encounters:  12/30/23 172 lb 9.6 oz (78.3 kg)  09/20/23 175 lb (79.4 kg)  08/05/23 173 lb (78.5 kg)   Temp Readings from Last 3 Encounters:  12/30/23 98.1 F (36.7 C) (Oral)  09/20/23 98.4 F (36.9 C)  08/05/23 98.1 F (36.7 C)   BP Readings from Last 3 Encounters:  09/20/23 122/84  08/05/23 132/80  07/22/23 126/86   Pulse Readings from Last 3 Encounters:  12/30/23 76  09/20/23 85  08/05/23 76   Pain Assessment Pain Score: 0-No pain/10  In general this is a well appearing Caucasian male in no acute distress. He's alert and oriented x4 and appropriate throughout the examination. Cardiopulmonary assessment is negative for acute distress, and he exhibits normal effort.     KPS = 100  100 - Normal; no complaints; no evidence of disease. 90   - Able to carry on normal activity; minor signs  or symptoms of disease. 80   - Normal activity with effort; some signs or symptoms of disease. 39   - Cares for self; unable to carry on normal activity or to do active work. 60   - Requires occasional assistance, but is able to care for most of his personal needs. 50   - Requires considerable assistance and frequent medical care. 40   - Disabled; requires special care and assistance. 30   - Severely disabled; hospital admission is indicated although death not imminent. 20   - Very sick; hospital admission necessary; active supportive treatment necessary. 10   - Moribund; fatal processes progressing rapidly. 0     - Dead  Karnofsky DA, Abelmann WH, Craver LS and Burchenal [facility] [phone]) The use of the nitrogen mustards in the palliative treatment of  carcinoma: with particular reference to bronchogenic carcinoma Cancer 1 [phone]  LABORATORY DATA:  Lab Results  Component Value Date   WBC 6.1 08/05/2023   HGB 16.4 08/05/2023   HCT 53.0 (H) 08/05/2023   MCV 73.1 (L) 08/05/2023   PLT 185 08/05/2023   Lab Results  Component Value Date   NA 131 (L) 08/05/2023   K 3.5 08/05/2023   CL 96 (L) 08/05/2023   CO2 26 08/05/2023   Lab Results  Component Value Date   ALT 19 08/05/2023   AST 18 08/05/2023   ALKPHOS 52 10/24/2018   BILITOT 2.0 (H) 08/05/2023     RADIOGRAPHY: No results found.    IMPRESSION/PLAN: 1. 73 y.o. gentleman with Stage T1c adenocarcinoma of the prostate with Gleason Score of 3+4, and PSA of 5.26. We discussed the patient's workup and outlined the nature of prostate cancer in this setting. The patient's T stage, Gleason's score, and PSA put him into the favorable intermediate risk group. Accordingly, he is eligible for a variety of potential treatment options including active surveillance, brachytherapy, 5.5 weeks of external radiation, or prostatectomy. We discussed the available radiation techniques, and focused on the details and logistics of delivery. The patient is not a candidate for brachytherapy with a prostate volume of 101 cc. At this volume, even with ADT +/- 5-ARI, we would not be able to downsize enough for a safe implant. Therefore, we discussed and outlined the risks, benefits, short and long-term effects associated with daily external beam radiotherapy and compared and contrasted these with prostatectomy. We discussed the role of SpaceOAR gel in reducing the rectal toxicity associated with radiotherapy. He appears to have a good understanding of his disease and our treatment recommendations which are of curative intent.  He was encouraged to ask questions that were answered to his stated satisfaction.  At the conclusion of our conversation, the patient was initially interested in moving forward with active  surveillance, but after further conversation with his wife and daughter, he has decided to proceed with 5.5 weeks of daily external beam radiation since his daughter has offered to help care for his wife during her upcoming cataract surgery in March 2025.  We will share our discussion with Dr. Cardell Peach and coordinate for fiducial markers and SpaceOAR gel placement, first available, prior to CT simulation, in anticipation of beginning his daily treatments in the near future. We enjoyed meeting him and his wife today and look forward to continuing to participate in his care.  They know that they are welcome to call at anytime with any questions or concerns related to our discussion today.  We personally spent 70 minutes in this encounter including chart  review, reviewing radiological studies, meeting face-to-face with the patient, entering orders and completing documentation.    Marguarite Arbour, PA-C    Margaretmary Dys, MD  [facility]  Radiation Oncology Direct Dial: [phone]  Fax: [phone] [email or web address]  Skype  LinkedIn

## 2023-12-31 NOTE — Progress Notes (Signed)
Patient has decided to proceed with 5.5 weeks of daily radiation.  RN provided education on next steps with fiducial marker's, spaceOAR, and CT Simulation.   MD's notified, plan of care in progress.

## 2024-01-02 ENCOUNTER — Ambulatory Visit
Admission: EM | Admit: 2024-01-02 | Discharge: 2024-01-02 | Disposition: A | Discharge status: Home / Self Care | Payer: Medicare HMO | Attending: Emergency Medicine | Admitting: Emergency Medicine

## 2024-01-02 DIAGNOSIS — J069 Acute upper respiratory infection, unspecified: Secondary | ICD-10-CM

## 2024-01-02 MED ORDER — AZITHROMYCIN 250 MG PO TABS: 250.0000 mg | ORAL_TABLET | Freq: Every day | ORAL | 0 refills | Status: DC

## 2024-01-02 NOTE — ED Triage Notes (Signed)
Patient to Urgent Care with complaints of nasal congestion/ runny nose. Denies any known fevers. Post nasal drip.  Symptoms started one week ago.  Taking Nyquil/ using cough drops.

## 2024-01-02 NOTE — ED Provider Notes (Signed)
Sergio [name]    CSN: [id number] Arrival date & time: 01/02/24  2956      History   Chief Complaint Chief Complaint  Patient presents with   Nasal Congestion    HPI Sergio [name] is a 73 y.o. male.  Patient presents with 1 week history of congestion, postnasal drip, runny nose, cough.  His nasal mucus is yellow.  No fever or shortness of breath.  No OTC medications taken today; previously treating with NyQuil.  The history is provided by the patient and medical records.    Past Medical History:  Diagnosis Date   Arthritis    Enlarged prostate    Erectile dysfunction    History of colonoscopy    History of COVID-19    Hypertension    Low testosterone    Low testosterone    Pneumonia    2022    Patient Active Problem List   Diagnosis Date Noted   Malignant neoplasm of prostate (HCC) 12/30/2023   Localized osteoarthritis of right knee 11/23/2022   Nonspecific abnormal electrocardiogram (ECG) (EKG) 10/06/2022   Pre-op evaluation 10/06/2022   Essential hypertension 07/09/2021   Erectile dysfunction 07/09/2021   Benign prostatic hyperplasia without lower urinary tract symptoms 07/09/2021   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 Authorizing Provider  azithromycin (ZITHROMAX) 250 MG tablet Take 1 tablet (250 mg total) by mouth daily. Take first 2 tablets together, then 1 every day until finished. 01/02/24  Yes Mickie Bail, NP  ascorbic acid (VITAMIN C) 500 MG tablet Take 500 mg by mouth daily.    [provider]   aspirin EC 81 MG tablet Take 81 mg by mouth daily. Swallow whole.    [provider]  Cholecalciferol (VITAMIN D3) 10 MCG (400 UNIT) CAPS Take 400 Units by mouth daily.    [provider]  fluticasone (FLONASE) 50 MCG/ACT nasal spray Place 2 sprays into both nostrils daily. Patient not taking: Reported on 12/30/2023 04/29/23   Medina-Vargas, Monina C, NP  hydrochlorothiazide (HYDRODIURIL) 25 MG tablet TAKE 1 TABLET (25 MG TOTAL) BY MOUTH DAILY. 01/15/23   Ngetich, Dinah C, NP  lisinopril (ZESTRIL) 20 MG tablet TAKE 1 TABLET BY MOUTH EVERY DAY 07/09/23   Ngetich, Dinah C, NP  Multiple Vitamin (MULTIVITAMIN) tablet Take 1 tablet by mouth daily.    [provider]  sildenafil (REVATIO) 20 MG tablet Take 1 tablet (20 mg total) by mouth as needed. 05/21/21   Serena Croissant, MD  silodosin (RAPAFLO) 8 MG CAPS capsule Take 1 capsule (8 mg total) by mouth daily with breakfast. 11/20/20   Serena Croissant, MD  Testosterone Cypionate 200 MG/ML SOLN Testosterone cypionate 100 mg /ml use 10% gel 4 clicks daily 11/26/23   Medina-Vargas, Monina C, NP    Family History Family History  Problem Relation Age of Onset   Heart failure Mother    Heart failure Father    Arthritis Sister    Arthritis Sister    Arthritis Son  Social History Social History   Tobacco Use   Smoking status: Never   Smokeless tobacco: Never  Vaping Use   Vaping status: Never Used  Substance Use Topics   Alcohol use: Never   Drug use: Never     Allergies   Patient has no known allergies.   Review of Systems Review of Systems  Constitutional:  Negative for chills and fever.  HENT:  Positive for congestion, postnasal drip and rhinorrhea. Negative for ear pain and sore throat.   Respiratory:  Positive for cough. Negative for shortness of breath.      Physical Exam Triage Vital Signs ED Triage Vitals [01/02/24 0912]  Encounter Vitals Group     BP      Systolic BP Percentile      Diastolic BP  Percentile      Pulse Rate 92     Resp 18     Temp 98 F (36.7 C)     Temp src      SpO2 95 %     Weight      Height      Head Circumference      Peak Flow      Pain Score      Pain Loc      Pain Education      Exclude from Growth Chart    No data found.  Updated Vital Signs BP (!) 148/85   Pulse 92   Temp 98 F (36.7 C)   Resp 18   SpO2 95%   Visual Acuity Right Eye Distance:   Left Eye Distance:   Bilateral Distance:    Right Eye Near:   Left Eye Near:    Bilateral Near:     Physical Exam Constitutional:      General: He is not in acute distress. HENT:     Right Ear: Tympanic membrane normal.     Left Ear: Tympanic membrane normal.     Nose: Congestion present.     Mouth/Throat:     Mouth: Mucous membranes are moist.     Pharynx: Oropharynx is clear.  Cardiovascular:     Rate and Rhythm: Normal rate and regular rhythm.     Heart sounds: Normal heart sounds.  Pulmonary:     Effort: Pulmonary effort is normal. No respiratory distress.     Breath sounds: Normal breath sounds.  Neurological:     Mental Status: He is alert.      UC Treatments / Results  Labs (all labs ordered are listed, but only abnormal results are displayed) Labs Reviewed - No data to display  EKG   Radiology No results found.  Procedures Procedures (including critical care time)  Medications Ordered in UC Medications - No data to display  Initial Impression / Assessment and Plan / UC Course  I have reviewed the triage vital signs and the nursing notes.  Pertinent labs & imaging results that were available during my care of the patient were reviewed by me and considered in my medical decision making (see chart for details).    Acute upper respiratory infection.  Patient has been symptomatic for 1 week.  Treating today with Zithromax.  Tylenol as needed.  Plain Mucinex as needed.  Instructed patient to follow-up with his PCP if he is not improving.  ED precautions given.   Patient agrees to plan of care.  Final Clinical Impressions(s) / UC Diagnoses   Final diagnoses:  Acute upper respiratory infection  Discharge Instructions      Take the Zithromax as directed.  Follow-up with your primary care provider if your symptoms are not improving.      ED Prescriptions     Medication Sig Dispense Auth. Provider   azithromycin (ZITHROMAX) 250 MG tablet Take 1 tablet (250 mg total) by mouth daily. Take first 2 tablets together, then 1 every day until finished. 6 tablet Mickie Bail, NP      PDMP not reviewed this encounter.   Mickie Bail, NP 01/02/24 [phone]

## 2024-01-02 NOTE — Discharge Instructions (Signed)
Take the Zithromax as directed.  Follow up with your primary care provider if your symptoms are not improving.

## 2024-01-16 ENCOUNTER — Other Ambulatory Visit: Payer: Self-pay | Admitting: Family

## 2024-01-16 DIAGNOSIS — I10 Essential (primary) hypertension: Secondary | ICD-10-CM

## 2024-01-31 ENCOUNTER — Other Ambulatory Visit: Payer: Self-pay | Admitting: Urology

## 2024-02-03 ENCOUNTER — Other Ambulatory Visit: Payer: Self-pay | Admitting: Adult Health

## 2024-02-03 ENCOUNTER — Other Ambulatory Visit: Payer: Self-pay

## 2024-02-03 DIAGNOSIS — E349 Endocrine disorder, unspecified: Secondary | ICD-10-CM

## 2024-02-03 MED ORDER — TESTOSTERONE CYPIONATE 200 MG/ML IJ SOLN: INTRAMUSCULAR | 0 refills | Status: DC

## 2024-02-03 NOTE — Telephone Encounter (Signed)
 Pharmacy requested refill.  Refill that was approved earlier was set on "Print"  Pended and sent to [facility] for approval.

## 2024-02-07 NOTE — Progress Notes (Signed)
 RN spoke with patient to confirm upcoming fiducial's, spaceOAR, and CT Simulation.  No barriers identified at this time, or additional needs.  RN encouraged patient to call with any questions that may arise.

## 2024-02-11 ENCOUNTER — Ambulatory Visit (INDEPENDENT_AMBULATORY_CARE_PROVIDER_SITE_OTHER): Payer: Medicare HMO | Admitting: Family

## 2024-02-11 ENCOUNTER — Encounter: Payer: Self-pay | Admitting: Family

## 2024-02-11 ENCOUNTER — Ambulatory Visit: Payer: Medicare HMO | Admitting: Family

## 2024-02-11 VITALS — BP 124/62 | HR 77 | Temp 98.0°F | Resp 21 | Ht 68.0 in | Wt 177.6 lb

## 2024-02-11 DIAGNOSIS — I10 Essential (primary) hypertension: Secondary | ICD-10-CM

## 2024-02-11 DIAGNOSIS — N529 Male erectile dysfunction, unspecified: Secondary | ICD-10-CM

## 2024-02-11 DIAGNOSIS — H6123 Impacted cerumen, bilateral: Secondary | ICD-10-CM

## 2024-02-11 DIAGNOSIS — N400 Enlarged prostate without lower urinary tract symptoms: Secondary | ICD-10-CM | POA: Diagnosis not present

## 2024-02-11 DIAGNOSIS — C61 Malignant neoplasm of prostate: Secondary | ICD-10-CM

## 2024-02-11 MED ORDER — HYDROCHLOROTHIAZIDE 12.5 MG PO TABS: 12.5000 mg | ORAL_TABLET | Freq: Every day | ORAL | 1 refills | Status: DC

## 2024-02-12 LAB — COMPLETE METABOLIC PANEL WITH GFR
AG Ratio: 1.9 (calc) (ref 1.0–2.5)
ALT: 21 U/L (ref 9–46)
AST: 19 U/L (ref 10–35)
Albumin: 4.5 g/dL (ref 3.6–5.1)
Alkaline phosphatase (APISO): 45 U/L (ref 35–144)
BUN: 11 mg/dL (ref 7–25)
CO2: 27 mmol/L (ref 20–32)
Calcium: 9.4 mg/dL (ref 8.6–10.3)
Chloride: 99 mmol/L (ref 98–110)
Creat: 0.92 mg/dL (ref 0.70–1.28)
Globulin: 2.4 g/dL (ref 1.9–3.7)
Glucose, Bld: 97 mg/dL (ref 65–99)
Potassium: 4.1 mmol/L (ref 3.5–5.3)
Sodium: 134 mmol/L — ABNORMAL LOW (ref 135–146)
Total Bilirubin: 1.6 mg/dL — ABNORMAL HIGH (ref 0.2–1.2)
Total Protein: 6.9 g/dL (ref 6.1–8.1)
eGFR: 88 mL/min/{1.73_m2} (ref >=60)

## 2024-02-12 LAB — LIPID PANEL
Cholesterol: 154 mg/dL (ref <200)
HDL: 44 mg/dL (ref >=40)
LDL Cholesterol (Calc): 89 mg/dL
Non-HDL Cholesterol (Calc): 110 mg/dL (ref <130)
Total CHOL/HDL Ratio: 3.5 (calc) (ref <5.0)
Triglycerides: 115 mg/dL (ref <150)

## 2024-02-12 LAB — TSH: TSH: 1.4 m[IU]/L (ref 0.40–4.50)

## 2024-02-12 LAB — CBC WITH DIFFERENTIAL/PLATELET
Absolute Lymphocytes: 1431 {cells}/uL (ref 850–3900)
Absolute Monocytes: 454 {cells}/uL (ref 200–950)
Basophils Absolute: 38 {cells}/uL (ref 0–200)
Basophils Relative: 0.7 %
Eosinophils Absolute: 81 {cells}/uL (ref 15–500)
Eosinophils Relative: 1.5 %
HCT: 53.1 % — ABNORMAL HIGH (ref 38.5–50.0)
Hemoglobin: 16.2 g/dL (ref 13.2–17.1)
MCH: 22.5 pg — ABNORMAL LOW (ref 27.0–33.0)
MCHC: 30.5 g/dL — ABNORMAL LOW (ref 32.0–36.0)
MCV: 73.8 fL — ABNORMAL LOW (ref 80.0–100.0)
Monocytes Relative: 8.4 %
Neutro Abs: 3397 {cells}/uL (ref 1500–7800)
Neutrophils Relative %: 62.9 %
Platelets: 182 10*3/uL (ref 140–400)
RBC: 7.2 10*6/uL — ABNORMAL HIGH (ref 4.20–5.80)
RDW: 18.9 % — ABNORMAL HIGH (ref 11.0–15.0)
Total Lymphocyte: 26.5 %
WBC: 5.4 10*3/uL (ref 3.8–10.8)

## 2024-02-17 ENCOUNTER — Telehealth: Payer: Self-pay | Admitting: *Deleted

## 2024-02-17 ENCOUNTER — Ambulatory Visit: Payer: Medicare HMO | Admitting: Family

## 2024-02-17 DIAGNOSIS — D751 Secondary polycythemia: Secondary | ICD-10-CM

## 2024-02-17 NOTE — Telephone Encounter (Signed)
 Received call from pt stating he is currently being treated for prostate cancer and his Hct is starting to increase and would like to meet with MD sooner to discuss phlebotomy options.  Lab and MD appt scheduled, pt notified and verbalized understanding.

## 2024-02-18 ENCOUNTER — Encounter (HOSPITAL_COMMUNITY): Payer: Self-pay | Admitting: Urology

## 2024-02-18 NOTE — Progress Notes (Signed)
 Spoke w/ via phone for pre-op interview--- wife, Sergio [name] Lab needs dos----   [employer] results------ current EKG in epic/ chart COVID test -----patient states asymptomatic no test needed Arrive at -------  1100 on 02-25-2024 NPO after MN NO Solid Food.  Clear liquids from MN until--- 1000 Pre-Surgery Ensure or G2:  n/a ERAS protocol:  yes  Med rec completed Medications to take morning of surgery ----- rapaflo Diabetic medication ----- n/a  GLP1 agonist last dose: n/a GLP1 instructions:  Patient instructed no nail polish to be worn day of surgery Patient instructed to bring photo id and insurance card day of surgery Patient aware to have Driver (ride ) / caregiver    for 24 hours after surgery - wife, Sergio [name] Patient Special Instructions -----  will do one fleet enema night before surgery Wife stated pt was given instructions to stop asa prior to surgery,  02-20-2024 will be last dose. Pre-Op special Instructions -----  n/a  Patient verbalized understanding of instructions that were given at this phone interview. Patient denies chest pain, sob, fever, cough at the interview.

## 2024-02-20 NOTE — Progress Notes (Signed)
 Provider: Richarda Blade FNP-C   Sergio [name], Sergio Citrin, NP  Patient Care Team: Harlin Mazzoni, Sergio Citrin, NP as PCP - General (Family Medicine) Sergio Croissant, MD as Consulting Physician (Hematology and Oncology) Sergio Cushing, RN as Oncology Nurse Navigator  Extended Emergency Contact Information Primary Emergency Contact: Sergio [name] Home Phone: [phone] Mobile Phone: [phone] Relation: Spouse Secondary Emergency Contact: Sergio [name] Mobile Phone: [phone] Relation: Daughter  Code Status:  Full Code  Goals of care: Advanced Directive information    02/11/2024    8:49 AM  Advanced Directives  Does Patient Have a Medical Advance Directive? Yes  Type of Estate agent of [place];Living will  Does patient want to make changes to medical advance directive? No - Patient declined  Copy of Healthcare Power of Attorney in Chart? No - copy requested     Chief Complaint  Patient presents with   Medical Management of Chronic Issues    6 month follow up chronic issue fasting labs. Patient would like to discuss blood pressure medication. Discuss the need for  AWV and shingles.     Discussed the use of AI scribe software for clinical note transcription with the patient, who gave verbal consent to proceed.  History of Present Illness   The patient presents for a six-month follow-up regarding chronic medical conditions. He is here with his wife, Sergio [name] on the phone.  He is following up for prostate cancer, diagnosed after a biopsy revealed a small spot. His prostate was too large for surgical intervention, so he is scheduled to undergo low-dose radiation therapy. PSA levels were monitored and remained around 5.2 before the biopsy. No current symptoms related to prostate cancer, such as urinary frequency, bleeding, or difficulty urinating.  About three to four years ago, he experienced urinary frequency, which was managed with medication. He currently takes Rapaflo  8 mg daily, which has alleviated these symptoms. He reports nocturia once or twice per night but no other urinary symptoms.  His blood pressure typically ranges from 120/80 to 140/89. He donates blood every 8 to 10 weeks to manage his hemoglobin levels, which were last recorded at 15.7 in February. He has a history of elevated hemoglobin and has been under the care of a hematologist for this condition.  His current medications include a multivitamin daily, Revatio 20 mg as needed for erectile dysfunction, vitamin C, hydrochlorothiazide 12.5 mg daily for blood pressure, and testosterone replacement therapy. He recently completed a course of antibiotics for an upper respiratory infection.  His wife Sergio [name],assists with his medication management. He works but is currently taking time off today for health reasons. No history of smoking and reports a good appetite.    Past Medical History:  Diagnosis Date   Benign localized prostatic hyperplasia with lower urinary tract symptoms (LUTS)    Erectile dysfunction    Hyperlipidemia    Hypertension    cardiology evaluation by dr Rosemary Holms (lov in epic / released 07-22-2023)   NUC 10-20-2022  LR w/ normal perfusion and wall motion,  nuclear ef 58%;  echo 12-20-2021  ef 55%, mild LVH, G1DD, mild TR   Hypogonadism in male    Incomplete emptying of bladder    Malignant neoplasm prostate [facility]) 11/2023   primary urologist--- dr gay/  radiation oncologist--- dr Kathrynn Running;   gleason 3+4,  psa 5.26,  vol 101 ml   OA (osteoarthritis)    Secondary erythrocytosis 2019   hematologsit--- dr Pamelia Hoit;  [facility]  due to testosterone  replacement therapy for hypogonadism;  treated w/ phlebatomies since 12/ 2019   Wears contact lenses    Past Surgical History:  Procedure Laterality Date   COLONOSCOPY  2021   RETINAL DETACHMENT SURGERY Right    1990s   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   Procedure: TOTAL KNEE ARTHROPLASTY;   Surgeon: Joen Laura, MD;  Location: [facility];  Service: Orthopedics;  Laterality: Right;   WISDOM TOOTH EXTRACTION      No Known Allergies  Allergies as of 02/11/2024   No Known Allergies      Medication List        Accurate as of February 11, 2024 11:59 PM. If you have any questions, ask your nurse or doctor.          STOP taking these medications    azithromycin 250 MG tablet Commonly known as: ZITHROMAX Stopped by: [name]       TAKE these medications    ascorbic acid 500 MG tablet Commonly known as: VITAMIN C Take 500 mg by mouth daily.   aspirin EC 81 MG tablet Take 81 mg by mouth daily. Swallow whole.   fluticasone 50 MCG/ACT nasal spray Commonly known as: FLONASE Place 2 sprays into both nostrils daily.   hydrochlorothiazide 12.5 MG tablet Commonly known as: HYDRODIURIL Take 1 tablet (12.5 mg total) by mouth daily. What changed:  medication strength how much to take Changed by: Abree Romick C Skila Rollins   lisinopril 20 MG tablet Commonly known as: ZESTRIL TAKE 1 TABLET BY MOUTH EVERY DAY   MIRALAX PO Take by mouth as needed.   multivitamin tablet Take 1 tablet by mouth daily.   sildenafil 20 MG tablet Commonly known as: REVATIO Take 1 tablet (20 mg total) by mouth as needed.   silodosin 8 MG Caps capsule Commonly known as: RAPAFLO Take 1 capsule (8 mg total) by mouth daily with breakfast.   Testosterone Cypionate 200 MG/ML Soln Testosterone cypionate 100 mg /ml use 10% gel 4 clicks daily   Vitamin D3 10 MCG (400 UNIT) Caps Take 400 Units by mouth daily.        Review of Systems  Constitutional:  Negative for appetite change, chills, fatigue, fever and unexpected weight change.  HENT:  Negative for congestion, dental problem, ear discharge, ear pain, facial swelling, hearing loss, nosebleeds, postnasal drip, rhinorrhea, sinus pressure, sinus pain, sneezing, sore throat, tinnitus and trouble swallowing.   Eyes:  Negative for pain,  discharge, redness, itching and visual disturbance.  Respiratory:  Negative for cough, chest tightness, shortness of breath and wheezing.   Cardiovascular:  Negative for chest pain, palpitations and leg swelling.  Gastrointestinal:  Negative for abdominal distention, abdominal pain, blood in stool, constipation, diarrhea, nausea and vomiting.  Endocrine: Negative for cold intolerance, heat intolerance, polydipsia, polyphagia and polyuria.  Genitourinary:  Negative for difficulty urinating, dysuria, flank pain, frequency and urgency.       Nocturia has improve   Musculoskeletal:  Negative for arthralgias, back pain, gait problem, joint swelling, myalgias, neck pain and neck stiffness.  Skin:  Negative for color change, pallor, rash and wound.  Neurological:  Negative for dizziness, syncope, speech difficulty, weakness, light-headedness, numbness and headaches.  Hematological:  Does not bruise/bleed easily.  Psychiatric/Behavioral:  Negative for agitation, behavioral problems, confusion, hallucinations, self-injury, sleep disturbance and suicidal ideas. The patient is not nervous/anxious.     Immunization History  Administered Date(s) Administered   Fluad Quad(high Dose 65+) 01/23/2022   Fluad Trivalent(High Dose 65+)  09/20/2023   Moderna Sars-Covid-2 Vaccination 12/11/2019, 01/08/2020   PNEUMOCOCCAL CONJUGATE-20 02/05/2023   Tdap 02/22/2023   Pertinent  Health Maintenance Due  Topic Date Due   Colonoscopy  10/18/2030   INFLUENZA VACCINE  Completed      01/25/2023    8:50 AM 02/05/2023    8:55 AM 04/29/2023    3:12 PM 08/05/2023    1:32 PM 02/11/2024    8:48 AM  Fall Risk  Falls in the past year? 0 0 0 0 0  Was there an injury with Fall? 0 0 0  0  Fall Risk Category Calculator 0 0 0  0  Patient at Risk for Falls Due to No Fall Risks No Fall Risks No Fall Risks  No Fall Risks  Fall risk Follow up Falls evaluation completed Falls evaluation completed   Falls evaluation completed    Functional Status Survey:    Vitals:   02/11/24 0823  BP: 124/62  Pulse: 77  Resp: (!) 21  Temp: 98 F (36.7 C)  SpO2: 98%  Weight: 177 lb 9.6 oz (80.6 kg)  Height: 5\' 8"  (1.727 m)   Body mass index is 27 kg/m. Physical Exam VITALS: P- 77, BP- 124/62, SaO2- 98% MEASUREMENTS: Weight- 177. GENERAL: Alert, cooperative, well developed, no acute distress. HEENT: Normocephalic, normal oropharynx, moist mucous membranes, cerumen buildup in ears bilateral ear lavaged with warm water and hydrogen peroxide,moderate amount of cerumen removed.No instrument used.tolerated procedure well, nose normal, no sinus tenderness. CHEST: Clear to auscultation bilaterally, no wheezes, rhonchi, or crackles. CARDIOVASCULAR: Normal heart rate and rhythm, S1 and S2 normal without murmurs. ABDOMEN: Soft, non-tender, non-distended, without organomegaly, normal bowel sounds. EXTREMITIES: No cyanosis or edema. MUSCULOSKELETAL: No knee tenderness, hips and shoulders normal range of motion. NEUROLOGICAL: Cranial nerves grossly intact, moves all extremities without gross motor or sensory deficit. SKIN: No rash, no lesion or erythema  PSYCHIATRY/BEHAVIORAL: Mood stable    Labs reviewed: Recent Labs    08/05/23 1432 02/11/24 0941  NA 131* 134*  K 3.5 4.1  CL 96* 99  CO2 26 27  GLUCOSE 85 97  BUN 12 11  CREATININE 0.89 0.92  CALCIUM 9.5 9.4   Recent Labs    08/05/23 1432 02/11/24 0941  AST 18 19  ALT 19 21  BILITOT 2.0* 1.6*  PROT 6.7 6.9   Recent Labs    06/18/23 0737 08/05/23 1432 02/11/24 0941  WBC 6.6 6.1 5.4  NEUTROABS 4.6 3,666 3,397  HGB 15.2 16.4 16.2  HCT 47.3 53.0* 53.1*  MCV 71.5* 73.1* 73.8*  PLT 170 185 182   Lab Results  Component Value Date   TSH 1.40 02/11/2024   No results found for: "HGBA1C" Lab Results  Component Value Date   CHOL 154 02/11/2024   HDL 44 02/11/2024   LDLCALC 89 02/11/2024   TRIG 115 02/11/2024   CHOLHDL 3.5 02/11/2024    Significant  Diagnostic Results in last 30 days:  No results found.  Assessment/Plan  Prostate Cancer Diagnosed with early-stage prostate cancer. PSA levels around 5.2. Biopsy confirmed cancer. Prostate too large for surgery; plan is low-dose radiation therapy. No symptoms like bleeding or urinary retention. Discussed risks, benefits, and outcomes of radiation therapy, which has a high success rate in early-stage prostate cancer. Early detection improves prognosis. - Proceed with marker placement on March 21st - Begin targeted low-dose radiation therapy on March 25th - Continue monitoring PSA levels with urologist  Benign Prostatic Hyperplasia (BPH) Urinary frequency managed with Rapaflo 8  mg. Experiences nocturia once or twice per night, no other significant urinary symptoms. - Continue Rapaflo 8 mg daily  Hypertension Blood pressure well-controlled with readings from 120/80 to 140/89. On hydrochlorothiazide 12.5 mg and lisinopril 20 mg. Discussed changing hydrochlorothiazide prescription to match current dosing. - Change hydrochlorothiazide prescription to 12.5 mg - Continue lisinopril 20 mg - Monitor blood pressure regularly  Polycythemia Elevated hemoglobin levels managed with regular phlebotomy every 8-10 weeks. Hemoglobin was 15.7 in February. Follow-up with hematologist Dr. Pamelia Hoit. - Continue phlebotomy every 8-10 weeks - Follow-up with hematologist Dr. Pamelia Hoit  General Health Maintenance Due for shingles vaccine. Never had chickenpox but still at risk for shingles. Advised to boost immunity in preparation for radiation therapy by reducing sugar intake and increasing vegetable consumption. - Get shingles vaccine at [facility] - Boost immunity by reducing sugar intake and increasing vegetable consumption  Follow-up - Perform lab work today - Schedule six-month follow-up - Schedule annual wellness visit.    Family/ staff Communication: Reviewed plan of care with patient and wife on the phone  verbalized understanding   Labs/tests ordered:  - CBC with Differential/Platelet - CMP with eGFR([facility]) - TSH - Lipid panel  Next Appointment : Return in about 6 months (around 08/13/2024) for medical mangement of chronic issues., Annual wellness visit soon. Marland Kitchen   Spent 30 minutes of Face to face and non-face to face with patient  >50% time spent counseling; reviewing medical record; tests; labs; documentation and developing future plan of care.   Caesar Bookman, NP

## 2024-02-25 ENCOUNTER — Other Ambulatory Visit: Payer: Self-pay

## 2024-02-25 ENCOUNTER — Ambulatory Visit (HOSPITAL_BASED_OUTPATIENT_CLINIC_OR_DEPARTMENT_OTHER): Admitting: Anesthesiology

## 2024-02-25 ENCOUNTER — Ambulatory Visit (HOSPITAL_COMMUNITY)
Admission: RE | Admit: 2024-02-25 | Discharge: 2024-02-25 | Disposition: A | Discharge status: Home / Self Care | Payer: Medicare HMO | Attending: Urology | Admitting: Urology

## 2024-02-25 ENCOUNTER — Encounter (HOSPITAL_COMMUNITY)
Admission: RE | Disposition: A | Discharge status: Home / Self Care | Payer: Self-pay | Source: Home / Self Care | Attending: Urology

## 2024-02-25 ENCOUNTER — Ambulatory Visit (HOSPITAL_COMMUNITY): Admitting: Anesthesiology

## 2024-02-25 ENCOUNTER — Encounter (HOSPITAL_COMMUNITY): Payer: Self-pay | Admitting: Urology

## 2024-02-25 DIAGNOSIS — Z79899 Other long term (current) drug therapy: Secondary | ICD-10-CM | POA: Insufficient documentation

## 2024-02-25 DIAGNOSIS — C61 Malignant neoplasm of prostate: Secondary | ICD-10-CM

## 2024-02-25 DIAGNOSIS — I10 Essential (primary) hypertension: Secondary | ICD-10-CM | POA: Insufficient documentation

## 2024-02-25 DIAGNOSIS — N400 Enlarged prostate without lower urinary tract symptoms: Secondary | ICD-10-CM | POA: Insufficient documentation

## 2024-02-25 DIAGNOSIS — Z01818 Encounter for other preprocedural examination: Secondary | ICD-10-CM

## 2024-02-25 HISTORY — DX: Retention of urine, unspecified: R33.9

## 2024-02-25 HISTORY — PX: GOLD SEED IMPLANT: SHX6343

## 2024-02-25 HISTORY — DX: Testicular hypofunction: E29.1

## 2024-02-25 HISTORY — PX: SPACE OAR INSTILLATION: SHX6769

## 2024-02-25 HISTORY — DX: Presence of spectacles and contact lenses: Z97.3

## 2024-02-25 HISTORY — DX: Benign prostatic hyperplasia with lower urinary tract symptoms: N40.1

## 2024-02-25 HISTORY — DX: Unspecified osteoarthritis, unspecified site: M19.90

## 2024-02-25 HISTORY — DX: Hyperlipidemia, unspecified: E78.5

## 2024-02-25 LAB — POCT I-STAT, CHEM 8
BUN: 10 mg/dL (ref 8–23)
Calcium, Ion: 1.21 mmol/L (ref 1.15–1.40)
Chloride: 98 mmol/L (ref 98–111)
Creatinine, Ser: 0.9 mg/dL (ref 0.61–1.24)
Glucose, Bld: 96 mg/dL (ref 70–99)
HCT: 51 % (ref 39.0–52.0)
Hemoglobin: 17.3 g/dL — ABNORMAL HIGH (ref 13.0–17.0)
Potassium: 3.6 mmol/L (ref 3.5–5.1)
Sodium: 136 mmol/L (ref 135–145)
TCO2: 24 mmol/L (ref 22–32)

## 2024-02-25 SURGERY — INSERTION, GOLD SEEDS
Anesthesia: Monitor Anesthesia Care | Site: Prostate

## 2024-02-25 MED ORDER — ORAL CARE MOUTH RINSE: 15.0000 mL | Freq: Once | OROMUCOSAL | Status: AC

## 2024-02-25 MED ORDER — PHENYLEPHRINE 80 MCG/ML (10ML) SYRINGE FOR IV PUSH (FOR BLOOD PRESSURE SUPPORT)
PREFILLED_SYRINGE | INTRAVENOUS | Status: DC | PRN
  Administered 2024-02-25: 80 ug via INTRAVENOUS

## 2024-02-25 MED ORDER — ACETAMINOPHEN 500 MG PO TABS
ORAL_TABLET | ORAL | Status: AC
  Filled 2024-02-25: qty 2

## 2024-02-25 MED ORDER — PHENYLEPHRINE 80 MCG/ML (10ML) SYRINGE FOR IV PUSH (FOR BLOOD PRESSURE SUPPORT)
PREFILLED_SYRINGE | INTRAVENOUS | Status: AC
  Filled 2024-02-25: qty 10

## 2024-02-25 MED ORDER — FENTANYL CITRATE (PF) 250 MCG/5ML IJ SOLN
INTRAMUSCULAR | Status: AC
  Filled 2024-02-25: qty 5

## 2024-02-25 MED ORDER — CEFAZOLIN SODIUM-DEXTROSE 2-4 GM/100ML-% IV SOLN
2.0000 g | INTRAVENOUS | Status: AC
  Administered 2024-02-25: 2 g via INTRAVENOUS
  Filled 2024-02-25: qty 100

## 2024-02-25 MED ORDER — DEXAMETHASONE SODIUM PHOSPHATE 10 MG/ML IJ SOLN
INTRAMUSCULAR | Status: DC | PRN
Start: 2024-02-25 — End: 2024-02-25
  Administered 2024-02-25: 8 mg via INTRAVENOUS

## 2024-02-25 MED ORDER — LIDOCAINE HCL (PF) 2 % IJ SOLN
INTRAMUSCULAR | Status: AC
  Filled 2024-02-25: qty 10

## 2024-02-25 MED ORDER — ACETAMINOPHEN 500 MG PO TABS
1000.0000 mg | ORAL_TABLET | Freq: Once | ORAL | Status: AC
  Administered 2024-02-25: 1000 mg via ORAL

## 2024-02-25 MED ORDER — FENTANYL CITRATE (PF) 250 MCG/5ML IJ SOLN
INTRAMUSCULAR | Status: DC | PRN
  Administered 2024-02-25: 50 ug via INTRAVENOUS

## 2024-02-25 MED ORDER — OXYCODONE HCL 5 MG/5ML PO SOLN: 5.0000 mg | Freq: Once | ORAL | Status: DC | PRN

## 2024-02-25 MED ORDER — CHLORHEXIDINE GLUCONATE 0.12 % MT SOLN
OROMUCOSAL | Status: DC
Start: 2024-02-25 — End: 2024-02-25
  Filled 2024-02-25: qty 15

## 2024-02-25 MED ORDER — SODIUM CHLORIDE (PF) 0.9 % IJ SOLN
INTRAMUSCULAR | Status: DC | PRN
  Administered 2024-02-25: 10 mL

## 2024-02-25 MED ORDER — AMISULPRIDE (ANTIEMETIC) 5 MG/2ML IV SOLN: 10.0000 mg | Freq: Once | INTRAVENOUS | Status: DC | PRN

## 2024-02-25 MED ORDER — SODIUM CHLORIDE (PF) 0.9 % IJ SOLN
INTRAMUSCULAR | Status: AC
  Filled 2024-02-25: qty 10

## 2024-02-25 MED ORDER — LIDOCAINE HCL 2 % IJ SOLN
INTRAMUSCULAR | Status: DC | PRN
  Administered 2024-02-25: 10 mL

## 2024-02-25 MED ORDER — OXYCODONE HCL 5 MG PO TABS: 5.0000 mg | ORAL_TABLET | Freq: Once | ORAL | Status: DC | PRN

## 2024-02-25 MED ORDER — ONDANSETRON HCL 4 MG/2ML IJ SOLN
INTRAMUSCULAR | Status: DC | PRN
  Administered 2024-02-25: 4 mg via INTRAVENOUS

## 2024-02-25 MED ORDER — FENTANYL CITRATE (PF) 100 MCG/2ML IJ SOLN: 25.0000 ug | INTRAMUSCULAR | Status: DC | PRN

## 2024-02-25 MED ORDER — LACTATED RINGERS IV SOLN: INTRAVENOUS | Status: DC | PRN

## 2024-02-25 MED ORDER — TRAMADOL HCL 50 MG PO TABS: 50.0000 mg | ORAL_TABLET | Freq: Four times a day (QID) | ORAL | 0 refills | Status: DC | PRN

## 2024-02-25 MED ORDER — CHLORHEXIDINE GLUCONATE 0.12 % MT SOLN
15.0000 mL | Freq: Once | OROMUCOSAL | Status: AC
  Administered 2024-02-25: 15 mL via OROMUCOSAL

## 2024-02-25 MED ORDER — PROPOFOL 10 MG/ML IV BOLUS
INTRAVENOUS | Status: DC | PRN
Start: 2024-02-25 — End: 2024-02-25
  Administered 2024-02-25: 50 mg via INTRAVENOUS
  Administered 2024-02-25: 75 ug/kg/min via INTRAVENOUS

## 2024-02-25 MED ORDER — LACTATED RINGERS IV SOLN: INTRAVENOUS | Status: DC

## 2024-02-25 SURGICAL SUPPLY — 23 items
BLADE CLIPPER SENSICLIP SURGIC (BLADE) ×2 IMPLANT
CNTNR URN SCR LID CUP LEK RST (MISCELLANEOUS) ×2 IMPLANT
COVER BACK TABLE 60X90IN (DRAPES) ×2 IMPLANT
DRSG TEGADERM 4X4.75 (GAUZE/BANDAGES/DRESSINGS) IMPLANT
DRSG TEGADERM 8X12 (GAUZE/BANDAGES/DRESSINGS) ×2 IMPLANT
GAUZE SPONGE 4X4 12PLY STRL (GAUZE/BANDAGES/DRESSINGS) IMPLANT
GLOVE BIO SURGEON STRL SZ7.5 (GLOVE) ×2 IMPLANT
GLOVE SURG ORTHO 8.5 STRL (GLOVE) ×2 IMPLANT
IMPL SPACEOAR VUE SYSTEM (Spacer) ×2 IMPLANT
IMPLANT SPACEOAR VUE SYSTEM (Spacer) ×1 IMPLANT
MARKER GOLD PRELOAD 1.2X3 (Urological Implant) ×2 IMPLANT
MARKER SKIN DUAL TIP RULER LAB (MISCELLANEOUS) ×2 IMPLANT
NDL SPNL 22GX3.5 QUINCKE BK (NEEDLE) ×2 IMPLANT
NEEDLE SPNL 22GX3.5 QUINCKE BK (NEEDLE) ×1 IMPLANT
SEED GOLD PRELOAD 1.2X3 (Urological Implant) ×1 IMPLANT
SHEATH ULTRASOUND LF (SHEATH) IMPLANT
SHEATH ULTRASOUND LTX NONSTRL (SHEATH) IMPLANT
SLEEVE SCD COMPRESS KNEE MED (STOCKING) ×2 IMPLANT
SURGILUBE 2OZ TUBE FLIPTOP (MISCELLANEOUS) ×2 IMPLANT
SYR 10ML LL (SYRINGE) IMPLANT
SYR CONTROL 10ML LL (SYRINGE) ×2 IMPLANT
TOWEL OR 17X24 6PK STRL BLUE (TOWEL DISPOSABLE) ×2 IMPLANT
UNDERPAD 30X36 HEAVY ABSORB (UNDERPADS AND DIAPERS) ×2 IMPLANT

## 2024-02-25 NOTE — Brief Op Note (Signed)
 02/25/2024  1:15 PM  PATIENT:  Sergio [name]  73 y.o. male  PRE-OPERATIVE DIAGNOSIS:  PROSTATE CANCER  POST-OPERATIVE DIAGNOSIS:  PROSTATE CANCER  PROCEDURE:  Procedure(s) with comments: INSERTION, GOLD SEEDS (N/A) - /[facility] INJECTION, HYDROGEL SPACER (N/A)  SURGEON:  Surgeons and Role:    * Manny, Delbert Phenix., MD - Primary  PHYSICIAN ASSISTANT:   ASSISTANTS: none   ANESTHESIA:   local and general  EBL:  minimal   BLOOD ADMINISTERED:none  DRAINS: none   LOCAL MEDICATIONS USED:  NONE  SPECIMEN:  No Specimen  DISPOSITION OF SPECIMEN:  N/A  COUNTS:  YES  TOURNIQUET:  * No tourniquets in log *  DICTATION: .Other Dictation: Dictation Number [id number]  PLAN OF CARE: Discharge to home after PACU  PATIENT DISPOSITION:  PACU - hemodynamically stable.   Delay start of Pharmacological VTE agent (>24hrs) due to surgical blood loss or risk of bleeding: yes

## 2024-02-25 NOTE — Anesthesia Procedure Notes (Signed)
 Procedure Name: MAC Date/Time: 02/25/2024 12:55 PM  Performed by: Micki Riley, CRNAPre-anesthesia Checklist: Patient identified, Emergency Drugs available, Suction available, Patient being monitored and Timeout performed Patient Re-evaluated:Patient Re-evaluated prior to induction Oxygen Delivery Method: Simple face mask Preoxygenation: Pre-oxygenation with 100% oxygen Induction Type: IV induction

## 2024-02-25 NOTE — Anesthesia Preprocedure Evaluation (Addendum)
 Anesthesia Evaluation  Patient identified by MRN, date of birth, ID band Patient awake    Reviewed: Allergy & Precautions, NPO status , Patient's Chart, lab work & pertinent test results  Airway Mallampati: II  TM Distance: >3 FB Neck ROM: Full    Dental  (+) Teeth Intact, Poor Dentition   Pulmonary neg pulmonary ROS   Pulmonary exam normal breath sounds clear to auscultation       Cardiovascular hypertension, Pt. on medications Normal cardiovascular exam Rhythm:Regular Rate:Normal     Neuro/Psych negative neurological ROS  negative psych ROS   GI/Hepatic negative GI ROS, Neg liver ROS,,,  Endo/Other  negative endocrine ROS    Renal/GU negative Renal ROS   PROSTATE CANCER    Musculoskeletal  (+) Arthritis ,    Abdominal   Peds  Hematology negative hematology ROS (+)   Anesthesia Other Findings Day of surgery medications reviewed with the patient.  Reproductive/Obstetrics                             Anesthesia Physical Anesthesia Plan  ASA: 2  Anesthesia Plan: MAC   Post-op Pain Management: Tylenol PO (pre-op)*   Induction: Intravenous  PONV Risk Score and Plan: 1 and TIVA, Treatment may vary due to age or medical condition, Dexamethasone and Ondansetron  Airway Management Planned: Natural Airway and Simple Face Mask  Additional Equipment:   Intra-op Plan:   Post-operative Plan:   Informed Consent: I have reviewed the patients History and Physical, chart, labs and discussed the procedure including the risks, benefits and alternatives for the proposed anesthesia with the patient or authorized representative who has indicated his/her understanding and acceptance.     Dental advisory given  Plan Discussed with: CRNA and Anesthesiologist  Anesthesia Plan Comments:         Anesthesia Quick Evaluation

## 2024-02-25 NOTE — H&P (Signed)
 Sergio [name] is an 73 y.o. male.    Chief Complaint: Pre-Op Prostate Fiducial Marker and SPACE-OAR placement  HPI:   1 - Moderate Risk Prostate Cancer in Large Prostate on Exogenous Androgens - moderat risk disease on BX 2025. TRUS 101gm. Recs to hold androgens.   Today "Sergio [name]" is seen to proceed with prostate fiducial marker placement / SPACE-OAR.   Past Medical History:  Diagnosis Date   Benign localized prostatic hyperplasia with lower urinary tract symptoms (LUTS)    Erectile dysfunction    Hyperlipidemia    Hypertension    cardiology evaluation by dr Rosemary Holms (lov in epic / released 07-22-2023)   NUC 10-20-2022  LR w/ normal perfusion and wall motion,  nuclear ef 58%;  echo 12-20-2021  ef 55%, mild LVH, G1DD, mild TR   Hypogonadism in male    Incomplete emptying of bladder    Malignant neoplasm prostate ([facility]) 11/2023   primary urologist--- dr gay/  radiation oncologist--- dr Kathrynn Running;   gleason 3+4,  psa 5.26,  vol 101 ml   OA (osteoarthritis)    Secondary erythrocytosis 2019   hematologsit--- dr Pamelia Hoit;  [facility]  due to testosterone replacement therapy for hypogonadism;  treated w/ phlebatomies since 12/ 2019   Wears contact lenses     Past Surgical History:  Procedure Laterality Date   COLONOSCOPY  2021   RETINAL DETACHMENT SURGERY Right    1990s   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   Procedure: TOTAL KNEE ARTHROPLASTY;  Surgeon: Joen Laura, MD;  Location: [facility];  Service: Orthopedics;  Laterality: Right;   WISDOM TOOTH EXTRACTION      Family History  Problem Relation Age of Onset   Heart failure Mother    Heart failure Father    Arthritis Sister    Arthritis Sister    Arthritis Son    Social History:  reports that he has never smoked. He has never used smokeless tobacco. He reports that he does not drink alcohol and does not use drugs.  Allergies: No Known Allergies  No medications prior to admission.     No results found for this or any previous visit (from the past 48 hours). No results found.  Review of Systems  Constitutional:  Negative for chills and fever.  All other systems reviewed and are negative.   Height 5\' 9"  (1.753 m), weight 72.6 kg. Physical Exam Vitals reviewed.  HENT:     Head: Normocephalic.  Eyes:     Pupils: Pupils are equal, round, and reactive to light.  Cardiovascular:     Rate and Rhythm: Normal rate.  Pulmonary:     Effort: Pulmonary effort is normal.  Abdominal:     General: Abdomen is flat.  Genitourinary:    Comments: No CVAT at present Musculoskeletal:        General: Normal range of motion.     Cervical back: Normal range of motion.  Skin:    General: Skin is warm.  Neurological:     General: No focal deficit present.     Mental Status: He is alert.  Psychiatric:        Mood and Affect: Mood normal.      Assessment/Plan  Proceed as planned with prostate fiducial marker and SPACE-OAR palcement. Risks (including retention and obstructive voiding with radiation at his larger gland size), benefits, alternatives, expected peri-op course discussed. Strongly rec to stop exogenous androgens in face of androgen sensitive malignancy.  Loletta Parish., MD 02/25/2024, 9:46 AM

## 2024-02-25 NOTE — Anesthesia Postprocedure Evaluation (Signed)
 Anesthesia Post Note  Patient: Sergio [name]  Procedure(s) Performed: INSERTION, GOLD SEEDS (Prostate) INJECTION, HYDROGEL SPACER (Prostate)     Patient location during evaluation: PACU Anesthesia Type: MAC Level of consciousness: awake and alert Pain management: pain level controlled Vital Signs Assessment: post-procedure vital signs reviewed and stable Respiratory status: spontaneous breathing, nonlabored ventilation, respiratory function stable and patient connected to nasal cannula oxygen Cardiovascular status: stable and blood pressure returned to baseline Postop Assessment: no apparent nausea or vomiting Anesthetic complications: no  No notable events documented.  Last Vitals:  Vitals:   02/25/24 1400 02/25/24 1408  BP: 123/84 127/87  Pulse: 67 66  Resp: 12 15  Temp: 36.5 C   SpO2: 95% 97%    Last Pain:  Vitals:   02/25/24 1408  TempSrc:   PainSc: 0-No pain                 Giomar Gusler L Sergio [name]

## 2024-02-25 NOTE — Transfer of Care (Signed)
 Immediate Anesthesia Transfer of Care Note  Patient: Sergio [name]  Procedure(s) Performed: INSERTION, GOLD SEEDS (Prostate) INJECTION, HYDROGEL SPACER (Prostate)  Patient Location: PACU  Anesthesia Type:MAC  Level of Consciousness: awake, alert , and oriented  Airway & Oxygen Therapy: Patient Spontanous Breathing  Post-op Assessment: Report given to RN and Post -op Vital signs reviewed and stable  Post vital signs: Reviewed and stable  Last Vitals:  Vitals Value Taken Time  BP 118/79 02/25/24 1325  Temp    Pulse 76 02/25/24 1325  Resp 20 02/25/24 1325  SpO2 97 % 02/25/24 1325  Vitals shown include unfiled device data.  Last Pain:  Vitals:   02/25/24 1116  TempSrc: Oral  PainSc: 0-No pain      Patients Stated Pain Goal: 5 (02/25/24 1116)  Complications: No notable events documented.

## 2024-02-25 NOTE — Op Note (Signed)
 NAME: Sergio [name], Sergio [name] MEDICAL RECORD NO: [id number] ACCOUNT NO: [id number] DATE OF BIRTH: Apr 13, 1951 FACILITY: [facility] LOCATION: [facility] PHYSICIAN: Sebastian Ache, MD  Operative Report   DATE OF PROCEDURE: 02/25/2024  PREOPERATIVE DIAGNOSIS:  Moderate-risk prostate cancer.  NAME OF PROCEDURE: 1.  Prostate fiducial marker placement. 2.  SpaceOAR gel matrix injection. 3.  Transrectal ultrasound guidance for fiducial marker placement.  ESTIMATED BLOOD LOSS:  Nil.  COMPLICATIONS:  None.  SPECIMENS:  None.  FINDINGS: 1.  Excellent placement of fiducial markers in the right mid base, right mid apex, left lateral mid locations. 2.  Excellent displacement of the anterior rectal wall posteriorly with SpaceOAR gel matrix.  INDICATIONS:  The patient is a pleasant 73 year old man found to have moderate risk of adenocarcinoma of the prostate.  He is on exogenous androgens and has a very large gland.  Options of management including ablative therapy versus surgical extirpation, and  he admittedly wished to proceed with primary radiation understanding that he does have increased risk of restrictive problems given his large gland.  As part of his radiation path, he presents today for fiducial marker placement and SpaceOAR.  Informed  consent was obtained and placed in the medical record.  DESCRIPTION OF PROCEDURE:  The patient, [name] verified and procedure being prostate fiducial marker and SpaceOAR placement was confirmed. Procedure timeout was performed.  Intravenous antibiotic administered.  Monitored anesthesia care and  conscious sedation administered.  The patient was placed into a medium lithotomy position.  Sterile field was created, prepping and draping the patient's perineum using iodine after his tucking and penis and scrotum out of the way with large Tegaderm.   Transrectal ultrasound probe was introduced.  Using transperineal approach, a fiducial markers were placed in the  right mid base, right mid apex, left mid lateral locations.  Next, ultrasound probe was taken off of the anterior tension, which  opened up the prerectal space.  At midline mid gland via transperineal location, the SpaceOAR introducer needle was placed into the anterior perirectal fat.  Position was confirmed using 2 mL of normal saline hydrodissection.  Next, 10 mL of SpaceOAR  gel matrix was introduced over 10 seconds as per manufacturer guidelines.  This resulted in excellent posterior displacement of the anterior rectal wall.  Procedure was terminated.  The patient tolerated the procedure well.  No immediate periprocedural  complications.  The patient was taken to postanesthesia care unit in stable condition.   VAI D: 02/25/2024 1:20:21 pm T: 02/25/2024 9:18:00 pm  JOB: [id number]

## 2024-02-25 NOTE — Discharge Instructions (Addendum)
 1 - You may have urinary urgency (bladder spasms) and bloody urine on / off x few days. This is normal.  2 - Call MD or go to ER for fever >102, severe pain / nausea / vomiting not relieved by medications, or acute change in medical status      No acetaminophen/Tylenol until after 6:45 pm today if needed.     Post Anesthesia Home Care Instructions  Activity: Get plenty of rest for the remainder of the day. A responsible individual must stay with you for 24 hours following the procedure.  For the next 24 hours, DO NOT: -Drive a car -Advertising copywriter -Drink alcoholic beverages -Take any medication unless instructed by your physician -Make any legal decisions or sign important papers.  Meals: Start with liquid foods such as gelatin or soup. Progress to regular foods as tolerated. Avoid greasy, spicy, heavy foods. If nausea and/or vomiting occur, drink only clear liquids until the nausea and/or vomiting subsides. Call your physician if vomiting continues.  Special Instructions/Symptoms: Your throat may feel dry or sore from the anesthesia or the breathing tube placed in your throat during surgery. If this causes discomfort, gargle with warm salt water. The discomfort should disappear within 24 hours.

## 2024-02-28 ENCOUNTER — Telehealth: Payer: Self-pay | Admitting: *Deleted

## 2024-02-28 ENCOUNTER — Encounter (HOSPITAL_COMMUNITY): Payer: Self-pay | Admitting: Urology

## 2024-02-28 NOTE — Telephone Encounter (Signed)
 Called patient to remind of sim appt. for 02-29-24- arrival time- 8:45 am, informed patient to arrive with a full bladder, spoke with patient and he is aware of this appt. and the instructions

## 2024-02-28 NOTE — Progress Notes (Signed)
  Radiation Oncology         [phone] ________________________________  Name: Sergio [name] MRN: [id number]  Date: 02/29/2024  DOB: 01-Apr-1951  SIMULATION AND TREATMENT PLANNING NOTE    ICD-10-CM   1. Malignant neoplasm of prostate (HCC)  C61       DIAGNOSIS:  73 y.o. gentleman with Stage T1c adenocarcinoma of the prostate with Gleason score of 3+4, and PSA of 5.26.   NARRATIVE:  The patient was brought to the CT Simulation planning suite.  Identity was confirmed.  All relevant records and images related to the planned course of therapy were reviewed.  The patient freely provided informed written consent to proceed with treatment after reviewing the details related to the planned course of therapy. The consent form was witnessed and verified by the simulation staff.  Then, the patient was set-up in a stable reproducible supine position for radiation therapy.  A vacuum lock pillow device was custom fabricated to position his legs in a reproducible immobilized position.  Then, I performed a urethrogram under sterile conditions to identify the prostatic apex.  CT images were obtained.  Surface markings were placed.  The CT images were loaded into the planning software.  Then the prostate target and avoidance structures including the rectum, bladder, bowel and hips were contoured.  Treatment planning then occurred.  The radiation prescription was entered and confirmed.  A total of one complex treatment devices was fabricated. I have requested : Intensity Modulated Radiotherapy (IMRT) is medically necessary for this case for the following reason:  Rectal sparing.Marland Kitchen  PLAN:  The patient will receive 70 Gy in 28 fractions.  ________________________________  Artist Pais Kathrynn Running, M.D.

## 2024-02-29 ENCOUNTER — Inpatient Hospital Stay

## 2024-02-29 ENCOUNTER — Inpatient Hospital Stay: Attending: Hematology and Oncology | Admitting: Hematology and Oncology

## 2024-02-29 ENCOUNTER — Ambulatory Visit: Admitting: Family

## 2024-02-29 ENCOUNTER — Ambulatory Visit
Admission: RE | Admit: 2024-02-29 | Discharge: 2024-02-29 | Disposition: A | Discharge status: Home / Self Care | Payer: Medicare HMO | Source: Ambulatory Visit | Attending: Urology | Admitting: Urology

## 2024-02-29 ENCOUNTER — Encounter: Admitting: Family

## 2024-02-29 VITALS — BP 132/84 | HR 78 | Temp 98.1°F | Resp 15 | Ht 69.0 in | Wt 170.8 lb

## 2024-02-29 DIAGNOSIS — D751 Secondary polycythemia: Secondary | ICD-10-CM | POA: Diagnosis not present

## 2024-02-29 DIAGNOSIS — Z79890 Hormone replacement therapy (postmenopausal): Secondary | ICD-10-CM | POA: Diagnosis not present

## 2024-02-29 DIAGNOSIS — C61 Malignant neoplasm of prostate: Secondary | ICD-10-CM | POA: Insufficient documentation

## 2024-02-29 LAB — CBC WITH DIFFERENTIAL (CANCER CENTER ONLY)
Abs Immature Granulocytes: 0.03 10*3/uL (ref 0.00–0.07)
Basophils Absolute: 0 10*3/uL (ref 0.0–0.1)
Basophils Relative: 1 %
Eosinophils Absolute: 0.2 10*3/uL (ref 0.0–0.5)
Eosinophils Relative: 3 %
HCT: 51.6 % (ref 39.0–52.0)
Hemoglobin: 16.4 g/dL (ref 13.0–17.0)
Immature Granulocytes: 1 %
Lymphocytes Relative: 24 %
Lymphs Abs: 1.4 10*3/uL (ref 0.7–4.0)
MCH: 22.7 pg — ABNORMAL LOW (ref 26.0–34.0)
MCHC: 31.8 g/dL (ref 30.0–36.0)
MCV: 71.3 fL — ABNORMAL LOW (ref 80.0–100.0)
Monocytes Absolute: 0.6 10*3/uL (ref 0.1–1.0)
Monocytes Relative: 11 %
Neutro Abs: 3.8 10*3/uL (ref 1.7–7.7)
Neutrophils Relative %: 60 %
Platelet Count: 165 10*3/uL (ref 150–400)
RBC: 7.24 MIL/uL — ABNORMAL HIGH (ref 4.22–5.81)
RDW: 19.9 % — ABNORMAL HIGH (ref 11.5–15.5)
WBC Count: 6.1 10*3/uL (ref 4.0–10.5)
nRBC: 0 % (ref 0.0–0.2)

## 2024-02-29 LAB — CMP (CANCER CENTER ONLY)
ALT: 18 U/L (ref 0–44)
AST: 18 U/L (ref 15–41)
Albumin: 4.3 g/dL (ref 3.5–5.0)
Alkaline Phosphatase: 47 U/L (ref 38–126)
Anion gap: 5 (ref 5–15)
BUN: 12 mg/dL (ref 8–23)
CO2: 27 mmol/L (ref 22–32)
Calcium: 9.2 mg/dL (ref 8.9–10.3)
Chloride: 100 mmol/L (ref 98–111)
Creatinine: 0.81 mg/dL (ref 0.61–1.24)
GFR, Estimated: >60 mL/min (ref >60)
Glucose, Bld: 96 mg/dL (ref 70–99)
Potassium: 3.8 mmol/L (ref 3.5–5.1)
Sodium: 132 mmol/L — ABNORMAL LOW (ref 135–145)
Total Bilirubin: 1.1 mg/dL (ref 0.0–1.2)
Total Protein: 6.9 g/dL (ref 6.5–8.1)

## 2024-02-29 NOTE — Assessment & Plan Note (Signed)
 Due to testosterone replacement therapy with androgen. Previous work-up 2019: MPN panel: Negative for Jak 2, MPL, CALR Erythropoietin 11.3, hemoglobin 19.9   Current treatment: Phlebotomies every 2 weeks started 11/11/2018 switched to monthly phlebotomies 02/21/2019 switch to every 2 months 05/22/2019, switched to every 66-month starting 11/20/2019   Lab review:  07/31/22: Hemoglobin 15.2  11/02/2022: Hemoglobin 15.3 02/05/2023: Hemoglobin 15.5, hematocrit 50.5 02/11/2024: Hemoglobin 16.2, hematocrit 53.1   Patient works as a Chartered certified accountant: Every 2 months phlebotomies at [facility].  (Goal hematocrit below 50) Return to clinic in 1 year for follow-up with labs

## 2024-02-29 NOTE — Progress Notes (Signed)
 Patient Care Team: Ngetich, Donalee Citrin, NP as PCP - General (Family Medicine) Serena Croissant, MD as Consulting Physician (Hematology and Oncology) Cherlyn Cushing, RN as Oncology Nurse Navigator  DIAGNOSIS:  Encounter Diagnosis  Name Primary?   Secondary erythrocytosis Yes    SUMMARY OF ONCOLOGIC HISTORY: Oncology History  Malignant neoplasm of prostate (HCC)  12/07/2023 Cancer Staging   Staging form: Prostate, AJCC 8th Edition - Clinical stage from 12/07/2023: Stage IIB (cT1c, cN0, cM0, PSA: 5.3, Grade Group: 2) - Signed by Marcello Fennel, PA-C on 12/30/2023 Histopathologic type: Adenocarcinoma, NOS Stage prefix: Initial diagnosis Prostate specific antigen (PSA) range: Less than 10 Gleason primary pattern: 3 Gleason secondary pattern: 4 Gleason score: 7 Histologic grading system: 5 grade system Number of biopsy cores examined: 21 Number of biopsy cores positive: 2 Location of positive needle core biopsies: One side   12/30/2023 Initial Diagnosis   Malignant neoplasm of prostate (HCC)     CHIEF COMPLIANT: Follow-up of polycythemia  HISTORY OF PRESENT ILLNESS:  History of Present Illness The patient, with a history of polycythemia, presents for a follow-up visit. He has been managing his condition with regular phlebotomies every eight to ten weeks. However, he recently decided to stop this treatment due to discomfort and inconvenience. He expresses concern about his hemoglobin levels, which have been consistently around 15, and is considering resuming phlebotomies to control his condition.  In addition to his polycythemia, the patient has been diagnosed with early-stage prostate cancer. He is scheduled to begin low-dose radiation therapy in the near future. He has been working closely with his oncologist, Dr. Kathrynn Running, who is optimistic about his prognosis due to the early detection of the cancer.  The patient also mentions that he has been donating his blood to various hospitals  around the state. He expresses a desire to continue this practice if possible.     ALLERGIES:  has no known allergies.  MEDICATIONS:  Current Outpatient Medications  Medication Sig Dispense Refill   ascorbic acid (VITAMIN C) 500 MG tablet Take 500 mg by mouth daily.     aspirin EC 81 MG tablet Take 81 mg by mouth daily. Swallow whole.     Cholecalciferol (VITAMIN D3) 10 MCG (400 UNIT) CAPS Take 400 Units by mouth daily.     fluticasone (FLONASE) 50 MCG/ACT nasal spray Place 2 sprays into both nostrils daily. (Patient taking differently: Place 2 sprays into both nostrils daily as needed for allergies or rhinitis.) 16 g 6   hydrochlorothiazide (HYDRODIURIL) 12.5 MG tablet Take 1 tablet (12.5 mg total) by mouth daily. 90 tablet 1   lisinopril (ZESTRIL) 20 MG tablet TAKE 1 TABLET BY MOUTH EVERY DAY (Patient taking differently: Take 20 mg by mouth daily.) 90 tablet 1   Multiple Vitamin (MULTIVITAMIN) tablet Take 1 tablet by mouth daily.     Polyethylene Glycol 3350 (MIRALAX PO) Take by mouth as needed.     sildenafil (REVATIO) 20 MG tablet Take 1 tablet (20 mg total) by mouth as needed. (Patient taking differently: Take 20 mg by mouth as needed (ED).) 10 tablet 0   silodosin (RAPAFLO) 8 MG CAPS capsule Take 1 capsule (8 mg total) by mouth daily with breakfast. (Patient taking differently: Take 8 mg by mouth daily with breakfast.) 30 capsule    traMADol (ULTRAM) 50 MG tablet Take 1-2 tablets (50-100 mg total) by mouth every 6 (six) hours as needed for moderate pain (pain score 4-6) or severe pain (pain score 7-10) (post-operatively). 10 tablet 0  No current facility-administered medications for this visit.    PHYSICAL EXAMINATION: ECOG PERFORMANCE STATUS: 1 - Symptomatic but completely ambulatory  Vitals:   02/29/24 1217  BP: 132/84  Pulse: 78  Resp: 15  Temp: 98.1 F (36.7 C)  SpO2: 97%   Filed Weights   02/29/24 1217  Weight: 170 lb 12.8 oz (77.5 kg)    LABORATORY DATA:  I have  reviewed the data as listed    Latest Ref Rng & Units 02/29/2024   11:20 AM 02/25/2024   11:31 AM 02/11/2024    9:41 AM  CMP  Glucose 70 - 99 mg/dL 96  96  97   BUN 8 - 23 mg/dL 12  10  11    Creatinine 0.61 - 1.24 mg/dL 1.61  0.96  0.45   Sodium 135 - 145 mmol/L 132  136  134   Potassium 3.5 - 5.1 mmol/L 3.8  3.6  4.1   Chloride 98 - 111 mmol/L 100  98  99   CO2 22 - 32 mmol/L 27   27   Calcium 8.9 - 10.3 mg/dL 9.2   9.4   Total Protein 6.5 - 8.1 g/dL 6.9   6.9   Total Bilirubin 0.0 - 1.2 mg/dL 1.1   1.6   Alkaline Phos 38 - 126 U/L 47     AST 15 - 41 U/L 18   19   ALT 0 - 44 U/L 18   21     Lab Results  Component Value Date   WBC 6.1 02/29/2024   HGB 16.4 02/29/2024   HCT 51.6 02/29/2024   MCV 71.3 (L) 02/29/2024   PLT 165 02/29/2024   NEUTROABS 3.8 02/29/2024    ASSESSMENT & PLAN:  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  He will come every 2 months to the cancer center for the upcoming year for phlebotomy treatments.  Once he is a year out from his prostate cancer therapy he can go back to [facility] for phlebotomies.  Return to clinic in 1 year for follow-up with labs ------------------------------------- Assessment and Plan Assessment & Plan Prostate cancer Early-stage prostate cancer managed by  Dr. Kathrynn Running. Radiation therapy scheduled to start next month for five and a half weeks aiming for cure. - Coordinate with Dr. Kathrynn Running regarding treatment. - Ensure follow-up with scheduled radiation therapy.  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 Current hemoglobin 16.4, hematocrit 51.6. Asymptomatic, plans to continue phlebotomy to maintain hematocrit below 50. - Schedule phlebotomy every two months. - Monitor hemoglobin and hematocrit levels regularly.      Orders Placed This Encounter  Procedures   CBC with Differential (Cancer Center Only)    Standing Status:   Standing    Number of Occurrences:   30    Expiration Date:   02/28/2025   The patient has a good understanding of the overall plan. he agrees  with it. he will call with any problems that may develop before the next visit here. Total time spent: 30 mins including face to face time and time spent for planning, charting and co-ordination of care   Tamsen Meek, MD 02/29/24

## 2024-03-03 ENCOUNTER — Telehealth: Payer: Self-pay | Admitting: Hematology and Oncology

## 2024-03-03 DIAGNOSIS — C61 Malignant neoplasm of prostate: Secondary | ICD-10-CM | POA: Diagnosis not present

## 2024-03-03 NOTE — Telephone Encounter (Signed)
 Scheduled appointments per 3/25 los. Talked with the patient and he is aware of all made appointments.

## 2024-03-05 NOTE — Progress Notes (Signed)
   This encounter was created in error - please disregard. No show

## 2024-03-09 ENCOUNTER — Ambulatory Visit

## 2024-03-09 ENCOUNTER — Ambulatory Visit
Admission: RE | Admit: 2024-03-09 | Discharge: 2024-03-09 | Disposition: A | Discharge status: Home / Self Care | Source: Ambulatory Visit | Attending: Radiation Oncology | Admitting: Radiation Oncology

## 2024-03-09 ENCOUNTER — Other Ambulatory Visit: Payer: Self-pay

## 2024-03-09 DIAGNOSIS — C61 Malignant neoplasm of prostate: Secondary | ICD-10-CM | POA: Diagnosis present

## 2024-03-09 LAB — RAD ONC ARIA SESSION SUMMARY
Course Elapsed Days: 0
Plan Fractions Treated to Date: 1
Plan Prescribed Dose Per Fraction: 2.5 Gy
Plan Total Fractions Prescribed: 28
Plan Total Prescribed Dose: 70 Gy
Reference Point Dosage Given to Date: 2.5 Gy
Reference Point Session Dosage Given: 2.5 Gy
Session Number: 1

## 2024-03-10 ENCOUNTER — Other Ambulatory Visit: Payer: Self-pay

## 2024-03-10 ENCOUNTER — Ambulatory Visit
Admission: RE | Admit: 2024-03-10 | Discharge: 2024-03-10 | Disposition: A | Discharge status: Home / Self Care | Source: Ambulatory Visit | Attending: Radiation Oncology | Admitting: Radiation Oncology

## 2024-03-10 ENCOUNTER — Ambulatory Visit

## 2024-03-10 DIAGNOSIS — C61 Malignant neoplasm of prostate: Secondary | ICD-10-CM | POA: Diagnosis not present

## 2024-03-10 LAB — RAD ONC ARIA SESSION SUMMARY
Course Elapsed Days: 1
Plan Fractions Treated to Date: 2
Plan Prescribed Dose Per Fraction: 2.5 Gy
Plan Total Fractions Prescribed: 28
Plan Total Prescribed Dose: 70 Gy
Reference Point Dosage Given to Date: 5 Gy
Reference Point Session Dosage Given: 2.5 Gy
Session Number: 2

## 2024-03-13 ENCOUNTER — Ambulatory Visit
Admission: RE | Admit: 2024-03-13 | Discharge: 2024-03-13 | Disposition: A | Discharge status: Home / Self Care | Source: Ambulatory Visit | Attending: Radiation Oncology | Admitting: Radiation Oncology

## 2024-03-13 ENCOUNTER — Other Ambulatory Visit: Payer: Self-pay

## 2024-03-13 DIAGNOSIS — C61 Malignant neoplasm of prostate: Secondary | ICD-10-CM | POA: Diagnosis not present

## 2024-03-13 LAB — RAD ONC ARIA SESSION SUMMARY
Course Elapsed Days: 4
Plan Fractions Treated to Date: 3
Plan Prescribed Dose Per Fraction: 2.5 Gy
Plan Total Fractions Prescribed: 28
Plan Total Prescribed Dose: 70 Gy
Reference Point Dosage Given to Date: 7.5 Gy
Reference Point Session Dosage Given: 2.5 Gy
Session Number: 3

## 2024-03-14 ENCOUNTER — Other Ambulatory Visit: Payer: Self-pay

## 2024-03-14 ENCOUNTER — Ambulatory Visit
Admission: RE | Admit: 2024-03-14 | Discharge: 2024-03-14 | Disposition: A | Discharge status: Home / Self Care | Source: Ambulatory Visit | Attending: Radiation Oncology | Admitting: Radiation Oncology

## 2024-03-14 DIAGNOSIS — C61 Malignant neoplasm of prostate: Secondary | ICD-10-CM | POA: Diagnosis not present

## 2024-03-14 LAB — RAD ONC ARIA SESSION SUMMARY
Course Elapsed Days: 5
Plan Fractions Treated to Date: 4
Plan Prescribed Dose Per Fraction: 2.5 Gy
Plan Total Fractions Prescribed: 28
Plan Total Prescribed Dose: 70 Gy
Reference Point Dosage Given to Date: 10 Gy
Reference Point Session Dosage Given: 2.5 Gy
Session Number: 4

## 2024-03-15 ENCOUNTER — Ambulatory Visit
Admission: RE | Admit: 2024-03-15 | Discharge: 2024-03-15 | Disposition: A | Discharge status: Home / Self Care | Source: Ambulatory Visit | Attending: Radiation Oncology

## 2024-03-15 ENCOUNTER — Other Ambulatory Visit: Payer: Self-pay

## 2024-03-15 DIAGNOSIS — C61 Malignant neoplasm of prostate: Secondary | ICD-10-CM | POA: Diagnosis not present

## 2024-03-15 LAB — RAD ONC ARIA SESSION SUMMARY
Course Elapsed Days: 6
Plan Fractions Treated to Date: 5
Plan Prescribed Dose Per Fraction: 2.5 Gy
Plan Total Fractions Prescribed: 28
Plan Total Prescribed Dose: 70 Gy
Reference Point Dosage Given to Date: 12.5 Gy
Reference Point Session Dosage Given: 2.5 Gy
Session Number: 5

## 2024-03-16 ENCOUNTER — Ambulatory Visit
Admission: RE | Admit: 2024-03-16 | Discharge: 2024-03-16 | Disposition: A | Discharge status: Home / Self Care | Source: Ambulatory Visit | Attending: Radiation Oncology | Admitting: Radiation Oncology

## 2024-03-16 ENCOUNTER — Other Ambulatory Visit: Payer: Self-pay

## 2024-03-16 DIAGNOSIS — C61 Malignant neoplasm of prostate: Secondary | ICD-10-CM | POA: Diagnosis not present

## 2024-03-16 LAB — RAD ONC ARIA SESSION SUMMARY
Course Elapsed Days: 7
Plan Fractions Treated to Date: 6
Plan Prescribed Dose Per Fraction: 2.5 Gy
Plan Total Fractions Prescribed: 28
Plan Total Prescribed Dose: 70 Gy
Reference Point Dosage Given to Date: 15 Gy
Reference Point Session Dosage Given: 2.5 Gy
Session Number: 6

## 2024-03-17 ENCOUNTER — Ambulatory Visit
Admission: RE | Admit: 2024-03-17 | Discharge: 2024-03-17 | Disposition: A | Discharge status: Home / Self Care | Source: Ambulatory Visit | Attending: Radiation Oncology | Admitting: Radiation Oncology

## 2024-03-17 ENCOUNTER — Other Ambulatory Visit: Payer: Self-pay

## 2024-03-17 DIAGNOSIS — C61 Malignant neoplasm of prostate: Secondary | ICD-10-CM | POA: Diagnosis not present

## 2024-03-17 LAB — RAD ONC ARIA SESSION SUMMARY
Course Elapsed Days: 8
Plan Fractions Treated to Date: 7
Plan Prescribed Dose Per Fraction: 2.5 Gy
Plan Total Fractions Prescribed: 28
Plan Total Prescribed Dose: 70 Gy
Reference Point Dosage Given to Date: 17.5 Gy
Reference Point Session Dosage Given: 2.5 Gy
Session Number: 7

## 2024-03-19 ENCOUNTER — Ambulatory Visit: Payer: Self-pay

## 2024-03-20 ENCOUNTER — Ambulatory Visit
Admission: RE | Admit: 2024-03-20 | Discharge: 2024-03-20 | Disposition: A | Discharge status: Home / Self Care | Source: Ambulatory Visit | Attending: Radiation Oncology | Admitting: Radiation Oncology

## 2024-03-20 ENCOUNTER — Other Ambulatory Visit: Payer: Self-pay

## 2024-03-20 DIAGNOSIS — C61 Malignant neoplasm of prostate: Secondary | ICD-10-CM | POA: Diagnosis not present

## 2024-03-20 LAB — RAD ONC ARIA SESSION SUMMARY
Course Elapsed Days: 11
Plan Fractions Treated to Date: 8
Plan Prescribed Dose Per Fraction: 2.5 Gy
Plan Total Fractions Prescribed: 28
Plan Total Prescribed Dose: 70 Gy
Reference Point Dosage Given to Date: 20 Gy
Reference Point Session Dosage Given: 2.5 Gy
Session Number: 8

## 2024-03-21 ENCOUNTER — Ambulatory Visit
Admission: RE | Admit: 2024-03-21 | Discharge: 2024-03-21 | Disposition: A | Discharge status: Home / Self Care | Source: Ambulatory Visit | Attending: Radiation Oncology | Admitting: Radiation Oncology

## 2024-03-21 ENCOUNTER — Other Ambulatory Visit: Payer: Medicare HMO

## 2024-03-21 ENCOUNTER — Other Ambulatory Visit

## 2024-03-21 ENCOUNTER — Other Ambulatory Visit: Payer: Self-pay

## 2024-03-21 DIAGNOSIS — C61 Malignant neoplasm of prostate: Secondary | ICD-10-CM | POA: Diagnosis not present

## 2024-03-21 LAB — RAD ONC ARIA SESSION SUMMARY
Course Elapsed Days: 12
Plan Fractions Treated to Date: 9
Plan Prescribed Dose Per Fraction: 2.5 Gy
Plan Total Fractions Prescribed: 28
Plan Total Prescribed Dose: 70 Gy
Reference Point Dosage Given to Date: 22.5 Gy
Reference Point Session Dosage Given: 2.5 Gy
Session Number: 9

## 2024-03-22 ENCOUNTER — Other Ambulatory Visit: Payer: Self-pay

## 2024-03-22 ENCOUNTER — Ambulatory Visit
Admission: RE | Admit: 2024-03-22 | Discharge: 2024-03-22 | Disposition: A | Discharge status: Home / Self Care | Source: Ambulatory Visit | Attending: Radiation Oncology | Admitting: Radiation Oncology

## 2024-03-22 DIAGNOSIS — C61 Malignant neoplasm of prostate: Secondary | ICD-10-CM | POA: Diagnosis not present

## 2024-03-22 LAB — RAD ONC ARIA SESSION SUMMARY
Course Elapsed Days: 13
Plan Fractions Treated to Date: 10
Plan Prescribed Dose Per Fraction: 2.5 Gy
Plan Total Fractions Prescribed: 28
Plan Total Prescribed Dose: 70 Gy
Reference Point Dosage Given to Date: 25 Gy
Reference Point Session Dosage Given: 2.5 Gy
Session Number: 10

## 2024-03-23 ENCOUNTER — Ambulatory Visit
Admission: RE | Admit: 2024-03-23 | Discharge: 2024-03-23 | Disposition: A | Discharge status: Home / Self Care | Source: Ambulatory Visit | Attending: Radiation Oncology | Admitting: Radiation Oncology

## 2024-03-23 ENCOUNTER — Other Ambulatory Visit: Payer: Self-pay

## 2024-03-23 DIAGNOSIS — C61 Malignant neoplasm of prostate: Secondary | ICD-10-CM | POA: Diagnosis not present

## 2024-03-23 LAB — RAD ONC ARIA SESSION SUMMARY
Course Elapsed Days: 14
Plan Fractions Treated to Date: 11
Plan Prescribed Dose Per Fraction: 2.5 Gy
Plan Total Fractions Prescribed: 28
Plan Total Prescribed Dose: 70 Gy
Reference Point Dosage Given to Date: 27.5 Gy
Reference Point Session Dosage Given: 2.5 Gy
Session Number: 11

## 2024-03-24 ENCOUNTER — Ambulatory Visit
Admission: RE | Admit: 2024-03-24 | Discharge: 2024-03-24 | Disposition: A | Discharge status: Home / Self Care | Source: Ambulatory Visit | Attending: Radiation Oncology | Admitting: Radiation Oncology

## 2024-03-24 ENCOUNTER — Other Ambulatory Visit: Payer: Self-pay | Admitting: Radiation Oncology

## 2024-03-24 ENCOUNTER — Ambulatory Visit: Payer: Medicare HMO | Admitting: Family

## 2024-03-24 ENCOUNTER — Other Ambulatory Visit: Payer: Self-pay

## 2024-03-24 DIAGNOSIS — C61 Malignant neoplasm of prostate: Secondary | ICD-10-CM | POA: Diagnosis not present

## 2024-03-24 LAB — RAD ONC ARIA SESSION SUMMARY
Course Elapsed Days: 15
Plan Fractions Treated to Date: 12
Plan Prescribed Dose Per Fraction: 2.5 Gy
Plan Total Fractions Prescribed: 28
Plan Total Prescribed Dose: 70 Gy
Reference Point Dosage Given to Date: 30 Gy
Reference Point Session Dosage Given: 2.5 Gy
Session Number: 12

## 2024-03-24 MED ORDER — PHENAZOPYRIDINE HCL 200 MG PO TABS: 200.0000 mg | ORAL_TABLET | Freq: Three times a day (TID) | ORAL | 5 refills | Status: AC | PRN

## 2024-03-27 ENCOUNTER — Other Ambulatory Visit: Payer: Self-pay

## 2024-03-27 ENCOUNTER — Ambulatory Visit
Admission: RE | Admit: 2024-03-27 | Discharge: 2024-03-27 | Disposition: A | Discharge status: Home / Self Care | Source: Ambulatory Visit | Attending: Radiation Oncology | Admitting: Radiation Oncology

## 2024-03-27 DIAGNOSIS — C61 Malignant neoplasm of prostate: Secondary | ICD-10-CM | POA: Diagnosis not present

## 2024-03-27 LAB — RAD ONC ARIA SESSION SUMMARY
Course Elapsed Days: 18
Plan Fractions Treated to Date: 13
Plan Prescribed Dose Per Fraction: 2.5 Gy
Plan Total Fractions Prescribed: 28
Plan Total Prescribed Dose: 70 Gy
Reference Point Dosage Given to Date: 32.5 Gy
Reference Point Session Dosage Given: 2.5 Gy
Session Number: 13

## 2024-03-28 ENCOUNTER — Other Ambulatory Visit: Payer: Self-pay

## 2024-03-28 ENCOUNTER — Other Ambulatory Visit

## 2024-03-28 ENCOUNTER — Ambulatory Visit
Admission: RE | Admit: 2024-03-28 | Discharge: 2024-03-28 | Disposition: A | Discharge status: Home / Self Care | Source: Ambulatory Visit | Attending: Radiation Oncology | Admitting: Radiation Oncology

## 2024-03-28 DIAGNOSIS — I10 Essential (primary) hypertension: Secondary | ICD-10-CM

## 2024-03-28 DIAGNOSIS — C61 Malignant neoplasm of prostate: Secondary | ICD-10-CM | POA: Diagnosis not present

## 2024-03-28 DIAGNOSIS — E785 Hyperlipidemia, unspecified: Secondary | ICD-10-CM

## 2024-03-28 LAB — RAD ONC ARIA SESSION SUMMARY
Course Elapsed Days: 19
Plan Fractions Treated to Date: 14
Plan Prescribed Dose Per Fraction: 2.5 Gy
Plan Total Fractions Prescribed: 28
Plan Total Prescribed Dose: 70 Gy
Reference Point Dosage Given to Date: 35 Gy
Reference Point Session Dosage Given: 2.5 Gy
Session Number: 14

## 2024-03-29 ENCOUNTER — Ambulatory Visit
Admission: RE | Admit: 2024-03-29 | Discharge: 2024-03-29 | Disposition: A | Discharge status: Home / Self Care | Source: Ambulatory Visit | Attending: Radiation Oncology

## 2024-03-29 ENCOUNTER — Other Ambulatory Visit: Payer: Self-pay

## 2024-03-29 DIAGNOSIS — C61 Malignant neoplasm of prostate: Secondary | ICD-10-CM | POA: Diagnosis not present

## 2024-03-29 LAB — RAD ONC ARIA SESSION SUMMARY
Course Elapsed Days: 20
Plan Fractions Treated to Date: 15
Plan Prescribed Dose Per Fraction: 2.5 Gy
Plan Total Fractions Prescribed: 28
Plan Total Prescribed Dose: 70 Gy
Reference Point Dosage Given to Date: 37.5 Gy
Reference Point Session Dosage Given: 2.5 Gy
Session Number: 15

## 2024-03-29 LAB — CBC WITH DIFFERENTIAL/PLATELET
Absolute Lymphocytes: 567 {cells}/uL — ABNORMAL LOW (ref 850–3900)
Absolute Monocytes: 476 {cells}/uL (ref 200–950)
Basophils Absolute: 43 {cells}/uL (ref 0–200)
Basophils Relative: 0.7 %
Eosinophils Absolute: 122 {cells}/uL (ref 15–500)
Eosinophils Relative: 2 %
HCT: 50.3 % — ABNORMAL HIGH (ref 38.5–50.0)
Hemoglobin: 16.1 g/dL (ref 13.2–17.1)
MCH: 23.4 pg — ABNORMAL LOW (ref 27.0–33.0)
MCHC: 32 g/dL (ref 32.0–36.0)
MCV: 73.1 fL — ABNORMAL LOW (ref 80.0–100.0)
MPV: 10.6 fL (ref 7.5–12.5)
Monocytes Relative: 7.8 %
Neutro Abs: 4892 {cells}/uL (ref 1500–7800)
Neutrophils Relative %: 80.2 %
Platelets: 167 10*3/uL (ref 140–400)
RBC: 6.88 10*6/uL — ABNORMAL HIGH (ref 4.20–5.80)
RDW: 20.6 % — ABNORMAL HIGH (ref 11.0–15.0)
Total Lymphocyte: 9.3 %
WBC: 6.1 10*3/uL (ref 3.8–10.8)

## 2024-03-29 LAB — COMPLETE METABOLIC PANEL WITHOUT GFR
AG Ratio: 1.7 (calc) (ref 1.0–2.5)
ALT: 16 U/L (ref 9–46)
AST: 16 U/L (ref 10–35)
Albumin: 4 g/dL (ref 3.6–5.1)
Alkaline phosphatase (APISO): 52 U/L (ref 35–144)
BUN: 13 mg/dL (ref 7–25)
CO2: 23 mmol/L (ref 20–32)
Calcium: 9.1 mg/dL (ref 8.6–10.3)
Chloride: 95 mmol/L — ABNORMAL LOW (ref 98–110)
Creat: 0.76 mg/dL (ref 0.70–1.28)
Globulin: 2.3 g/dL (ref 1.9–3.7)
Glucose, Bld: 98 mg/dL (ref 65–99)
Potassium: 4.1 mmol/L (ref 3.5–5.3)
Sodium: 128 mmol/L — ABNORMAL LOW (ref 135–146)
Total Bilirubin: 1 mg/dL (ref 0.2–1.2)
Total Protein: 6.3 g/dL (ref 6.1–8.1)

## 2024-03-29 LAB — LIPID PANEL
Cholesterol: 126 mg/dL (ref <200)
HDL: 43 mg/dL (ref >=40)
LDL Cholesterol (Calc): 69 mg/dL
Non-HDL Cholesterol (Calc): 83 mg/dL (ref <130)
Total CHOL/HDL Ratio: 2.9 (calc) (ref <5.0)
Triglycerides: 65 mg/dL (ref <150)

## 2024-03-29 LAB — TSH: TSH: 1.11 m[IU]/L (ref 0.40–4.50)

## 2024-03-30 ENCOUNTER — Ambulatory Visit
Admission: RE | Admit: 2024-03-30 | Discharge: 2024-03-30 | Discharge status: Home / Self Care | Source: Ambulatory Visit | Attending: Radiation Oncology

## 2024-03-30 ENCOUNTER — Other Ambulatory Visit: Payer: Self-pay

## 2024-03-30 ENCOUNTER — Ambulatory Visit
Admission: RE | Admit: 2024-03-30 | Discharge: 2024-03-30 | Disposition: A | Discharge status: Home / Self Care | Source: Ambulatory Visit | Attending: Radiation Oncology | Admitting: Radiation Oncology

## 2024-03-30 DIAGNOSIS — C61 Malignant neoplasm of prostate: Secondary | ICD-10-CM | POA: Diagnosis not present

## 2024-03-30 LAB — RAD ONC ARIA SESSION SUMMARY
Course Elapsed Days: 21
Plan Fractions Treated to Date: 16
Plan Prescribed Dose Per Fraction: 2.5 Gy
Plan Total Fractions Prescribed: 28
Plan Total Prescribed Dose: 70 Gy
Reference Point Dosage Given to Date: 40 Gy
Reference Point Session Dosage Given: 2.5 Gy
Session Number: 16

## 2024-03-31 ENCOUNTER — Ambulatory Visit
Admission: RE | Admit: 2024-03-31 | Discharge: 2024-03-31 | Disposition: A | Discharge status: Home / Self Care | Source: Ambulatory Visit | Attending: Radiation Oncology | Admitting: Radiation Oncology

## 2024-03-31 ENCOUNTER — Other Ambulatory Visit: Payer: Self-pay

## 2024-03-31 DIAGNOSIS — C61 Malignant neoplasm of prostate: Secondary | ICD-10-CM | POA: Diagnosis not present

## 2024-03-31 LAB — RAD ONC ARIA SESSION SUMMARY
Course Elapsed Days: 22
Plan Fractions Treated to Date: 17
Plan Prescribed Dose Per Fraction: 2.5 Gy
Plan Total Fractions Prescribed: 28
Plan Total Prescribed Dose: 70 Gy
Reference Point Dosage Given to Date: 42.5 Gy
Reference Point Session Dosage Given: 2.5 Gy
Session Number: 17

## 2024-04-03 ENCOUNTER — Ambulatory Visit
Admission: RE | Admit: 2024-04-03 | Discharge: 2024-04-03 | Disposition: A | Discharge status: Home / Self Care | Source: Ambulatory Visit | Attending: Radiation Oncology | Admitting: Radiation Oncology

## 2024-04-03 ENCOUNTER — Other Ambulatory Visit: Payer: Self-pay

## 2024-04-03 DIAGNOSIS — C61 Malignant neoplasm of prostate: Secondary | ICD-10-CM | POA: Diagnosis not present

## 2024-04-03 LAB — RAD ONC ARIA SESSION SUMMARY
Course Elapsed Days: 25
Plan Fractions Treated to Date: 18
Plan Prescribed Dose Per Fraction: 2.5 Gy
Plan Total Fractions Prescribed: 28
Plan Total Prescribed Dose: 70 Gy
Reference Point Dosage Given to Date: 45 Gy
Reference Point Session Dosage Given: 2.5 Gy
Session Number: 18

## 2024-04-04 ENCOUNTER — Other Ambulatory Visit: Payer: Self-pay

## 2024-04-04 ENCOUNTER — Telehealth: Payer: Self-pay

## 2024-04-04 ENCOUNTER — Ambulatory Visit
Admission: RE | Admit: 2024-04-04 | Discharge: 2024-04-04 | Disposition: A | Discharge status: Home / Self Care | Source: Ambulatory Visit | Attending: Radiation Oncology

## 2024-04-04 DIAGNOSIS — C61 Malignant neoplasm of prostate: Secondary | ICD-10-CM | POA: Diagnosis not present

## 2024-04-04 LAB — RAD ONC ARIA SESSION SUMMARY
Course Elapsed Days: 26
Plan Fractions Treated to Date: 19
Plan Prescribed Dose Per Fraction: 2.5 Gy
Plan Total Fractions Prescribed: 28
Plan Total Prescribed Dose: 70 Gy
Reference Point Dosage Given to Date: 47.5 Gy
Reference Point Session Dosage Given: 2.5 Gy
Session Number: 19

## 2024-04-04 NOTE — Telephone Encounter (Signed)
 RN called [name] back to follow-up complaints of not being able to empty bladder possible urine retention.  [name] reports onset was yesterday after treatment  on into this morning.  He called [facility] got in to see Dr. Freddi Jaeger they drained 600 cc of urine from his bladder and left catheter in for now.  [name] will return to [facility] on 04/10/2024 to have it removed.   [name] did say he will be in for his radiation treatment this afternoon at 4:30 pm.

## 2024-04-05 ENCOUNTER — Telehealth: Payer: Self-pay

## 2024-04-05 ENCOUNTER — Ambulatory Visit
Admission: RE | Admit: 2024-04-05 | Discharge: 2024-04-05 | Disposition: A | Discharge status: Home / Self Care | Source: Ambulatory Visit | Attending: Radiation Oncology

## 2024-04-05 ENCOUNTER — Other Ambulatory Visit: Payer: Self-pay

## 2024-04-05 DIAGNOSIS — C61 Malignant neoplasm of prostate: Secondary | ICD-10-CM | POA: Diagnosis not present

## 2024-04-05 LAB — RAD ONC ARIA SESSION SUMMARY
Course Elapsed Days: 27
Plan Fractions Treated to Date: 20
Plan Prescribed Dose Per Fraction: 2.5 Gy
Plan Total Fractions Prescribed: 28
Plan Total Prescribed Dose: 70 Gy
Reference Point Dosage Given to Date: 50 Gy
Reference Point Session Dosage Given: 2.5 Gy
Session Number: 20

## 2024-04-05 NOTE — Telephone Encounter (Signed)
-----   Message from Elijio Guadeloupe Ngetich sent at 04/05/2024 11:33 AM EDT ----- - Camilo Cella blood cells,glucose,kidney and liver function are all within normal range.  - Sodium level is lower than previous level.Drink Gatorade one bottle daily then follow up in 2 weeks to recheck BMP  - Hemoglobin and indicies remain stable.

## 2024-04-05 NOTE — Telephone Encounter (Signed)
 I left a message for the patient to return my call.

## 2024-04-06 ENCOUNTER — Ambulatory Visit
Admission: RE | Admit: 2024-04-06 | Discharge: 2024-04-06 | Disposition: A | Discharge status: Home / Self Care | Source: Ambulatory Visit | Attending: Radiation Oncology | Admitting: Radiation Oncology

## 2024-04-06 ENCOUNTER — Ambulatory Visit

## 2024-04-06 ENCOUNTER — Other Ambulatory Visit: Payer: Self-pay

## 2024-04-06 DIAGNOSIS — C61 Malignant neoplasm of prostate: Secondary | ICD-10-CM | POA: Diagnosis present

## 2024-04-06 LAB — RAD ONC ARIA SESSION SUMMARY
Course Elapsed Days: 28
Plan Fractions Treated to Date: 21
Plan Prescribed Dose Per Fraction: 2.5 Gy
Plan Total Fractions Prescribed: 28
Plan Total Prescribed Dose: 70 Gy
Reference Point Dosage Given to Date: 52.5 Gy
Reference Point Session Dosage Given: 2.5 Gy
Session Number: 21

## 2024-04-06 NOTE — Telephone Encounter (Signed)
 Copied from CRM [id number]. Topic: Clinical - Lab/Test Results >> Apr 05, 2024  4:54 PM Karole Pacer C wrote: Reason for CRM: Patient is returning a missed call to discuss lab results, patient would like a call back to discuss in detail as he reviewed them on mychart.

## 2024-04-06 NOTE — Telephone Encounter (Signed)
 Results discussed. View previous encounter.

## 2024-04-07 ENCOUNTER — Other Ambulatory Visit: Payer: Self-pay

## 2024-04-07 ENCOUNTER — Ambulatory Visit
Admission: RE | Admit: 2024-04-07 | Discharge: 2024-04-07 | Disposition: A | Discharge status: Home / Self Care | Source: Ambulatory Visit | Attending: Radiation Oncology | Admitting: Radiation Oncology

## 2024-04-07 DIAGNOSIS — C61 Malignant neoplasm of prostate: Secondary | ICD-10-CM | POA: Diagnosis not present

## 2024-04-07 LAB — RAD ONC ARIA SESSION SUMMARY
Course Elapsed Days: 29
Plan Fractions Treated to Date: 22
Plan Prescribed Dose Per Fraction: 2.5 Gy
Plan Total Fractions Prescribed: 28
Plan Total Prescribed Dose: 70 Gy
Reference Point Dosage Given to Date: 55 Gy
Reference Point Session Dosage Given: 2.5 Gy
Session Number: 22

## 2024-04-10 ENCOUNTER — Ambulatory Visit
Admission: RE | Admit: 2024-04-10 | Discharge: 2024-04-10 | Disposition: A | Discharge status: Home / Self Care | Source: Ambulatory Visit | Attending: Radiation Oncology

## 2024-04-10 ENCOUNTER — Other Ambulatory Visit: Payer: Self-pay

## 2024-04-10 DIAGNOSIS — C61 Malignant neoplasm of prostate: Secondary | ICD-10-CM | POA: Diagnosis not present

## 2024-04-10 LAB — RAD ONC ARIA SESSION SUMMARY
Course Elapsed Days: 32
Plan Fractions Treated to Date: 23
Plan Prescribed Dose Per Fraction: 2.5 Gy
Plan Total Fractions Prescribed: 28
Plan Total Prescribed Dose: 70 Gy
Reference Point Dosage Given to Date: 57.5 Gy
Reference Point Session Dosage Given: 2.5 Gy
Session Number: 23

## 2024-04-11 ENCOUNTER — Other Ambulatory Visit: Payer: Self-pay

## 2024-04-11 ENCOUNTER — Ambulatory Visit
Admission: RE | Admit: 2024-04-11 | Discharge: 2024-04-11 | Disposition: A | Discharge status: Home / Self Care | Source: Ambulatory Visit | Attending: Radiation Oncology | Admitting: Radiation Oncology

## 2024-04-11 DIAGNOSIS — C61 Malignant neoplasm of prostate: Secondary | ICD-10-CM | POA: Diagnosis not present

## 2024-04-11 LAB — RAD ONC ARIA SESSION SUMMARY
Course Elapsed Days: 33
Plan Fractions Treated to Date: 24
Plan Prescribed Dose Per Fraction: 2.5 Gy
Plan Total Fractions Prescribed: 28
Plan Total Prescribed Dose: 70 Gy
Reference Point Dosage Given to Date: 60 Gy
Reference Point Session Dosage Given: 2.5 Gy
Session Number: 24

## 2024-04-12 ENCOUNTER — Ambulatory Visit
Admission: RE | Admit: 2024-04-12 | Discharge: 2024-04-12 | Disposition: A | Discharge status: Home / Self Care | Source: Ambulatory Visit | Attending: Radiation Oncology

## 2024-04-12 ENCOUNTER — Other Ambulatory Visit: Payer: Self-pay

## 2024-04-12 DIAGNOSIS — C61 Malignant neoplasm of prostate: Secondary | ICD-10-CM | POA: Diagnosis not present

## 2024-04-12 LAB — RAD ONC ARIA SESSION SUMMARY
Course Elapsed Days: 34
Plan Fractions Treated to Date: 25
Plan Prescribed Dose Per Fraction: 2.5 Gy
Plan Total Fractions Prescribed: 28
Plan Total Prescribed Dose: 70 Gy
Reference Point Dosage Given to Date: 62.5 Gy
Reference Point Session Dosage Given: 2.5 Gy
Session Number: 25

## 2024-04-13 ENCOUNTER — Ambulatory Visit
Admission: RE | Admit: 2024-04-13 | Discharge: 2024-04-13 | Disposition: A | Discharge status: Home / Self Care | Source: Ambulatory Visit | Attending: Radiation Oncology | Admitting: Radiation Oncology

## 2024-04-13 ENCOUNTER — Other Ambulatory Visit: Payer: Self-pay

## 2024-04-13 DIAGNOSIS — C61 Malignant neoplasm of prostate: Secondary | ICD-10-CM | POA: Diagnosis not present

## 2024-04-13 LAB — RAD ONC ARIA SESSION SUMMARY
Course Elapsed Days: 35
Plan Fractions Treated to Date: 26
Plan Prescribed Dose Per Fraction: 2.5 Gy
Plan Total Fractions Prescribed: 28
Plan Total Prescribed Dose: 70 Gy
Reference Point Dosage Given to Date: 65 Gy
Reference Point Session Dosage Given: 2.5 Gy
Session Number: 26

## 2024-04-13 NOTE — Progress Notes (Signed)
 RN spoke with patient, patient followed up with urology on 5/7 due to concerns of possible urinary retention.  Patient was assessed and was prescribed antibiotics for UTI.  Reports feeling much improvement today, and will continue antibiotic therapy.  Will have final radiation treatment on 5/12.

## 2024-04-14 ENCOUNTER — Other Ambulatory Visit: Payer: Self-pay

## 2024-04-14 ENCOUNTER — Ambulatory Visit
Admission: RE | Admit: 2024-04-14 | Discharge: 2024-04-14 | Disposition: A | Discharge status: Home / Self Care | Source: Ambulatory Visit | Attending: Radiation Oncology

## 2024-04-14 DIAGNOSIS — C61 Malignant neoplasm of prostate: Secondary | ICD-10-CM | POA: Diagnosis not present

## 2024-04-14 LAB — RAD ONC ARIA SESSION SUMMARY
Course Elapsed Days: 36
Plan Fractions Treated to Date: 27
Plan Prescribed Dose Per Fraction: 2.5 Gy
Plan Total Fractions Prescribed: 28
Plan Total Prescribed Dose: 70 Gy
Reference Point Dosage Given to Date: 67.5 Gy
Reference Point Session Dosage Given: 2.5 Gy
Session Number: 27

## 2024-04-17 ENCOUNTER — Ambulatory Visit
Admission: RE | Admit: 2024-04-17 | Discharge: 2024-04-17 | Disposition: A | Discharge status: Home / Self Care | Source: Ambulatory Visit | Attending: Radiation Oncology | Admitting: Radiation Oncology

## 2024-04-17 ENCOUNTER — Other Ambulatory Visit: Payer: Self-pay

## 2024-04-17 DIAGNOSIS — C61 Malignant neoplasm of prostate: Secondary | ICD-10-CM | POA: Diagnosis not present

## 2024-04-17 LAB — RAD ONC ARIA SESSION SUMMARY
Course Elapsed Days: 39
Plan Fractions Treated to Date: 28
Plan Prescribed Dose Per Fraction: 2.5 Gy
Plan Total Fractions Prescribed: 28
Plan Total Prescribed Dose: 70 Gy
Reference Point Dosage Given to Date: 70 Gy
Reference Point Session Dosage Given: 2.5 Gy
Session Number: 28

## 2024-04-18 NOTE — Radiation Completion Notes (Addendum)
  Radiation Oncology         [phone] ________________________________  Name: Sergio [name] MRN: [id number]  Date: 04/17/2024  DOB: 1951/03/28  Referring Physician: Doy Gene, M.D. Date of Service: 2024-04-18 Radiation Oncologist: Bartholome Ligas, M.D. [facility] [place]     RADIATION ONCOLOGY END OF TREATMENT NOTE     Diagnosis: 73 y.o. gentleman with Stage T1c adenocarcinoma of the prostate with Gleason score of 3+4, and PSA of 5.26   Intent: Curative     ==========DELIVERED PLANS==========  First Treatment Date: 2024-03-09 Last Treatment Date: 2024-04-17   Plan Name: Prostate Site: Prostate Technique: IMRT Mode: Photon Dose Per Fraction: 2.5 Gy Prescribed Dose (Delivered / Prescribed): 70 Gy / 70 Gy Prescribed Fxs (Delivered / Prescribed): 28 / 28     ==========ON TREATMENT VISIT DATES========== 2024-03-10, 2024-03-17, 2024-03-24, 2024-03-30, 2024-04-06, 2024-04-13   See weekly On Treatment Notes in Epic for details in the Media tab (listed as Progress notes on the On Treatment Visit Dates listed above).  He tolerated the daily radiation treatments well with only modest fatigue and mild increased LUTS.  The patient will receive a call in about one month from the radiation oncology department. He will continue follow up with Dr. Secundino Dach as well.  ------------------------------------------------   Kenith Payer, MD [facility]  Radiation Oncology Direct Dial: [phone]  Fax: [phone] [email or web address]  Skype  [email or web address]

## 2024-04-24 NOTE — Progress Notes (Signed)
 Patient was a RadOnc Consult on 12/30/23 for his Stage T1c adenocarcinoma of the prostate with Gleason score of 3+4, and PSA of 5.26.  Patient proceed with treatment recommendations of 5.5 weeks of daily external beam radiation and had his final radiation treatment on 04/17/24.   Patient is scheduled for a post treatment nurse call on 05/16/24 and has his first post treatment PSA on 06/28/24 at [facility].     RN spoke with patient and provided education on post treatment PSA monitoring.  All questions answered, no additional needs at this time.

## 2024-04-28 ENCOUNTER — Inpatient Hospital Stay

## 2024-04-28 ENCOUNTER — Inpatient Hospital Stay: Attending: Hematology and Oncology

## 2024-04-28 DIAGNOSIS — D751 Secondary polycythemia: Secondary | ICD-10-CM | POA: Diagnosis present

## 2024-04-28 LAB — CBC WITH DIFFERENTIAL (CANCER CENTER ONLY)
Abs Immature Granulocytes: 0.02 10*3/uL (ref 0.00–0.07)
Basophils Absolute: 0 10*3/uL (ref 0.0–0.1)
Basophils Relative: 0 %
Eosinophils Absolute: 0.1 10*3/uL (ref 0.0–0.5)
Eosinophils Relative: 3 %
HCT: 47.5 % (ref 39.0–52.0)
Hemoglobin: 16.4 g/dL (ref 13.0–17.0)
Immature Granulocytes: 0 %
Lymphocytes Relative: 14 %
Lymphs Abs: 0.6 10*3/uL — ABNORMAL LOW (ref 0.7–4.0)
MCH: 24.2 pg — ABNORMAL LOW (ref 26.0–34.0)
MCHC: 34.5 g/dL (ref 30.0–36.0)
MCV: 70.2 fL — ABNORMAL LOW (ref 80.0–100.0)
Monocytes Absolute: 0.5 10*3/uL (ref 0.1–1.0)
Monocytes Relative: 10 %
Neutro Abs: 3.4 10*3/uL (ref 1.7–7.7)
Neutrophils Relative %: 73 %
Platelet Count: 156 10*3/uL (ref 150–400)
RBC: 6.77 MIL/uL — ABNORMAL HIGH (ref 4.22–5.81)
RDW: 22.5 % — ABNORMAL HIGH (ref 11.5–15.5)
WBC Count: 4.6 10*3/uL (ref 4.0–10.5)
nRBC: 0 % (ref 0.0–0.2)

## 2024-04-28 NOTE — Progress Notes (Signed)
 Patient presented for therapeutic phlebotomy. Patients HCT was 47.5 today. Per Drs orders phlebotomy not due at this time. Copy of labs given to patient. Patient voiced understanding. Ambulatory to the lobby.

## 2024-05-10 NOTE — Progress Notes (Signed)
  Radiation Oncology         [phone] ________________________________  Name: Sergio [name] MRN: [id number]  Date of Service: 05/16/2024  DOB: 1951-06-09  Post Treatment Telephone Note  Diagnosis: Stage T1c adenocarcinoma of the prostate with Gleason score of 3+4, and PSA of 5.26 (as documented in provider EOT note)  Pre Treatment IPSS Score: 8 (as documented in the provider consult note)  The patient was available for call today.   Symptoms of fatigue have improved since completing therapy.  Symptoms of bladder changes have improved since completing therapy. Current symptoms include none, and medications for bladder symptoms include Silodosin .  Symptoms of bowel changes have improved since completing therapy. Current symptoms include none, and medications for bowel symptoms include none.   Post Treatment IPSS Score: IPSS Questionnaire (AUA-7): Over the past month.   1)  How often have you had a sensation of not emptying your bladder completely after you finish urinating?  0 - Not at all  2)  How often have you had to urinate again less than two hours after you finished urinating? 5 - Almost always  3)  How often have you found you stopped and started again several times when you urinated?  0 - Not at all  4) How difficult have you found it to postpone urination?  2 - Less than half the time  5) How often have you had a weak urinary stream?  2 - Less than half the time  6) How often have you had to push or strain to begin urination?  1 - Less than 1 time in 5  7) How many times did you most typically get up to urinate from the time you went to bed until the time you got up in the morning?  2 - 2 times  Total score:  12. Which indicates moderate symptoms  0-7 mildly symptomatic   8-19 moderately symptomatic   20-35 severely symptomatic   Patient has a scheduled follow up visit with his urologist, Dr. Freddi Jaeger, on 06/2024 for ongoing surveillance. He was counseled that PSA levels  will be drawn in the urology office, and was reassured that additional time is expected to improve bowel and bladder symptoms. He was encouraged to call back with concerns or questions regarding radiation.    This concludes the interaction.  Avery Bodo, LPN

## 2024-05-13 ENCOUNTER — Other Ambulatory Visit: Payer: Self-pay | Admitting: Adult Health

## 2024-05-13 ENCOUNTER — Other Ambulatory Visit: Payer: Self-pay | Admitting: Family

## 2024-05-13 DIAGNOSIS — I10 Essential (primary) hypertension: Secondary | ICD-10-CM

## 2024-05-13 DIAGNOSIS — J029 Acute pharyngitis, unspecified: Secondary | ICD-10-CM

## 2024-05-16 ENCOUNTER — Ambulatory Visit
Admission: RE | Admit: 2024-05-16 | Discharge: 2024-05-16 | Disposition: A | Discharge status: Home / Self Care | Source: Ambulatory Visit | Attending: Family | Admitting: Family

## 2024-06-05 ENCOUNTER — Telehealth: Payer: Self-pay

## 2024-06-05 NOTE — Telephone Encounter (Signed)
 Mr Canelo called to confirm upcoming appts, asking why he is scheduled for 7/17. Per previous notes, it appears this appt was made in error. He also states he needs to r/s the 7/23 appts as he has other appts that day. 7/17 appts cancelled and message sent to scheduler to call pt to r/s 7/23 appts.

## 2024-06-22 ENCOUNTER — Ambulatory Visit: Payer: Medicare HMO | Admitting: Hematology and Oncology

## 2024-06-22 ENCOUNTER — Other Ambulatory Visit: Payer: Medicare HMO

## 2024-06-28 ENCOUNTER — Inpatient Hospital Stay

## 2024-07-03 ENCOUNTER — Other Ambulatory Visit: Payer: Self-pay | Admitting: Family

## 2024-07-03 ENCOUNTER — Inpatient Hospital Stay

## 2024-07-03 ENCOUNTER — Inpatient Hospital Stay: Attending: Hematology and Oncology

## 2024-07-03 DIAGNOSIS — D751 Secondary polycythemia: Secondary | ICD-10-CM | POA: Insufficient documentation

## 2024-07-03 DIAGNOSIS — I10 Essential (primary) hypertension: Secondary | ICD-10-CM

## 2024-07-03 LAB — CBC WITH DIFFERENTIAL (CANCER CENTER ONLY)
Abs Immature Granulocytes: 0.03 K/uL (ref 0.00–0.07)
Basophils Absolute: 0 K/uL (ref 0.0–0.1)
Basophils Relative: 0 %
Eosinophils Absolute: 0.2 K/uL (ref 0.0–0.5)
Eosinophils Relative: 3 %
HCT: 44.6 % (ref 39.0–52.0)
Hemoglobin: 15.8 g/dL (ref 13.0–17.0)
Immature Granulocytes: 1 %
Lymphocytes Relative: 15 %
Lymphs Abs: 0.8 K/uL (ref 0.7–4.0)
MCH: 28.2 pg (ref 26.0–34.0)
MCHC: 35.4 g/dL (ref 30.0–36.0)
MCV: 79.6 fL — ABNORMAL LOW (ref 80.0–100.0)
Monocytes Absolute: 0.5 K/uL (ref 0.1–1.0)
Monocytes Relative: 10 %
Neutro Abs: 3.8 K/uL (ref 1.7–7.7)
Neutrophils Relative %: 71 %
Platelet Count: 148 K/uL — ABNORMAL LOW (ref 150–400)
RBC: 5.6 MIL/uL (ref 4.22–5.81)
RDW: 18.4 % — ABNORMAL HIGH (ref 11.5–15.5)
WBC Count: 5.3 K/uL (ref 4.0–10.5)
nRBC: 0 % (ref 0.0–0.2)

## 2024-07-03 NOTE — Progress Notes (Signed)
 Patient here for therapeutic phlebotomy. Hct was bellow need. Patient did not need treatment. Gave a copy of lab work to patient. Ambulatory to lobby.

## 2024-08-12 ENCOUNTER — Other Ambulatory Visit: Payer: Self-pay | Admitting: Family

## 2024-08-12 DIAGNOSIS — I10 Essential (primary) hypertension: Secondary | ICD-10-CM

## 2024-08-18 ENCOUNTER — Ambulatory Visit: Admitting: Family

## 2024-08-18 ENCOUNTER — Encounter: Payer: Self-pay | Admitting: Family

## 2024-08-18 VITALS — BP 128/80 | HR 55 | Temp 97.7°F | Resp 20 | Ht 69.0 in | Wt 164.2 lb

## 2024-08-18 DIAGNOSIS — I10 Essential (primary) hypertension: Secondary | ICD-10-CM | POA: Diagnosis not present

## 2024-08-18 DIAGNOSIS — N529 Male erectile dysfunction, unspecified: Secondary | ICD-10-CM

## 2024-08-18 DIAGNOSIS — N400 Enlarged prostate without lower urinary tract symptoms: Secondary | ICD-10-CM | POA: Diagnosis not present

## 2024-08-18 DIAGNOSIS — E785 Hyperlipidemia, unspecified: Secondary | ICD-10-CM

## 2024-08-18 DIAGNOSIS — Z23 Encounter for immunization: Secondary | ICD-10-CM | POA: Diagnosis not present

## 2024-08-18 NOTE — Patient Instructions (Signed)
 1.Go to pharmacy to receive Shingles vaccines. 2.Stop at check out and schedule Annual Wellness Visit.

## 2024-08-18 NOTE — Progress Notes (Signed)
 Provider: Roxan Plough FNP-C   Sergio [name], Sergio BROCKS, NP  Patient Care Team: Bina Veenstra, Sergio BROCKS, NP as PCP - General (Family Medicine) Sergio Potts, MD as Consulting Physician (Hematology and Oncology) Sergio Pont, RN as Oncology Nurse Navigator  Extended Emergency Contact Information Primary Emergency Contact: Sergio [name],Sergio [name] Home Phone: [phone] Mobile Phone: [phone] Relation: Spouse Secondary Emergency Contact: Sergio [name],Sergio [name] Mobile Phone: [phone] Relation: Daughter  Code Status:  Full Code  Goals of care: Advanced Directive information    08/18/2024    9:27 AM  Advanced Directives  Does Patient Have a Medical Advance Directive? Yes  Type of Estate agent of [place];Living will  Does patient want to make changes to medical advance directive? No - Patient declined  Copy of Healthcare Power of Attorney in Chart? Yes - validated most recent copy scanned in chart (See row information)     Chief Complaint  Patient presents with   Medical Management of Chronic Issues    6 Month follow up.      Discussed the use of AI scribe software for clinical note transcription with the patient, who gave verbal consent to proceed.  History of Present Illness   Sergio [name] is a 73 year old male with a history of prostate cancer who presents for a six-month follow-up after completing radiation therapy.  He completed 28 sessions of radiation therapy for prostate cancer on Apr 17, 2024. Post-treatment, he has no urinary issues, maintains a good flow, and only wakes once at night to urinate. He experienced a urinary tract infection during radiation, requiring a Foley catheter, but this has resolved. His PSA levels have decreased from 5 to 0.72. He is currently on Rapaflo  8 mg daily and takes sildenafil  as needed for erectile dysfunction.  He has made dietary changes, reducing sweets and ice cream, resulting in weight loss from 173 to 164.2 pounds. He  engages in physical activity at work, inspired by a colleague who tracks steps, although he does not track his own. He takes a multivitamin, vitamin C, vitamin D, aspirin , lisinopril  20 mg, and hydrochlorothiazide  12.5 mg for blood pressure management. He occasionally checks his blood pressure at home and at work.  He reports no issues with constipation and uses Miralax  as needed, though he hasn't needed it recently. He experienced some bowel issues during radiation, which have since resolved.  He has a history of skin issues with itching and bumps, for which he sees a dermatologist. He wears a cap regularly but does not use sunscreen.  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 No issues with anxiety, depression, allergies, acid reflux, or pain in the back, knees, or legs. No numbness, tingling, or swelling in the legs.    Past Medical History:  Diagnosis Date   Benign localized prostatic hyperplasia with lower urinary tract symptoms (LUTS)    Erectile dysfunction    Hyperlipidemia    Hypertension    cardiology evaluation by Sergio [name] (lov in epic / released 07-22-2023)   NUC 10-20-2022  LR w/ normal perfusion and wall motion,  nuclear ef 58%;  echo 12-20-2021  ef 55%, mild LVH, G1DD, mild TR   Hypogonadism in male    Incomplete emptying of bladder    Malignant neoplasm prostate [facility]) 11/2023   primary urologist--- Sergio [name]/  radiation oncologist--- Sergio [name];   gleason 3+4,  psa 5.26,  vol 101  ml   OA (osteoarthritis)    Secondary erythrocytosis 2019   hematologsit--- Sergio [name];  [facility])  due to testosterone  replacement therapy for hypogonadism;  treated w/ phlebatomies since 12/ 2019   Wears contact lenses    Past Surgical History:  Procedure Laterality Date   COLONOSCOPY  2021   GOLD SEED IMPLANT N/A 02/25/2024   Procedure:  INSERTION, GOLD SEEDS;  Surgeon: Sergio [name]., MD;  [facility] OR;  Service: Urology;  Laterality: N/A;  /[facility]   RETINAL DETACHMENT SURGERY Right    1990s   SPACE OAR INSTILLATION N/A 02/25/2024   Procedure: INJECTION, HYDROGEL SPACER;  Surgeon: Sergio [name]., MD;  Location: [facility] OR;  Service: Urology;  Laterality: N/A;   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   Procedure: TOTAL KNEE ARTHROPLASTY;  Surgeon: Sergio Toribio LABOR, MD;  Location: [facility] ORS;  Service: Orthopedics;  Laterality: Right;   WISDOM TOOTH EXTRACTION      No Known Allergies  Allergies as of 08/18/2024   No Known Allergies      Medication List        Accurate as of August 18, 2024  6:03 PM. If you have any questions, ask your nurse or doctor.          ascorbic acid 500 MG tablet Commonly known as: VITAMIN C Take 500 mg by mouth daily.   aspirin  EC 81 MG tablet Take 81 mg by mouth daily. Swallow whole.   fluticasone  50 MCG/ACT nasal spray Commonly known as: FLONASE  SPRAY 2 SPRAYS INTO EACH NOSTRIL EVERY DAY What changed: See the new instructions.   hydrochlorothiazide  12.5 MG tablet Commonly known as: HYDRODIURIL  TAKE 1 TABLET BY MOUTH EVERY DAY   lisinopril  20 MG tablet Commonly known as: ZESTRIL  TAKE 1 TABLET BY MOUTH EVERY DAY   MIRALAX  PO Take by mouth as needed.   multivitamin tablet Take 1 tablet by mouth daily.   phenazopyridine  200 MG tablet Commonly known as: Pyridium  Take 1 tablet (200 mg total) by mouth 3 (three) times daily as needed for pain.   sildenafil  20 MG tablet Commonly known as: REVATIO  Take 1 tablet (20 mg total) by mouth as needed. What changed: reasons to take this   silodosin  8 MG Caps capsule Commonly known as: RAPAFLO  Take 1 capsule (8 mg total) by mouth daily with breakfast.   Vitamin D3 10 MCG (400 UNIT) Caps Take 400 Units by mouth daily.        Review of Systems  Constitutional:  Negative for appetite change, chills, fatigue,  fever and unexpected weight change.  HENT:  Negative for congestion, dental problem, ear discharge, ear pain, facial swelling, hearing loss, nosebleeds, postnasal drip, rhinorrhea, sinus pressure, sinus pain, sneezing, sore throat, tinnitus and trouble swallowing.   Eyes:  Negative for pain, discharge, redness, itching and visual disturbance.  Respiratory:  Negative for cough, chest tightness, shortness of breath and wheezing.   Cardiovascular:  Negative for chest pain, palpitations and leg swelling.  Gastrointestinal:  Negative for abdominal distention, abdominal pain, blood in stool, constipation, diarrhea, nausea and vomiting.  Endocrine: Negative for cold intolerance, heat intolerance, polydipsia, polyphagia and polyuria.  Genitourinary:  Negative for difficulty urinating, dysuria, flank pain, frequency and urgency.  Musculoskeletal:  Negative for arthralgias, back pain, gait problem, joint swelling, myalgias, neck pain and neck stiffness.  Skin:  Negative for color change, pallor, rash and wound.       AK on scalp  Neurological:  Negative for dizziness, syncope, speech difficulty, weakness, light-headedness, numbness and headaches.  Hematological:  Does not bruise/bleed easily.  Psychiatric/Behavioral:  Negative for agitation, behavioral problems, confusion, hallucinations, self-injury, sleep disturbance and suicidal ideas. The patient is not nervous/anxious.     Immunization History  Administered Date(s) Administered   Fluad Quad(high Dose 65+) 01/23/2022   Fluad Trivalent(High Dose 65+) 09/20/2023   INFLUENZA, HIGH DOSE SEASONAL PF 08/18/2024   Moderna Sars-Covid-2 Vaccination 12/11/2019, 01/08/2020   PNEUMOCOCCAL CONJUGATE-20 02/05/2023   Tdap 02/22/2023   Pertinent  Health Maintenance Due  Topic Date Due   Colonoscopy  10/18/2030   Influenza Vaccine  Completed      02/05/2023    8:55 AM 04/29/2023    3:12 PM 08/05/2023    1:32 PM 02/11/2024    8:48 AM 08/18/2024    9:25 AM   Fall Risk  Falls in the past year? 0 0 0 0 0  Was there an injury with Fall? 0 0  0 0  Fall Risk Category Calculator 0 0  0 0  Patient at Risk for Falls Due to No Fall Risks No Fall Risks  No Fall Risks No Fall Risks  Fall risk Follow up Falls evaluation completed   Falls evaluation completed Falls evaluation completed   Functional Status Survey:    Vitals:   08/18/24 0930  BP: 128/80  Pulse: (!) 55  Resp: 20  Temp: 97.7 F (36.5 C)  SpO2: 96%  Weight: 164 lb 3.2 oz (74.5 kg)  Height: 5' 9 (1.753 m)   Body mass index is 24.25 kg/m. Physical Exam  VITALS: T- 97.7, P- 55, BP- 128/80, SaO2- 96% MEASUREMENTS: Weight- 164.2. GENERAL: Alert, cooperative, well developed, no acute distress HEENT: Normocephalic, normal oropharynx, moist mucous membranes, tympanic membranes normal bilaterally, nose normal CHEST: Clear to auscultation bilaterally, no wheezes, rhonchi, or crackles CARDIOVASCULAR: Normal heart rate and rhythm, S1 and S2 normal without murmurs ABDOMEN: Soft, non-tender, non-distended, without organomegaly, normal bowel sounds EXTREMITIES: No cyanosis, edema, numbness, or tingling MUSCULOSKELETAL: Knees normal, hips normal range of motion, non-tender NEUROLOGICAL: Cranial nerves grossly intact, moves all extremities without gross motor or sensory deficit  SKIN: No rash or erythema. actinic keratosis on crown of scalp. PSYCHIATRY/BEHAVIORAL: Mood stable   Labs reviewed: Recent Labs    02/11/24 0941 02/25/24 1131 02/29/24 1120 03/28/24 0954  NA 134* 136 132* 128*  K 4.1 3.6 3.8 4.1  CL 99 98 100 95*  CO2 27  --  27 23  GLUCOSE 97 96 96 98  BUN 11 10 12 13   CREATININE 0.92 0.90 0.81 0.76  CALCIUM 9.4  --  9.2 9.1   Recent Labs    02/11/24 0941 02/29/24 1120 03/28/24 0954  AST 19 18 16   ALT 21 18 16   ALKPHOS  --  47  --   BILITOT 1.6* 1.1 1.0  PROT 6.9 6.9 6.3  ALBUMIN  --  4.3  --    Recent Labs    03/28/24 0954 04/28/24 0737 07/03/24 0742  WBC  6.1 4.6 5.3  NEUTROABS 4,892 3.4 3.8  HGB 16.1 16.4 15.8  HCT 50.3* 47.5 44.6  MCV 73.1* 70.2* 79.6*  PLT 167 156 148*   Lab Results  Component Value Date   TSH 1.11 03/28/2024   No results found for: HGBA1C Lab Results  Component Value Date   CHOL 126 03/28/2024   HDL 43 03/28/2024   LDLCALC 69 03/28/2024   TRIG 65 03/28/2024   CHOLHDL 2.9 03/28/2024  Significant Diagnostic Results in last 30 days:  No results found.  Assessment/Plan  Prostate cancer, status post radiation, under active surveillance Prostate cancer treated with radiation therapy, completed on Apr 17, 2024. PSA levels have decreased from 5 to 0.72, indicating a positive response to treatment. No urinary symptoms reported post-radiation. Continues under active surveillance with urologist follow-up scheduled for January. - Continue active surveillance with urologist - Follow up with urologist in January for PSA monitoring  Benign prostatic hyperplasia without lower urinary tract symptoms Benign prostatic hyperplasia managed with Rapaflo  8 mg. No lower urinary tract symptoms reported. - Continue Rapaflo  8 mg  Essential hypertension Blood pressure well-controlled at 128/80 mmHg. Currently on lisinopril  20 mg and hydrochlorothiazide  12.5 mg. - Continue lisinopril  20 mg - Continue hydrochlorothiazide  12.5 mg - Monitor blood pressure occasionally at home  Hyperlipidemia Lab work to be rechecked today to assess current status. - Order lipid panel as part of lab work  Constipation, resolved Constipation resolved. No current issues reported. Miralax  is available as needed but not currently required. - Use Miralax  as needed  Actinic keratosis (suspected, under dermatology care) Suspected actinic keratosis with recurring itchy bumps. Under dermatology care with scheduled follow-up. Advised to use sunscreen for protection. - Follow up with dermatologist as scheduled - Apply sunscreen when outdoors    Family/ staff Communication: Reviewed plan of care with patient verbalized understanding   Labs/tests ordered:  - CBC with Differential/Platelet - CMP with eGFR([facility]) - TSH - Lipid panel  Next Appointment : Return in about 6 months (around 02/15/2025) for medical mangement of chronic issues., Annual wellness visit soon.SABRA   Spent 30 minutes of Face to face and non-face to face with patient  >50% time spent counseling; reviewing medical record; tests; labs; documentation and developing future plan of care.   Sergio JAYSON Plough, NP

## 2024-08-19 LAB — COMPREHENSIVE METABOLIC PANEL WITH GFR
AG Ratio: 1.8 (calc) (ref 1.0–2.5)
ALT: 19 U/L (ref 9–46)
AST: 17 U/L (ref 10–35)
Albumin: 4.4 g/dL (ref 3.6–5.1)
Alkaline phosphatase (APISO): 51 U/L (ref 35–144)
BUN: 13 mg/dL (ref 7–25)
CO2: 27 mmol/L (ref 20–32)
Calcium: 9.4 mg/dL (ref 8.6–10.3)
Chloride: 98 mmol/L (ref 98–110)
Creat: 0.83 mg/dL (ref 0.70–1.28)
Globulin: 2.5 g/dL (ref 1.9–3.7)
Glucose, Bld: 96 mg/dL (ref 65–139)
Potassium: 3.8 mmol/L (ref 3.5–5.3)
Sodium: 130 mmol/L — ABNORMAL LOW (ref 135–146)
Total Bilirubin: 1.1 mg/dL (ref 0.2–1.2)
Total Protein: 6.9 g/dL (ref 6.1–8.1)
eGFR: 93 mL/min/1.73m2 (ref >=60)

## 2024-08-19 LAB — CBC WITH DIFFERENTIAL/PLATELET
Absolute Lymphocytes: 889 {cells}/uL (ref 850–3900)
Absolute Monocytes: 390 {cells}/uL (ref 200–950)
Basophils Absolute: 20 {cells}/uL (ref 0–200)
Basophils Relative: 0.5 %
Eosinophils Absolute: 129 {cells}/uL (ref 15–500)
Eosinophils Relative: 3.3 %
HCT: 51.2 % — ABNORMAL HIGH (ref 38.5–50.0)
Hemoglobin: 17.3 g/dL — ABNORMAL HIGH (ref 13.2–17.1)
MCH: 30.4 pg (ref 27.0–33.0)
MCHC: 33.8 g/dL (ref 32.0–36.0)
MCV: 90 fL (ref 80.0–100.0)
MPV: 11.7 fL (ref 7.5–12.5)
Monocytes Relative: 10 %
Neutro Abs: 2473 {cells}/uL (ref 1500–7800)
Neutrophils Relative %: 63.4 %
Platelets: 149 Thousand/uL (ref 140–400)
RBC: 5.69 Million/uL (ref 4.20–5.80)
RDW: 13.7 % (ref 11.0–15.0)
Total Lymphocyte: 22.8 %
WBC: 3.9 Thousand/uL (ref 3.8–10.8)

## 2024-08-19 LAB — LIPID PANEL
Cholesterol: 197 mg/dL (ref <200)
HDL: 52 mg/dL (ref >=40)
LDL Cholesterol (Calc): 115 mg/dL — ABNORMAL HIGH
Non-HDL Cholesterol (Calc): 145 mg/dL — ABNORMAL HIGH (ref <130)
Total CHOL/HDL Ratio: 3.8 (calc) (ref <5.0)
Triglycerides: 183 mg/dL — ABNORMAL HIGH (ref <150)

## 2024-08-19 LAB — TSH: TSH: 1.28 m[IU]/L (ref 0.40–4.50)

## 2024-08-25 ENCOUNTER — Ambulatory Visit: Payer: Self-pay | Admitting: Family

## 2024-08-29 ENCOUNTER — Inpatient Hospital Stay

## 2024-09-04 ENCOUNTER — Inpatient Hospital Stay

## 2024-09-04 ENCOUNTER — Inpatient Hospital Stay: Attending: Hematology and Oncology

## 2024-09-04 DIAGNOSIS — D751 Secondary polycythemia: Secondary | ICD-10-CM | POA: Insufficient documentation

## 2024-09-04 LAB — CBC WITH DIFFERENTIAL (CANCER CENTER ONLY)
Abs Immature Granulocytes: 0.02 K/uL (ref 0.00–0.07)
Basophils Absolute: 0 K/uL (ref 0.0–0.1)
Basophils Relative: 1 %
Eosinophils Absolute: 0.1 K/uL (ref 0.0–0.5)
Eosinophils Relative: 3 %
HCT: 48.2 % (ref 39.0–52.0)
Hemoglobin: 17.6 g/dL — ABNORMAL HIGH (ref 13.0–17.0)
Immature Granulocytes: 1 %
Lymphocytes Relative: 21 %
Lymphs Abs: 0.9 K/uL (ref 0.7–4.0)
MCH: 30.4 pg (ref 26.0–34.0)
MCHC: 36.5 g/dL — ABNORMAL HIGH (ref 30.0–36.0)
MCV: 83.2 fL (ref 80.0–100.0)
Monocytes Absolute: 0.4 K/uL (ref 0.1–1.0)
Monocytes Relative: 10 %
Neutro Abs: 2.8 K/uL (ref 1.7–7.7)
Neutrophils Relative %: 64 %
Platelet Count: 156 K/uL (ref 150–400)
RBC: 5.79 MIL/uL (ref 4.22–5.81)
RDW: 13.2 % (ref 11.5–15.5)
WBC Count: 4.4 K/uL (ref 4.0–10.5)
nRBC: 0 % (ref 0.0–0.2)

## 2024-09-04 NOTE — Progress Notes (Signed)
 Per Dr. Gudena, patient does not require therapeutic phlebotomy today for Hct 48.2.  Goal Hct is below 50.

## 2024-10-25 ENCOUNTER — Ambulatory Visit: Payer: Self-pay

## 2024-10-25 ENCOUNTER — Emergency Department (EMERGENCY_DEPARTMENT_HOSPITAL): Admit: 2024-10-25 | Discharge: 2024-10-25 | Disposition: A | Discharge status: Home / Self Care

## 2024-10-25 ENCOUNTER — Emergency Department (HOSPITAL_COMMUNITY)
Admission: EM | Admit: 2024-10-25 | Discharge: 2024-10-25 | Disposition: A | Discharge status: Home / Self Care | Source: Ambulatory Visit | Attending: Emergency Medicine | Admitting: Emergency Medicine

## 2024-10-25 ENCOUNTER — Other Ambulatory Visit: Payer: Self-pay

## 2024-10-25 DIAGNOSIS — Z7982 Long term (current) use of aspirin: Secondary | ICD-10-CM | POA: Insufficient documentation

## 2024-10-25 DIAGNOSIS — Z79899 Other long term (current) drug therapy: Secondary | ICD-10-CM | POA: Insufficient documentation

## 2024-10-25 DIAGNOSIS — I10 Essential (primary) hypertension: Secondary | ICD-10-CM | POA: Diagnosis not present

## 2024-10-25 DIAGNOSIS — M7989 Other specified soft tissue disorders: Secondary | ICD-10-CM

## 2024-10-25 DIAGNOSIS — Z96651 Presence of right artificial knee joint: Secondary | ICD-10-CM | POA: Diagnosis not present

## 2024-10-25 DIAGNOSIS — R600 Localized edema: Secondary | ICD-10-CM | POA: Diagnosis not present

## 2024-10-25 DIAGNOSIS — M79661 Pain in right lower leg: Secondary | ICD-10-CM | POA: Insufficient documentation

## 2024-10-25 LAB — COMPREHENSIVE METABOLIC PANEL WITH GFR
ALT: 20 U/L (ref 0–44)
AST: 24 U/L (ref 15–41)
Albumin: 4.1 g/dL (ref 3.5–5.0)
Alkaline Phosphatase: 71 U/L (ref 38–126)
Anion gap: 10 (ref 5–15)
BUN: 13 mg/dL (ref 8–23)
CO2: 24 mmol/L (ref 22–32)
Calcium: 9.3 mg/dL (ref 8.9–10.3)
Chloride: 99 mmol/L (ref 98–111)
Creatinine, Ser: 0.81 mg/dL (ref 0.61–1.24)
GFR, Estimated: >60 mL/min (ref >60)
Glucose, Bld: 132 mg/dL — ABNORMAL HIGH (ref 70–99)
Potassium: 3.6 mmol/L (ref 3.5–5.1)
Sodium: 133 mmol/L — ABNORMAL LOW (ref 135–145)
Total Bilirubin: 1.4 mg/dL — ABNORMAL HIGH (ref 0.0–1.2)
Total Protein: 6.7 g/dL (ref 6.5–8.1)

## 2024-10-25 LAB — CBC
HCT: 46.3 % (ref 39.0–52.0)
Hemoglobin: 16.3 g/dL (ref 13.0–17.0)
MCH: 30.2 pg (ref 26.0–34.0)
MCHC: 35.2 g/dL (ref 30.0–36.0)
MCV: 85.9 fL (ref 80.0–100.0)
Platelets: 171 K/uL (ref 150–400)
RBC: 5.39 MIL/uL (ref 4.22–5.81)
RDW: 13.4 % (ref 11.5–15.5)
WBC: 5.5 K/uL (ref 4.0–10.5)
nRBC: 0 % (ref 0.0–0.2)

## 2024-10-25 NOTE — Telephone Encounter (Signed)
 Please Advise and see Notes from triage Nurse

## 2024-10-25 NOTE — ED Provider Triage Note (Signed)
 Emergency Medicine Provider Triage Evaluation Note  ERIK BURKETT , a 73 y.o. male  was evaluated in triage.  Pt complains of R calf pain Sunday and was limping by Monday. Has worsened. Feels like his skin is stiff.  Review of Systems  Positive: Calf pain Negative: Fever   Physical Exam  BP 128/83   Pulse 93   Temp 98.1 F (36.7 C)   Resp 18   SpO2 96%  Gen:   Awake, no distress   Resp:  Normal effort  MSK:   Moves extremities without difficulty  Other:  DP pulse palpable, nml sensation  Medical Decision Making  Medically screening exam initiated at 2:58 PM.  Appropriate orders placed.  Elsie JAYSON Bloch was informed that the remainder of the evaluation will be completed by another provider, this initial triage assessment does not replace that evaluation, and the importance of remaining in the ED until their evaluation is complete.  DVT study and labs   Neldon Hamp RAMAN, [place] 10/25/24 1459

## 2024-10-25 NOTE — Discharge Instructions (Signed)
 As we discussed, your ultrasound showed no blood clots and your labs are normal today  Please keep your leg elevated and stay off your leg   See your doctor for follow-up  Return to ER if you have worse leg swelling or calf pain

## 2024-10-25 NOTE — Progress Notes (Signed)
 Right lower extremity venous duplex has been completed. Preliminary results can be found in CV Proc through chart review.  Results were given to [facility].  10/25/24 3:18 PM Cathlyn Collet RVT

## 2024-10-25 NOTE — ED Provider Notes (Signed)
 [place] EMERGENCY DEPARTMENT AT [facility] Provider Note   CSN: [id number] Arrival date & time: 10/25/24  1413     Patient presents with: Leg Swelling   BRONISLAW SWITZER is a 73 y.o. male history of arthritis with right knee replacement, hypertension here presenting with knee swelling and calf swelling.  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  He states that he noticed his right knee became swollen and then he had right calf pain.  Patient was sent in to rule out DVT.  Denies any chest pain shortness of breath.   The history is provided by the patient.       Prior to Admission medications   Medication Sig Start Date End Date Taking? Authorizing Provider  ascorbic acid (VITAMIN C) 500 MG tablet Take 500 mg by mouth daily.    [provider]  aspirin  EC 81 MG tablet Take 81 mg by mouth daily. Swallow whole.    [provider]  Cholecalciferol (VITAMIN D3) 10 MCG (400 UNIT) CAPS Take 400 Units by mouth daily.    [provider]  fluticasone  (FLONASE ) 50 MCG/ACT nasal spray SPRAY 2 SPRAYS INTO EACH NOSTRIL EVERY DAY Patient taking differently: as needed. 05/15/24   Medina-Vargas, Monina C, NP  hydrochlorothiazide  (HYDRODIURIL ) 12.5 MG tablet TAKE 1 TABLET BY MOUTH EVERY DAY 08/14/24   Ngetich, Dinah C, NP  lisinopril  (ZESTRIL ) 20 MG tablet TAKE 1 TABLET BY MOUTH EVERY DAY 07/03/24   Ngetich, Dinah C, NP  Multiple Vitamin (MULTIVITAMIN) tablet Take 1 tablet by mouth daily.    [provider]  phenazopyridine  (PYRIDIUM ) 200 MG tablet Take 1 tablet (200 mg total) by mouth 3 (three) times daily as needed for pain. 03/24/24   Patrcia Cough, MD  Polyethylene Glycol 3350  (MIRALAX  PO) Take by mouth as needed.    [provider]  sildenafil  (REVATIO ) 20 MG tablet Take 1 tablet (20 mg total) by mouth as needed. Patient taking differently: Take 20 mg by mouth as needed (ED). 05/21/21   Odean Potts, MD   silodosin  (RAPAFLO ) 8 MG CAPS capsule Take 1 capsule (8 mg total) by mouth daily with breakfast. Patient taking differently: Take 8 mg by mouth daily with breakfast. 11/20/20   Odean Potts, MD    Allergies: Patient has no known allergies.    Review of Systems  Musculoskeletal:  Positive for joint swelling.  All other systems reviewed and are negative.   Updated Vital Signs BP 128/83   Pulse 93   Temp 98.1 F (36.7 C)   Resp 18   SpO2 96%   Physical Exam Vitals and nursing note reviewed.  HENT:     Head: Normocephalic.     Nose: Nose normal.     Mouth/Throat:     Mouth: Mucous membranes are moist.  Eyes:     Pupils: Pupils are equal, round, and reactive to light.  Cardiovascular:     Rate and Rhythm: Normal rate.     Pulses: Normal pulses.  Pulmonary:     Effort: Pulmonary effort is normal.  Abdominal:     General: Abdomen is flat.  Musculoskeletal:     Cervical back: Normal range of motion.     Comments: Patient has right knee replacement with minimal swelling.  No obvious septic joint.  Patient has 1+ edema with no obvious cellulitis.  Neurological:     General: No focal deficit present.     Mental  Status: He is alert and oriented to person, place, and time.  Psychiatric:        Mood and Affect: Mood normal.        Behavior: Behavior normal.     (all labs ordered are listed, but only abnormal results are displayed) Labs Reviewed  COMPREHENSIVE METABOLIC PANEL WITH GFR - Abnormal; Notable for the following components:      Result Value   Sodium 133 (*)    Glucose, Bld 132 (*)    Total Bilirubin 1.4 (*)    All other components within normal limits  CBC    EKG: None  Radiology: VAS US  LOWER EXTREMITY VENOUS (DVT) (7a-7p) Result Date: 10/25/2024  Lower Venous DVT Study Patient Name:  AITHAN FARRELLY  Date of Exam:   10/25/2024 Medical Rec #: [id number]         Accession #:    [id number] Date of Birth: May 21, 1951         Patient Gender: M Patient Age:    63 years Exam Location:  [facility] Procedure:      VAS US  LOWER EXTREMITY VENOUS (DVT) Referring Phys: HAMP FONDAW --------------------------------------------------------------------------------  Indications: Swelling.  Risk Factors: None identified. Comparison Study: No prior studies. Performing Technologist: Cordella Collet RVT  Examination Guidelines: A complete evaluation includes B-mode imaging, spectral Doppler, color Doppler, and power Doppler as needed of all accessible portions of each vessel. Bilateral testing is considered an integral part of a complete examination. Limited examinations for reoccurring indications may be performed as noted. The reflux portion of the exam is performed with the patient in reverse Trendelenburg.  +---------+---------------+---------+-----------+----------+--------------+ RIGHT    CompressibilityPhasicitySpontaneityPropertiesThrombus Aging +---------+---------------+---------+-----------+----------+--------------+ CFV      Full           Yes      Yes                                 +---------+---------------+---------+-----------+----------+--------------+ SFJ      Full                                                        +---------+---------------+---------+-----------+----------+--------------+ FV Prox  Full                                                        +---------+---------------+---------+-----------+----------+--------------+ FV Mid   Full                                                        +---------+---------------+---------+-----------+----------+--------------+ FV DistalFull                                                        +---------+---------------+---------+-----------+----------+--------------+ PFV      Full                                                        +---------+---------------+---------+-----------+----------+--------------+  POP      Full           Yes      Yes                                  +---------+---------------+---------+-----------+----------+--------------+ PTV      Full                                                        +---------+---------------+---------+-----------+----------+--------------+ PERO     Full                                                        +---------+---------------+---------+-----------+----------+--------------+   +----+---------------+---------+-----------+----------+--------------+ LEFTCompressibilityPhasicitySpontaneityPropertiesThrombus Aging +----+---------------+---------+-----------+----------+--------------+ CFV Full           Yes      Yes                                 +----+---------------+---------+-----------+----------+--------------+     Summary: RIGHT: - There is no evidence of deep vein thrombosis in the lower extremity.  - A cystic structure is found in the popliteal fossa.  LEFT: - No evidence of common femoral vein obstruction.   *See table(s) above for measurements and observations. Electronically signed by Lonni Gaskins MD on 10/25/2024 at 3:30:10 PM.    Final      Procedures   Medications Ordered in the ED - No data to display                                  Medical Decision Making COLLIN RENGEL is a 73 y.o. male here presenting with right knee and leg swelling.  No signs of cellulitis.  Likely dependent edema.  I reviewed labs and ultrasound ordered in triage.  CBC and BMP unremarkable.  Ultrasound did not show any DVT.  I think likely dependent edema.  Told him to stay off his feet and elevate his right foot.  Stable for discharge   Problems Addressed: Right leg swelling: acute illness or injury  Amount and/or Complexity of Data Reviewed Labs: ordered. Decision-making details documented in ED Course. Radiology: ordered and independent interpretation performed. Decision-making details documented in ED Course.     Final diagnoses:  None    ED  Discharge Orders     None          Patt Alm Macho, MD 10/25/24 [phone]

## 2024-10-25 NOTE — Telephone Encounter (Signed)
 Please go to ED to evaluate symptoms.

## 2024-10-25 NOTE — Telephone Encounter (Addendum)
 This RN called [facility] to advise that pt was advised to go to ED now, but he stated he will go later this morning. Ortencia stated she will advise PCP now.  Pt stated he had originally planned to wait to see PCP Monday.  FYI Only or Action Required?: Action required by provider: update on patient condition.  Patient was last seen in primary care on 08/18/2024 by Ngetich, Roxan BROCKS, NP.  Called Nurse Triage reporting Leg Pain.  Symptoms began several days ago.  Interventions attempted: Rest, hydration, or home remedies.  Symptoms are: rapidly worsening.  Triage Disposition: Go to ED Now (overriding See HCP Within 4 Hours (Or PCP Triage))  Patient/caregiver understands and will follow disposition?: No, refuses disposition Reason for Disposition  [1] Thigh or calf pain AND [2] only 1 side AND [3] present > 1 hour  (Exception: Chronic unchanged pain.)  Answer Assessment - Initial Assessment Questions Reports right calf pain with edema, warm to the touch. 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 Denies SOB or CP. Denies discoloration to the skin.  Pt agreed to go to the ED, but states he has to take his wife to her Dr. Tomasa first. This RN reviewed rationale for immediate evaluation, pt refused. Repeated agreement to go to the ED later this morning.   Advised not to rub the area and to call 911 if SOB or CP occurs.  1. ONSET: When did the pain start?      08/22/24 2. LOCATION: Where is the pain located?      Right side of right calf 3. PAIN: How bad is the pain?    (Scale 1-10; or mild, moderate, severe)     2/10 4. WORK OR EXERCISE: Has there been any recent work or exercise that involved this part of the body?      Works on an ambulance 5. CAUSE: What do you think is causing the leg pain?     Poss DVT 6. OTHER SYMPTOMS: Do you have any other symptoms? (e.g., chest pain, back pain, breathing difficulty, swelling, rash, fever, numbness, weakness)     Denies  Protocols  used: Leg Pain-A-AH Copied from CRM #[id number]. Topic: Clinical - Red Word Triage >> Oct 25, 2024  8:39 AM Graeme ORN wrote: Red Word that prompted transfer to Nurse Triage: Right calf swollen, sore, warm - maybe possible blood clot  Past Medical History:  Diagnosis Date   Benign localized prostatic hyperplasia with lower urinary tract symptoms (LUTS)    Erectile dysfunction    Hyperlipidemia    Hypertension    cardiology evaluation by dr elmira (lov in epic / released 07-22-2023)   NUC 10-20-2022  LR w/ normal perfusion and wall motion,  nuclear ef 58%;  echo 12-20-2021  ef 55%, mild LVH, G1DD, mild TR   Hypogonadism in male    Incomplete emptying of bladder    Malignant neoplasm prostate (HCC) 11/2023   primary urologist--- dr gay/  radiation oncologist--- dr patrcia;   gleason 3+4,  psa 5.26,  vol 101 ml   OA (osteoarthritis)    Secondary erythrocytosis 2019   hematologsit--- dr odean;  [facility]  due to testosterone  replacement therapy for hypogonadism;  treated w/ phlebatomies since 12/ 2019   Wears contact lenses

## 2024-10-25 NOTE — ED Triage Notes (Signed)
 Pt has c/o right leg swelling and pain, PCP sent pt to r/o DVT

## 2024-10-27 NOTE — Telephone Encounter (Signed)
 Patient was seen in the ER on 10/25/24 Please see notes from ED visit.    Message sent to Ngetich, Dinah C, NP

## 2024-10-30 ENCOUNTER — Inpatient Hospital Stay: Attending: Hematology and Oncology

## 2024-10-30 ENCOUNTER — Inpatient Hospital Stay

## 2024-10-30 DIAGNOSIS — D751 Secondary polycythemia: Secondary | ICD-10-CM | POA: Insufficient documentation

## 2024-10-30 LAB — CBC WITH DIFFERENTIAL (CANCER CENTER ONLY)
Abs Immature Granulocytes: 0.03 K/uL (ref 0.00–0.07)
Basophils Absolute: 0 K/uL (ref 0.0–0.1)
Basophils Relative: 0 %
Eosinophils Absolute: 0.2 K/uL (ref 0.0–0.5)
Eosinophils Relative: 4 %
HCT: 43 % (ref 39.0–52.0)
Hemoglobin: 15.7 g/dL (ref 13.0–17.0)
Immature Granulocytes: 1 %
Lymphocytes Relative: 16 %
Lymphs Abs: 0.9 K/uL (ref 0.7–4.0)
MCH: 30.5 pg (ref 26.0–34.0)
MCHC: 36.5 g/dL — ABNORMAL HIGH (ref 30.0–36.0)
MCV: 83.5 fL (ref 80.0–100.0)
Monocytes Absolute: 0.5 K/uL (ref 0.1–1.0)
Monocytes Relative: 9 %
Neutro Abs: 3.8 K/uL (ref 1.7–7.7)
Neutrophils Relative %: 70 %
Platelet Count: 159 K/uL (ref 150–400)
RBC: 5.15 MIL/uL (ref 4.22–5.81)
RDW: 13.2 % (ref 11.5–15.5)
WBC Count: 5.4 K/uL (ref 4.0–10.5)
nRBC: 0 % (ref 0.0–0.2)

## 2024-10-30 NOTE — Progress Notes (Signed)
 Patient here for therapeutic phlebotomy. Hct was below need. Patient did not need treatment. Gave a copy of lab work to patient. Discharged home with no further questions.

## 2024-11-24 ENCOUNTER — Ambulatory Visit: Admitting: Family

## 2024-11-28 ENCOUNTER — Ambulatory Visit: Admitting: Family

## 2024-12-04 ENCOUNTER — Encounter: Payer: Self-pay | Admitting: Family

## 2024-12-04 ENCOUNTER — Ambulatory Visit (INDEPENDENT_AMBULATORY_CARE_PROVIDER_SITE_OTHER): Payer: Self-pay | Admitting: Family

## 2024-12-04 VITALS — BP 132/74 | HR 62 | Temp 97.9°F | Ht 63.0 in | Wt 170.2 lb

## 2024-12-04 DIAGNOSIS — Z0000 Encounter for general adult medical examination without abnormal findings: Secondary | ICD-10-CM | POA: Diagnosis not present

## 2024-12-04 NOTE — Patient Instructions (Signed)
 [name] , Thank you for taking time to come for your Medicare Wellness Visit. I appreciate your ongoing commitment to your health goals. Please review the following plan we discussed and let me know if I can assist you in the future.   Screening recommendations/referrals: Colonoscopy : Up to date  Recommended yearly ophthalmology/optometry visit for glaucoma screening and checkup Recommended yearly dental visit for hygiene and checkup  Vaccinations: Influenza vaccine due annually in September/October Pneumococcal vaccine  : Up to date  Tdap vaccine  : Up to date  Shingles vaccine :Due     Advanced directives:   Conditions/risks identified:   Next appointment: 1 year   Preventive Care 22 Years and Older, Male Preventive care refers to lifestyle choices and visits with your health care provider that can promote health and wellness. What does preventive care include? A yearly physical exam. This is also called an annual well check. Dental exams once or twice a year. Routine eye exams. Ask your health care provider how often you should have your eyes checked. Personal lifestyle choices, including: Daily care of your teeth and gums. Regular physical activity. Eating a healthy diet. Avoiding tobacco and drug use. Limiting alcohol use. Practicing safe sex. Taking low doses of aspirin  every day. Taking vitamin and mineral supplements as recommended by your health care provider. What happens during an annual well check? The services and screenings done by your health care provider during your annual well check will depend on your age, overall health, lifestyle risk factors, and family history of disease. Counseling  Your health care provider may ask you questions about your: Alcohol use. Tobacco use. Drug use. Emotional well-being. Home and relationship well-being. Sexual activity. Eating habits. History of falls. Memory and ability to understand (cognition). Work and work  astronomer. Screening  You may have the following tests or measurements: Height, weight, and BMI. Blood pressure. Lipid and cholesterol levels. These may be checked every 5 years, or more frequently if you are over 54 years old. Skin check. Lung cancer screening. You may have this screening every year starting at age 37 if you have a 30-pack-year history of smoking and currently smoke or have quit within the past 15 years. Fecal occult blood test (FOBT) of the stool. You may have this test every year starting at age 62. Flexible sigmoidoscopy or colonoscopy. You may have a sigmoidoscopy every 5 years or a colonoscopy every 10 years starting at age 35. Prostate cancer screening. Recommendations will vary depending on your family history and other risks. Hepatitis C blood test. Hepatitis B blood test. Sexually transmitted disease (STD) testing. Diabetes screening. This is done by checking your blood sugar (glucose) after you have not eaten for a while (fasting). You may have this done every 1-3 years. Abdominal aortic aneurysm (AAA) screening. You may need this if you are a current or former smoker. Osteoporosis. You may be screened starting at age 10 if you are at high risk. Talk with your health care provider about your test results, treatment options, and if necessary, the need for more tests. Vaccines  Your health care provider may recommend certain vaccines, such as: Influenza vaccine. This is recommended every year. Tetanus, diphtheria, and acellular pertussis (Tdap, Td) vaccine. You may need a Td booster every 10 years. Zoster vaccine. You may need this after age 62. Pneumococcal 13-valent conjugate (PCV13) vaccine. One dose is recommended after age 40. Pneumococcal polysaccharide (PPSV23) vaccine. One dose is recommended after age 52. Talk to your health  care provider about which screenings and vaccines you need and how often you need them. This information is not intended to replace  advice given to you by your health care provider. Make sure you discuss any questions you have with your health care provider. Document Released: 12/20/2015 Document Revised: 08/12/2016 Document Reviewed: 09/24/2015 Elsevier Interactive Patient Education  2017 [employer].  Fall Prevention in the Home Falls can cause injuries. They can happen to people of all ages. There are many things you can do to make your home safe and to help prevent falls. What can I do on the outside of my home? Regularly fix the edges of walkways and driveways and fix any cracks. Remove anything that might make you trip as you walk through a door, such as a raised step or threshold. Trim any bushes or trees on the path to your home. Use bright outdoor lighting. Clear any walking paths of anything that might make someone trip, such as rocks or tools. Regularly check to see if handrails are loose or broken. Make sure that both sides of any steps have handrails. Any raised decks and porches should have guardrails on the edges. Have any leaves, snow, or ice cleared regularly. Use sand or salt on walking paths during winter. Clean up any spills in your garage right away. This includes oil or grease spills. What can I do in the bathroom? Use night lights. Install grab bars by the toilet and in the tub and shower. Do not use towel bars as grab bars. Use non-skid mats or decals in the tub or shower. If you need to sit down in the shower, use a plastic, non-slip stool. Keep the floor dry. Clean up any water  that spills on the floor as soon as it happens. Remove soap buildup in the tub or shower regularly. Attach bath mats securely with double-sided non-slip rug tape. Do not have throw rugs and other things on the floor that can make you trip. What can I do in the bedroom? Use night lights. Make sure that you have a light by your bed that is easy to reach. Do not use any sheets or blankets that are too big for your bed.  They should not hang down onto the floor. Have a firm chair that has side arms. You can use this for support while you get dressed. Do not have throw rugs and other things on the floor that can make you trip. What can I do in the kitchen? Clean up any spills right away. Avoid walking on wet floors. Keep items that you use a lot in easy-to-reach places. If you need to reach something above you, use a strong step stool that has a grab bar. Keep electrical cords out of the way. Do not use floor polish or wax that makes floors slippery. If you must use wax, use non-skid floor wax. Do not have throw rugs and other things on the floor that can make you trip. What can I do with my stairs? Do not leave any items on the stairs. Make sure that there are handrails on both sides of the stairs and use them. Fix handrails that are broken or loose. Make sure that handrails are as long as the stairways. Check any carpeting to make sure that it is firmly attached to the stairs. Fix any carpet that is loose or worn. Avoid having throw rugs at the top or bottom of the stairs. If you do have throw rugs, attach them to  the floor with carpet tape. Make sure that you have a light switch at the top of the stairs and the bottom of the stairs. If you do not have them, ask someone to add them for you. What else can I do to help prevent falls? Wear shoes that: Do not have high heels. Have rubber bottoms. Are comfortable and fit you well. Are closed at the toe. Do not wear sandals. If you use a stepladder: Make sure that it is fully opened. Do not climb a closed stepladder. Make sure that both sides of the stepladder are locked into place. Ask someone to hold it for you, if possible. Clearly mark and make sure that you can see: Any grab bars or handrails. First and last steps. Where the edge of each step is. Use tools that help you move around (mobility aids) if they are needed. These  include: Canes. Walkers. Scooters. Crutches. Turn on the lights when you go into a dark area. Replace any light bulbs as soon as they burn out. Set up your furniture so you have a clear path. Avoid moving your furniture around. If any of your floors are uneven, fix them. If there are any pets around you, be aware of where they are. Review your medicines with your doctor. Some medicines can make you feel dizzy. This can increase your chance of falling. Ask your doctor what other things that you can do to help prevent falls. This information is not intended to replace advice given to you by your health care provider. Make sure you discuss any questions you have with your health care provider. Document Released: 09/19/2009 Document Revised: 04/30/2016 Document Reviewed: 12/28/2014 Elsevier Interactive Patient Education  2017 [employer].

## 2024-12-04 NOTE — Progress Notes (Signed)
 "  Chief Complaint  Patient presents with   Annual Exam     Subjective:   Sergio [name] is a 73 y.o. male who presents for a [employer] Visit.  Visit info / Clinical Intake: Medicare Wellness Visit Type:: Subsequent Annual Wellness Visit Persons participating in visit and providing information:: patient Medicare Wellness Visit Mode:: In-person (required for WTM) Interpreter Needed?: No Pre-visit prep was completed: no AWV questionnaire completed by patient prior to visit?: no Living arrangements:: lives with spouse/significant other Patient's Overall Health Status Rating: very good Typical amount of pain: some Does pain affect daily life?: no Are you currently prescribed opioids?: no  Dietary Habits and Nutritional Risks How many meals a day?: (Patient-Rptd) 3 Eats fruit and vegetables daily?: (Patient-Rptd) yes Most meals are obtained by: (Patient-Rptd) preparing own meals; eating out; having others provide food  Functional Status Activities of Daily Living (to include ambulation/medication): (Patient-Rptd) Independent Ambulation: (Patient-Rptd) Independent Medication Administration: (Patient-Rptd) Dependent Home Management (perform basic housework or laundry): (Patient-Rptd) Independent Manage your own finances?: (Patient-Rptd) yes Primary transportation is: (Patient-Rptd) driving  Fall Screening Falls in the past year?: 0 Number of falls in past year: 0 Was there an injury with Fall?: 0 Fall Risk Category Calculator: 0 Patient Fall Risk Level: Low Fall Risk  Fall Risk Patient at Risk for Falls Due to: No Fall Risks Fall risk Follow up: Falls evaluation completed  Home and Transportation Safety: All rugs have non-skid backing?: (Patient-Rptd) yes All stairs or steps have railings?: (!) (Patient-Rptd) no Grab bars in the bathtub or shower?: (!) (Patient-Rptd) no Have non-skid surface in bathtub or shower?: (Patient-Rptd) yes Good home lighting?:  (Patient-Rptd) yes Regular seat belt use?: (Patient-Rptd) yes Hospital stays in the last year:: (Patient-Rptd) no  Cognitive Assessment Difficulty concentrating, remembering, or making decisions? : no Will 6CIT or Mini Cog be Completed: yes What year is it?: 0 points What month is it?: 0 points Give patient an address phrase to remember (5 components): [id number] [place] About what time is it?: 0 points Count backwards from 20 to 1: 0 points Say the months of the year in reverse: 0 points Repeat the address phrase from earlier: 0 points 6 CIT Score: 0 points  Advance Directives (For Healthcare) Does Patient Have a Medical Advance Directive?: Yes Does patient want to make changes to medical advance directive?: No - Patient declined Type of Advance Directive: Out of facility DNR (pink MOST or yellow form) Copy of Healthcare Power of Attorney in Chart?: Yes - validated most recent copy scanned in chart (See row information) Copy of Living Will in Chart?: Yes - validated most recent copy scanned in chart (See row information) Out of facility DNR (pink MOST or yellow form) in Chart? (Ambulatory ONLY): No - copy requested    Allergies (verified) Patient has no known allergies.   Current Medications (verified) Outpatient Encounter Medications as of 12/04/2024  Medication Sig   ascorbic acid (VITAMIN C) 500 MG tablet Take 500 mg by mouth daily.   aspirin  EC 81 MG tablet Take 81 mg by mouth daily. Swallow whole.   Cholecalciferol (VITAMIN D3) 10 MCG (400 UNIT) CAPS Take 400 Units by mouth daily.   fluticasone  (FLONASE ) 50 MCG/ACT nasal spray SPRAY 2 SPRAYS INTO EACH NOSTRIL EVERY DAY (Patient taking differently: as needed.)   hydrochlorothiazide  (HYDRODIURIL ) 12.5 MG tablet TAKE 1 TABLET BY MOUTH EVERY DAY   lisinopril  (ZESTRIL ) 20 MG tablet TAKE 1 TABLET BY MOUTH EVERY DAY  Multiple Vitamin (MULTIVITAMIN) tablet Take 1 tablet by mouth daily.   phenazopyridine  (PYRIDIUM ) 200 MG  tablet Take 1 tablet (200 mg total) by mouth 3 (three) times daily as needed for pain.   Polyethylene Glycol 3350  (MIRALAX  PO) Take by mouth as needed.   sildenafil  (REVATIO ) 20 MG tablet Take 1 tablet (20 mg total) by mouth as needed. (Patient taking differently: Take 20 mg by mouth as needed (ED).)   silodosin  (RAPAFLO ) 8 MG CAPS capsule Take 1 capsule (8 mg total) by mouth daily with breakfast.   No facility-administered encounter medications on file as of 12/04/2024.    History: Past Medical History:  Diagnosis Date   Benign localized prostatic hyperplasia with lower urinary tract symptoms (LUTS)    Erectile dysfunction    Hyperlipidemia    Hypertension    cardiology evaluation by dr elmira (lov in epic / released 07-22-2023)   NUC 10-20-2022  LR w/ normal perfusion and wall motion,  nuclear ef 58%;  echo 12-20-2021  ef 55%, mild LVH, G1DD, mild TR   Hypogonadism in male    Incomplete emptying of bladder    Malignant neoplasm prostate [facility]) 11/2023   primary urologist--- dr gay/  radiation oncologist--- dr patrcia;   gleason 3+4,  psa 5.26,  vol 101 ml   OA (osteoarthritis)    Secondary erythrocytosis 2019   hematologsit--- dr odean;  [facility]  due to testosterone  replacement therapy for hypogonadism;  treated w/ phlebatomies since 12/ 2019   Wears contact lenses    Past Surgical History:  Procedure Laterality Date   COLONOSCOPY  2021   GOLD SEED IMPLANT N/A 02/25/2024   Procedure: INSERTION, GOLD SEEDS;  Surgeon: Alvaro Ricardo KATHEE Mickey., MD;  Location: [facility] OR;  Service: Urology;  Laterality: N/A;  /[facility]   RETINAL DETACHMENT SURGERY Right    1990s   SPACE OAR INSTILLATION N/A 02/25/2024   Procedure: INJECTION, HYDROGEL SPACER;  Surgeon: Alvaro Ricardo KATHEE Mickey., MD;  Location: [facility] OR;  Service: Urology;  Laterality: N/A;   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   Procedure: TOTAL KNEE ARTHROPLASTY;  Surgeon: Edna Toribio LABOR, MD;  Location:  [facility];  Service: Orthopedics;  Laterality: Right;   WISDOM TOOTH EXTRACTION     Family History  Problem Relation Age of Onset   Heart failure Mother    Heart failure Father    Arthritis Sister    Arthritis Sister    Arthritis Son    Social History   Occupational History   Not on file  Tobacco Use   Smoking status: Never   Smokeless tobacco: Never  Vaping Use   Vaping status: Never Used  Substance and Sexual Activity   Alcohol use: Never   Drug use: Never   Sexual activity: Yes   Tobacco Counseling Counseling given: Not Answered  SDOH Screenings   Food Insecurity: No Food Insecurity (11/30/2024)  Housing: Unknown (11/30/2024)  Transportation Needs: No Transportation Needs (11/30/2024)  Utilities: Not At Risk (12/30/2023)  Alcohol Screen: Low Risk (12/30/2023)  Depression (PHQ2-9): Low Risk (12/04/2024)  Financial Resource Strain: Low Risk (11/30/2024)  Physical Activity: Unknown (11/30/2024)  Social Connections: Socially Integrated (11/30/2024)  Stress: No Stress Concern Present (11/30/2024)  Tobacco Use: Low Risk (12/04/2024)   See flowsheets for full screening details  Depression Screen PHQ 2 & 9 Depression Scale- Over the past 2 weeks, how often have you been bothered by any of the following problems? Little interest or pleasure in doing  things: 0 Feeling down, depressed, or hopeless (PHQ Adolescent also includes...irritable): 0 PHQ-2 Total Score: 0 Trouble falling or staying asleep, or sleeping too much: 0 Feeling tired or having little energy: 0 Poor appetite or overeating (PHQ Adolescent also includes...weight loss): 0 Feeling bad about yourself - or that you are a failure or have let yourself or your family down: 0 Trouble concentrating on things, such as reading the newspaper or watching television (PHQ Adolescent also includes...like school work): 0 Moving or speaking so slowly that other people could have noticed. Or the opposite - being so fidgety or  restless that you have been moving around a lot more than usual: 0 Thoughts that you would be better off dead, or of hurting yourself in some way: 0 PHQ-9 Total Score: 0 If you checked off any problems, how difficult have these problems made it for you to do your work, take care of things at home, or get along with other people?: Not difficult at all     Goals Addressed   None          Objective:    Today's Vitals   12/04/24 0848  BP: 132/74  Pulse: 62  Temp: 97.9 F (36.6 C)  TempSrc: Temporal  SpO2: 96%  Weight: 170 lb 3.2 oz (77.2 kg)  Height: 5' 3 (1.6 m)   Body mass index is 30.15 kg/m.  Hearing/Vision screen Hearing Screening - Comments:: No hearing issues Vision Screening - Comments:: Spring of 2025 is due in spring of 2026 Immunizations and Health Maintenance Health Maintenance  Topic Date Due   Medicare Annual Wellness (AWV)  Never done   Zoster Vaccines- Shingrix (1 of 2) Never done   COVID-19 Vaccine (3 - Moderna risk series) 11/03/2029 (Originally 02/05/2020)   Colonoscopy  10/18/2030   DTaP/Tdap/Td (2 - Td or Tdap) 02/21/2033   Pneumococcal Vaccine: 50+ Years  Completed   Influenza Vaccine  Completed   Hepatitis C Screening  Completed   Meningococcal B Vaccine  Aged Out        Assessment/Plan:  This is a routine wellness examination for Sergio [name].  Patient Care Team: Lesslie Mckeehan, Roxan BROCKS, NP as PCP - General (Family Medicine) Odean Potts, MD as Consulting Physician (Hematology and Oncology) Vertell Pont, RN as Oncology Nurse Navigator  I have personally reviewed and noted the following in the patients chart:   Medical and social history Use of alcohol, tobacco or illicit drugs  Current medications and supplements including opioid prescriptions. Functional ability and status Nutritional status Physical activity Advanced directives List of other physicians Hospitalizations, surgeries, and ER visits in previous 12 months Vitals Screenings to  include cognitive, depression, and falls Referrals and appointments  No orders of the defined types were placed in this encounter.  In addition, I have reviewed and discussed with patient certain preventive protocols, quality metrics, and best practice recommendations. A written personalized care plan for preventive services as well as general preventive health recommendations were provided to patient.   Roxan BROCKS Armeda Plumb, NP   12/04/2024   No follow-ups on file.  After Visit Summary: (In Person-Printed) AVS printed and given to the patient  Nurse Notes: up to date except for COVID-19 and Shingles vaccine.Advised to get vaccine the pharmacy  "

## 2024-12-29 ENCOUNTER — Inpatient Hospital Stay

## 2024-12-29 ENCOUNTER — Encounter: Payer: Self-pay | Admitting: Hematology and Oncology

## 2024-12-29 ENCOUNTER — Inpatient Hospital Stay: Attending: Hematology and Oncology

## 2024-12-29 DIAGNOSIS — D751 Secondary polycythemia: Secondary | ICD-10-CM

## 2024-12-29 LAB — CBC WITH DIFFERENTIAL (CANCER CENTER ONLY)
Abs Immature Granulocytes: 0.02 K/uL (ref 0.00–0.07)
Basophils Absolute: 0 K/uL (ref 0.0–0.1)
Basophils Relative: 0 %
Eosinophils Absolute: 0.1 K/uL (ref 0.0–0.5)
Eosinophils Relative: 2 %
HCT: 45.2 % (ref 39.0–52.0)
Hemoglobin: 16.4 g/dL (ref 13.0–17.0)
Immature Granulocytes: 0 %
Lymphocytes Relative: 17 %
Lymphs Abs: 0.8 K/uL (ref 0.7–4.0)
MCH: 30.1 pg (ref 26.0–34.0)
MCHC: 36.3 g/dL — ABNORMAL HIGH (ref 30.0–36.0)
MCV: 82.9 fL (ref 80.0–100.0)
Monocytes Absolute: 0.4 K/uL (ref 0.1–1.0)
Monocytes Relative: 9 %
Neutro Abs: 3.3 K/uL (ref 1.7–7.7)
Neutrophils Relative %: 72 %
Platelet Count: 157 K/uL (ref 150–400)
RBC: 5.45 MIL/uL (ref 4.22–5.81)
RDW: 12.9 % (ref 11.5–15.5)
WBC Count: 4.6 K/uL (ref 4.0–10.5)
nRBC: 0 % (ref 0.0–0.2)

## 2024-12-29 NOTE — Progress Notes (Signed)
 Patient presents to infusion today for phlebotomy. Hct goal < 50, patient's hct today is 45.2; does not meet parameters. Printed lab results for patient, informed that he does not meet parameters, pt agreeable and discharged home with no further questions.

## 2025-01-09 ENCOUNTER — Other Ambulatory Visit: Payer: Self-pay | Admitting: Family

## 2025-01-09 DIAGNOSIS — I10 Essential (primary) hypertension: Secondary | ICD-10-CM

## 2025-01-26 ENCOUNTER — Ambulatory Visit: Admitting: Family

## 2025-02-16 ENCOUNTER — Ambulatory Visit: Payer: Self-pay | Admitting: Family

## 2025-02-26 ENCOUNTER — Inpatient Hospital Stay

## 2025-02-26 ENCOUNTER — Inpatient Hospital Stay: Admitting: Hematology and Oncology

## 2025-02-26 ENCOUNTER — Inpatient Hospital Stay: Attending: Hematology and Oncology
